# Patient Record
Sex: Female | Born: 1950
Health system: Southern US, Community
[De-identification: ages and names within clinical notes are randomized; demographics above are authoritative.]

## PROBLEM LIST (undated history)

## (undated) DIAGNOSIS — Z800 Family history of malignant neoplasm of digestive organs: Secondary | ICD-10-CM

## (undated) DIAGNOSIS — K219 Gastro-esophageal reflux disease without esophagitis: Secondary | ICD-10-CM

## (undated) DIAGNOSIS — J45909 Unspecified asthma, uncomplicated: Secondary | ICD-10-CM

## (undated) DIAGNOSIS — B029 Zoster without complications: Secondary | ICD-10-CM

## (undated) DIAGNOSIS — Z801 Family history of malignant neoplasm of trachea, bronchus and lung: Secondary | ICD-10-CM

## (undated) DIAGNOSIS — F329 Major depressive disorder, single episode, unspecified: Secondary | ICD-10-CM

## (undated) DIAGNOSIS — C801 Malignant (primary) neoplasm, unspecified: Secondary | ICD-10-CM

## (undated) DIAGNOSIS — Z90710 Acquired absence of both cervix and uterus: Secondary | ICD-10-CM

## (undated) DIAGNOSIS — Z9049 Acquired absence of other specified parts of digestive tract: Secondary | ICD-10-CM

## (undated) DIAGNOSIS — R0600 Dyspnea, unspecified: Secondary | ICD-10-CM

## (undated) DIAGNOSIS — E785 Hyperlipidemia, unspecified: Secondary | ICD-10-CM

## (undated) DIAGNOSIS — M791 Myalgia, unspecified site: Secondary | ICD-10-CM

## (undated) DIAGNOSIS — R5383 Other fatigue: Secondary | ICD-10-CM

## (undated) DIAGNOSIS — I10 Essential (primary) hypertension: Secondary | ICD-10-CM

## (undated) DIAGNOSIS — F32A Depression, unspecified: Secondary | ICD-10-CM

## (undated) HISTORY — DX: Myalgia, unspecified site: M79.10

## (undated) HISTORY — DX: Hyperlipidemia, unspecified: E78.5

## (undated) HISTORY — DX: Major depressive disorder, single episode, unspecified: F32.9

## (undated) HISTORY — DX: Acquired absence of other specified parts of digestive tract: Z90.49

## (undated) HISTORY — DX: Depression, unspecified: F32.A

## (undated) HISTORY — PX: DIAGNOSTIC LAPAROSCOPY: SUR761

## (undated) HISTORY — DX: Essential (primary) hypertension: I10

## (undated) HISTORY — DX: Family history of malignant neoplasm of trachea, bronchus and lung: Z80.1

## (undated) HISTORY — PX: PARACENTESIS: SHX844

## (undated) HISTORY — DX: Gastro-esophageal reflux disease without esophagitis: K21.9

## (undated) HISTORY — DX: Zoster without complications: B02.9

## (undated) HISTORY — DX: Family history of malignant neoplasm of digestive organs: Z80.0

## (undated) HISTORY — PX: TONSILLECTOMY AND ADENOIDECTOMY: SUR1326

## (undated) HISTORY — DX: Other fatigue: R53.83

## (undated) HISTORY — PX: JOINT REPLACEMENT: SHX530

## (undated) HISTORY — DX: Acquired absence of both cervix and uterus: Z90.710

## (undated) HISTORY — PX: LAPAROSCOPIC CHOLECYSTECTOMY: SUR755

## (undated) HISTORY — PX: PARTIAL HYSTERECTOMY: SHX80

---

## 2004-09-28 ENCOUNTER — Ambulatory Visit: Payer: Self-pay

## 2004-12-16 ENCOUNTER — Ambulatory Visit: Payer: Self-pay | Admitting: Surgery

## 2005-12-21 ENCOUNTER — Ambulatory Visit: Payer: Self-pay

## 2006-01-25 ENCOUNTER — Ambulatory Visit: Payer: Self-pay | Admitting: Gastroenterology

## 2008-08-28 ENCOUNTER — Ambulatory Visit: Payer: Self-pay | Admitting: Family Medicine

## 2008-12-02 ENCOUNTER — Encounter: Payer: Self-pay | Admitting: Cardiology

## 2008-12-04 ENCOUNTER — Ambulatory Visit: Payer: Self-pay | Admitting: Cardiology

## 2008-12-04 ENCOUNTER — Encounter: Payer: Self-pay | Admitting: Cardiology

## 2008-12-04 DIAGNOSIS — R9431 Abnormal electrocardiogram [ECG] [EKG]: Secondary | ICD-10-CM | POA: Insufficient documentation

## 2008-12-11 ENCOUNTER — Encounter: Payer: Self-pay | Admitting: Cardiology

## 2008-12-11 ENCOUNTER — Ambulatory Visit (HOSPITAL_COMMUNITY): Admission: RE | Admit: 2008-12-11 | Discharge: 2008-12-11 | Payer: Self-pay | Admitting: Cardiology

## 2009-10-01 ENCOUNTER — Ambulatory Visit: Payer: Self-pay | Admitting: Family Medicine

## 2010-11-05 ENCOUNTER — Encounter: Payer: Self-pay | Admitting: Cardiology

## 2011-03-25 ENCOUNTER — Ambulatory Visit: Payer: Self-pay | Admitting: Family Medicine

## 2011-04-20 ENCOUNTER — Ambulatory Visit: Payer: Self-pay | Admitting: Internal Medicine

## 2011-05-18 HISTORY — PX: PARATHYROIDECTOMY: SHX19

## 2011-10-27 ENCOUNTER — Observation Stay: Payer: Self-pay | Admitting: Internal Medicine

## 2011-10-27 DIAGNOSIS — I200 Unstable angina: Secondary | ICD-10-CM

## 2011-10-27 LAB — COMPREHENSIVE METABOLIC PANEL
Albumin: 3.9 g/dL (ref 3.4–5.0)
Alkaline Phosphatase: 102 U/L (ref 50–136)
Anion Gap: 6 — ABNORMAL LOW (ref 7–16)
BUN: 15 mg/dL (ref 7–18)
Calcium, Total: 9.3 mg/dL (ref 8.5–10.1)
Chloride: 105 mmol/L (ref 98–107)
Creatinine: 1.07 mg/dL (ref 0.60–1.30)
EGFR (Non-African Amer.): 57 — ABNORMAL LOW
SGPT (ALT): 29 U/L
Sodium: 137 mmol/L (ref 136–145)

## 2011-10-27 LAB — CBC
HCT: 39.5 % (ref 35.0–47.0)
MCH: 30.2 pg (ref 26.0–34.0)
MCV: 94 fL (ref 80–100)
WBC: 8.1 10*3/uL (ref 3.6–11.0)

## 2011-10-27 LAB — APTT: Activated PTT: 27.5 secs (ref 23.6–35.9)

## 2011-10-27 LAB — CK TOTAL AND CKMB (NOT AT ARMC)
CK, Total: 92 U/L (ref 21–215)
CK-MB: 1 ng/mL (ref 0.5–3.6)
CK-MB: 1.1 ng/mL (ref 0.5–3.6)

## 2012-03-29 ENCOUNTER — Ambulatory Visit: Payer: Self-pay | Admitting: Family Medicine

## 2013-06-25 ENCOUNTER — Encounter: Payer: Self-pay | Admitting: Emergency Medicine

## 2013-06-25 ENCOUNTER — Emergency Department
Admission: EM | Admit: 2013-06-25 | Discharge: 2013-06-25 | Disposition: A | Discharge status: Home / Self Care | Payer: 59 | Source: Home / Self Care | Attending: Family Medicine | Admitting: Family Medicine

## 2013-06-25 ENCOUNTER — Emergency Department (INDEPENDENT_AMBULATORY_CARE_PROVIDER_SITE_OTHER): Payer: 59

## 2013-06-25 DIAGNOSIS — R053 Chronic cough: Secondary | ICD-10-CM

## 2013-06-25 DIAGNOSIS — R05 Cough: Secondary | ICD-10-CM

## 2013-06-25 DIAGNOSIS — R059 Cough, unspecified: Secondary | ICD-10-CM

## 2013-06-25 DIAGNOSIS — R509 Fever, unspecified: Secondary | ICD-10-CM

## 2013-06-25 LAB — POCT INFLUENZA A/B
INFLUENZA A, POC: NEGATIVE
INFLUENZA B, POC: NEGATIVE

## 2013-06-25 MED ORDER — DOXYCYCLINE HYCLATE 100 MG PO CAPS: 100.0000 mg | ORAL_CAPSULE | Freq: Two times a day (BID) | ORAL | Status: DC

## 2013-06-25 MED ORDER — GUAIFENESIN-CODEINE 100-10 MG/5ML PO SOLN: ORAL | Status: DC

## 2013-06-25 NOTE — ED Notes (Signed)
Connie Heath c/o non-productive cough, congestion, ear fullness  X 4 days. Unsure if she's had a fever. Rec'd flu vac this season.

## 2013-06-25 NOTE — ED Provider Notes (Signed)
CSN: [id number]     Arrival date & time 06/25/13  0900 History   First MD Initiated Contact with Patient 06/25/13 [phone]     Chief Complaint  Patient presents with  . Cough  . Nasal Congestion        HPI Comments: Patient complains of onset of flu-like symptoms 5 days ago with cough, chest congestion, fatigue, chills, and myalgias.  The symptoms continued and became worse over the past 5 days.  Her cough is now non-productive and she continues to have chills.  She often coughs until she gags.   Her Tdap is current.  The history is provided by the patient.    Past Medical History  Diagnosis Date  . Hypertension   . Depression   . Hyperlipidemia     H/o myalgias with Vytorin but tolerating simvastatin  . GERD (gastroesophageal reflux disease)   . Myalgia     Hx of   . Fatigue   . S/P cholecystectomy   . S/P hysterectomy    Past Surgical History  Procedure Laterality Date  . Tonsillectomy and adenoidectomy    . Partial hysterectomy    . Laparoscopic cholecystectomy     Family History  Problem Relation Age of Onset  . Lung cancer Mother   . Cancer Father     Stomach  . Heart failure Maternal Grandmother 80    CHF  . Heart failure Maternal Grandfather 50    Acute MI   History  Substance Use Topics  . Smoking status: Never Smoker   . Smokeless tobacco: Never Used  . Alcohol Use: Yes     Comment: Occasional   OB History   Grav Para Term Preterm Abortions TAB SAB Ect Mult Living                 Review of Systems + sore throat + cough + hoarseness No pleuritic pain, but has tightness in anterior chest. No wheezing + nasal congestion + post-nasal drainage No sinus pain/pressure No itchy/red eyes ? earache No hemoptysis No SOB ? fever, + chills/sweats No nausea No vomiting No abdominal pain No diarrhea No urinary symptoms No skin rash + fatigue + myalgias + headache Used OTC meds without relief  Allergies  Bee venom and Iodine  Home Medications    Current Outpatient Rx  Name  Route  Sig  Dispense  Refill  . FLUoxetine (PROZAC) 20 MG capsule   Oral   Take 20 mg by mouth daily.           . metoprolol succinate (TOPROL-XL) 25 MG 24 hr tablet   Oral   Take 25 mg by mouth daily.         . simvastatin (ZOCOR) 40 MG tablet   Oral   Take 40 mg by mouth at bedtime.           Marland Kitchen doxycycline (VIBRAMYCIN) 100 MG capsule   Oral   Take 1 capsule (100 mg total) by mouth 2 (two) times daily.   20 capsule   0   . Grape Seed Extract 50 MG CAPS   Oral   Take 500 mg by mouth daily.           Marland Kitchen guaiFENesin-codeine 100-10 MG/5ML syrup      Take 42mL by mouth at bedtime as needed for cough   120 mL   0   . hydrochlorothiazide (HYDRODIURIL) 12.5 MG tablet   Oral   Take 12.5 mg by mouth daily.           Marland Kitchen  Multiple Vitamins-Minerals (WOMENS DAILY FORM/FA/CA/FE) TABS   Oral   Take 1 tablet by mouth daily.           Marland Kitchen omeprazole (PRILOSEC) 20 MG capsule   Oral   Take 20 mg by mouth daily.            BP 107/73  Pulse 108  Temp(Src) 98.3 F (36.8 C) (Oral)  Resp 16  Ht 5\' 7"  (1.702 m)  Wt 207 lb (93.895 kg)  BMI 32.41 kg/m2  SpO2 99% Physical Exam Nursing notes and Vital Signs reviewed. Appearance:  Patient appears stated age, and in no acute distress.  Patient is obese (BMI 32.4) Eyes:  Pupils are equal, round, and reactive to light and accomodation.  Extraocular movement is intact.  Conjunctivae are not inflamed  Ears:  Canals normal.  Tympanic membranes normal.  Nose:  Mildly congested turbinates.  No sinus tenderness.   Pharynx:  Normal Neck:  Supple.  Tender enlarged posterior nodes are palpated bilaterally  Lungs:  Clear to auscultation.  Breath sounds are equal.  Chest:  Distinct tenderness to palpation over the mid-sternum.  Heart:  Regular rate and rhythm without murmurs, rubs, or gallops.  Abdomen:  Nontender without masses or hepatosplenomegaly.  Bowel sounds are present.  No CVA or flank  tenderness.  Extremities:  No edema.  No calf tenderness Skin:  No rash present.   ED Course  Procedures  none    Labs Reviewed  POCT INFLUENZA A/B Negative   Imaging Review Dg Chest 2 View  06/25/2013   CLINICAL DATA:  Cough, congestion  EXAM: CHEST  2 VIEW  COMPARISON:  CT CORONARY CA SCORING W/O CA SCORE dated 12/11/2008  FINDINGS: The heart size and mediastinal contours are within normal limits. Both lungs are clear. The visualized skeletal structures are unremarkable.  IMPRESSION: No active cardiopulmonary disease.   Electronically Signed   By: Kathreen Devoid   On: 06/25/2013 10:19      MDM   1. Persistent cough; suspect atypical agent    Begin doxycycline.  Robitussin AC at bedtime. Take plain Mucinex (1200 mg guaifenesin) twice daily for cough and congestion.  Increase fluid intake, rest. May use Afrin nasal spray (or generic oxymetazoline) twice daily for about 5 days.  Also recommend using saline nasal spray several times daily and saline nasal irrigation (AYR is a common brand) Try warm salt water gargles for sore throat.  Stop all antihistamines for now, and other non-prescription cough/cold preparations.   Follow-up with family doctor if not improving 7 to 10 days.     Kandra Nicolas, MD 06/25/13 1218

## 2013-06-25 NOTE — Discharge Instructions (Signed)
Take plain Mucinex (1200 mg guaifenesin) twice daily for cough and congestion.  Increase fluid intake, rest. °May use Afrin nasal spray (or generic oxymetazoline) twice daily for about 5 days.  Also recommend using saline nasal spray several times daily and saline nasal irrigation (AYR is a common brand) °Try warm salt water gargles for sore throat.  °Stop all antihistamines for now, and other non-prescription cough/cold preparations. °  °Follow-up with family doctor if not improving 7 to 10 days.  °

## 2013-09-28 LAB — BASIC METABOLIC PANEL
CREATININE: 1 mg/dL (ref 0.5–1.1)
GLUCOSE: 98 mg/dL
POTASSIUM: 5.2 mmol/L (ref 3.4–5.3)

## 2013-09-28 LAB — HEPATIC FUNCTION PANEL
ALT: 21 U/L (ref 7–35)
AST: 20 U/L (ref 13–35)
Alkaline Phosphatase: 75 U/L (ref 25–125)

## 2013-09-28 LAB — LIPID PANEL
Cholesterol: 218 mg/dL — AB (ref 0–200)
HDL: 39 mg/dL (ref 35–70)
LDL Cholesterol: 153 mg/dL
TRIGLYCERIDES: 128 mg/dL (ref 40–160)

## 2013-09-28 LAB — CBC AND DIFFERENTIAL
Hemoglobin: 12.9 g/dL (ref 12.0–16.0)
Platelets: 319 10*3/uL (ref 150–399)
WBC: 7.7 10*3/mL

## 2013-09-28 LAB — VITAMIN D 25 HYDROXY (VIT D DEFICIENCY, FRACTURES)
CALCIUM: 10
Vit D, 25-Hydroxy: 22

## 2013-09-28 LAB — TSH: TSH: 2.58 u[IU]/mL (ref 0.41–5.90)

## 2013-09-28 LAB — HEMOGLOBIN A1C: HEMOGLOBIN A1C: 6.1 % — AB (ref 4.0–6.0)

## 2013-10-04 ENCOUNTER — Ambulatory Visit: Payer: Self-pay | Admitting: Family Medicine

## 2014-07-15 ENCOUNTER — Encounter: Payer: Self-pay | Admitting: *Deleted

## 2014-07-15 ENCOUNTER — Emergency Department
Admission: EM | Admit: 2014-07-15 | Discharge: 2014-07-15 | Disposition: A | Discharge status: Home / Self Care | Payer: 59 | Source: Home / Self Care | Attending: Family Medicine | Admitting: Family Medicine

## 2014-07-15 DIAGNOSIS — I10 Essential (primary) hypertension: Secondary | ICD-10-CM

## 2014-07-15 DIAGNOSIS — E785 Hyperlipidemia, unspecified: Secondary | ICD-10-CM

## 2014-07-15 LAB — POCT URINALYSIS DIPSTICK
Bilirubin, UA: NEGATIVE
Blood, UA: NEGATIVE
Glucose, UA: NEGATIVE
Ketones, UA: NEGATIVE
LEUKOCYTES UA: NEGATIVE
Nitrite, UA: NEGATIVE
PROTEIN UA: NEGATIVE
Spec Grav, UA: 1.02 (ref 1.005–1.03)
UROBILINOGEN UA: 0.2 (ref 0–1)
pH, UA: 5.5 (ref 5–8)

## 2014-07-15 MED ORDER — PROPRANOLOL HCL ER 80 MG PO CP24: 80.0000 mg | ORAL_CAPSULE | Freq: Every day | ORAL | Status: DC

## 2014-07-15 NOTE — ED Notes (Signed)
Connie Heath reports BP of 165/110 today, she is asymptomatic other than her heart racing at times. She has been off of BP meds for 4 months. She cannot get in with her PCP as she is too far away. She is interested in establishing here in [place]. Also c/o alopecia and dry skin.

## 2014-07-15 NOTE — ED Provider Notes (Signed)
CSN: [id number]     Arrival date & time 07/15/14  1323 History   First MD Initiated Contact with Patient 07/15/14 1421     Chief Complaint  Patient presents with  . Hypertension      HPI Comments: Patient reports that her BP today was 165/110.  She is otherwise assymptomatic at present other than occasional heart racing without chest pain or shortness of breath.  She has not taken anti-hypertensive medication for four months. She reports that in the past Lisinopril caused a cough, Toprol caused dyspnea, and an ARB caused bladder pain.  She states that she had take Inderal in the past without adverse effects.  Patient is a 64 y.o. female presenting with hypertension. The history is provided by the patient.  Hypertension The problem has been gradually worsening. Pertinent negatives include no chest pain, no abdominal pain, no headaches and no shortness of breath. The symptoms are aggravated by stress. The symptoms are relieved by medications. She has tried nothing for the symptoms.    Past Medical History  Diagnosis Date  . Hypertension   . Depression   . Hyperlipidemia     H/o myalgias with Vytorin but tolerating simvastatin  . GERD (gastroesophageal reflux disease)   . Myalgia     Hx of   . Fatigue   . S/P cholecystectomy   . S/P hysterectomy    Past Surgical History  Procedure Laterality Date  . Tonsillectomy and adenoidectomy    . Partial hysterectomy    . Laparoscopic cholecystectomy     Family History  Problem Relation Age of Onset  . Lung cancer Mother   . Cancer Father     Stomach  . Heart failure Maternal Grandmother 80    CHF  . Heart failure Maternal Grandfather 50    Acute MI   History  Substance Use Topics  . Smoking status: Never Smoker   . Smokeless tobacco: Never Used  . Alcohol Use: Yes     Comment: Occasional   OB History    No data available     Review of Systems  Constitutional: Negative.   HENT: Negative.   Eyes: Negative.   Respiratory:  Negative for cough, chest tightness and shortness of breath.   Cardiovascular: Positive for palpitations. Negative for chest pain and leg swelling.  Gastrointestinal: Negative.  Negative for abdominal pain.  Endocrine: Negative.   Genitourinary: Negative.   Musculoskeletal: Negative.   Skin: Negative.   Neurological: Negative for headaches.    Allergies  Bee venom and Iodine  Home Medications   Prior to Admission medications   Medication Sig Start Date End Date Taking? Authorizing Provider  FLUoxetine (PROZAC) 20 MG capsule Take 20 mg by mouth daily.     Yes Historical Provider, MD  Multiple Vitamins-Minerals (WOMENS DAILY FORM/FA/CA/FE) TABS Take 1 tablet by mouth daily.     Yes Historical Provider, MD  simvastatin (ZOCOR) 40 MG tablet Take 40 mg by mouth at bedtime.     Yes Historical Provider, MD  Grape Seed Extract 50 MG CAPS Take 500 mg by mouth daily.      Historical Provider, MD  propranolol ER (INDERAL LA) 80 MG 24 hr capsule Take 1 capsule (80 mg total) by mouth daily. 07/15/14   Kandra Nicolas, MD   BP 134/95 mmHg  Pulse 96  Resp 16  Wt 217 lb (98.431 kg)  SpO2 97% Physical Exam Nursing notes and Vital Signs reviewed. Appearance:  Patient appears stated age, and in  no acute distress.  Patient is obese. Eyes:  Pupils are equal, round, and reactive to light and accomodation.  Extraocular movement is intact.  Conjunctivae are not inflamed.  Fundi benign  Ears:  Canals normal.  Tympanic membranes normal.  Nose:  Normal turbinates.  No sinus tenderness.   Pharynx:  Normal Neck:  Supple.  No adenopathy or thyromegaly.  Carotids have normal upstrokes without bruits. Lungs:  Clear to auscultation.  Breath sounds are equal.  Heart:  Regular rate and rhythm without murmurs, rubs, or gallops.  Abdomen:  Nontender without masses or hepatosplenomegaly.  Bowel sounds are present.  No CVA or flank tenderness.  Extremities:  No edema.  No calf tenderness Skin:  No rash present.    ED Course  Procedures  None    Labs Reviewed  COMPLETE METABOLIC PANEL WITH GFR  TSH  LIPID PANEL  POCT URINALYSIS DIPSTICK:  negative  POCT CBC W AUTO DIFF ([facility])      MDM   1. Essential hypertension   2. Hyperlipidemia    Begin Inderal LA 80, one daily. Check screening labs:  CMP, urinalysis, CBC, TSH, lipid panel (patient fasting) Followup with Family Doctor in about 10 days.    Kandra Nicolas, MD 07/18/14 2215

## 2014-07-15 NOTE — Discharge Instructions (Signed)
Check Blood Pressure daily and record.

## 2014-07-16 LAB — CBC WITH DIFFERENTIAL/PLATELET
Basophils Absolute: 0.1 10*3/uL (ref 0.0–0.1)
Basophils Relative: 1 % (ref 0–1)
EOS ABS: 0.3 10*3/uL (ref 0.0–0.7)
EOS PCT: 3 % (ref 0–5)
HEMATOCRIT: 39.4 % (ref 36.0–46.0)
HEMOGLOBIN: 13.3 g/dL (ref 12.0–15.0)
LYMPHS ABS: 3.3 10*3/uL (ref 0.7–4.0)
Lymphocytes Relative: 37 % (ref 12–46)
MCH: 30.2 pg (ref 26.0–34.0)
MCHC: 33.8 g/dL (ref 30.0–36.0)
MCV: 89.5 fL (ref 78.0–100.0)
MONOS PCT: 9 % (ref 3–12)
MPV: 10.4 fL (ref 8.6–12.4)
Monocytes Absolute: 0.8 10*3/uL (ref 0.1–1.0)
Neutro Abs: 4.5 10*3/uL (ref 1.7–7.7)
Neutrophils Relative %: 50 % (ref 43–77)
Platelets: 383 10*3/uL (ref 150–400)
RBC: 4.4 MIL/uL (ref 3.87–5.11)
RDW: 13.8 % (ref 11.5–15.5)
WBC: 8.9 10*3/uL (ref 4.0–10.5)

## 2014-07-16 LAB — COMPLETE METABOLIC PANEL WITH GFR
ALBUMIN: 4.5 g/dL (ref 3.5–5.2)
ALK PHOS: 74 U/L (ref 39–117)
ALT: 33 U/L (ref 0–35)
AST: 29 U/L (ref 0–37)
BUN: 11 mg/dL (ref 6–23)
CO2: 28 mEq/L (ref 19–32)
Calcium: 10.6 mg/dL — ABNORMAL HIGH (ref 8.4–10.5)
Chloride: 99 mEq/L (ref 96–112)
Creat: 1.01 mg/dL (ref 0.50–1.10)
GFR, Est African American: 69 mL/min
GFR, Est Non African American: 60 mL/min
GLUCOSE: 90 mg/dL (ref 70–99)
POTASSIUM: 4.2 meq/L (ref 3.5–5.3)
SODIUM: 137 meq/L (ref 135–145)
TOTAL PROTEIN: 7.6 g/dL (ref 6.0–8.3)
Total Bilirubin: 0.5 mg/dL (ref 0.2–1.2)

## 2014-07-16 LAB — LIPID PANEL
CHOLESTEROL: 167 mg/dL (ref 0–200)
HDL: 40 mg/dL — AB (ref >=46)
LDL Cholesterol: 105 mg/dL — ABNORMAL HIGH (ref 0–99)
Total CHOL/HDL Ratio: 4.2 Ratio
Triglycerides: 112 mg/dL (ref <150)
VLDL: 22 mg/dL (ref 0–40)

## 2014-07-16 LAB — TSH: TSH: 2.356 u[IU]/mL (ref 0.350–4.500)

## 2014-07-17 ENCOUNTER — Telehealth: Payer: Self-pay | Admitting: Emergency Medicine

## 2014-07-19 ENCOUNTER — Ambulatory Visit (INDEPENDENT_AMBULATORY_CARE_PROVIDER_SITE_OTHER): Payer: 59 | Admitting: Family Medicine

## 2014-07-19 ENCOUNTER — Encounter: Payer: Self-pay | Admitting: Family Medicine

## 2014-07-19 VITALS — BP 141/83 | HR 65 | Ht 67.0 in | Wt 220.0 lb

## 2014-07-19 DIAGNOSIS — I10 Essential (primary) hypertension: Secondary | ICD-10-CM | POA: Insufficient documentation

## 2014-07-19 DIAGNOSIS — F32A Depression, unspecified: Secondary | ICD-10-CM | POA: Insufficient documentation

## 2014-07-19 DIAGNOSIS — F329 Major depressive disorder, single episode, unspecified: Secondary | ICD-10-CM

## 2014-07-19 DIAGNOSIS — E785 Hyperlipidemia, unspecified: Secondary | ICD-10-CM | POA: Insufficient documentation

## 2014-07-19 MED ORDER — AMLODIPINE BESYLATE 10 MG PO TABS: ORAL_TABLET | ORAL | Status: DC

## 2014-07-19 NOTE — Progress Notes (Signed)
CC: Connie Heath is a 64 y.o. female is here for Establish Care and Hypertension   Subjective: HPI:  Very pleasant 64 year old here to establish care. 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 It's only been present and problematic during periods of stress. For the majority of flash tear she had not required any antihypertensive medications until she had a new possible at her current job who is constantly causing her irritability and stress. Blood pressure seems to be directly correlated to the degree of stress that she is under. In the past she's been on propranolol for headaches and she was started on this last week but it has not helped her blood pressure at all. She's also been on lisinopril that caused a cough and a ARB that caused intolerable bladder pain.  TSH and renal function were normal on blood work last week.  She denies any cardiovascular disease. She tries to watch her salt intake and tries to stay active.  History of hyperlipidemia currently taking simvastatin on a daily basis. LDL was checked last week and was only mildly elevated. She denies any right upper quadrant pain or myalgias recently or remotely.  Reports a history of depression that stems back into the 1980s. Within 2 years she lost her mother and her father and also was divorced by her former husband after he cheated on her with her best friend. Since then she's been taking Prozac on a daily basis with no depressive symptoms. Symptoms occurred so longer she has trouble remembering exactly how she was feeling other than just "depressed ". There has been no thoughts when on herself or others.  On blood work last week she had a mildly elevated calcium level. She tells me that for as long as she remembers she's always had an elevated calcium however ionized calcium has always been normal. She is uncertain of her parathyroid hormone has  ever been checked. She denies any history of kidney stones, chronic abdominal pain nor chronic joint or bone pain.  Review of Systems - General ROS: negative for - chills, fever, night sweats, weight gain or weight loss Ophthalmic ROS: negative for - decreased vision Psychological ROS: negative for -  depression ENT ROS: negative for - hearing change, nasal congestion, tinnitus or allergies Hematological and Lymphatic ROS: negative for - bleeding problems, bruising or swollen lymph nodes Breast ROS: negative Respiratory ROS: no cough, shortness of breath, or wheezing Cardiovascular ROS: no chest pain or dyspnea on exertion Gastrointestinal ROS: no abdominal pain, change in bowel habits, or black or bloody stools Genito-Urinary ROS: negative for - genital discharge, genital ulcers, incontinence or abnormal bleeding from genitals Musculoskeletal ROS: negative for - joint pain or muscle pain Neurological ROS: negative for - headaches or memory loss Dermatological ROS: negative for lumps, mole changes, rash and skin lesion changes  Past Medical History  Diagnosis Date  . Hypertension   . Depression   . Hyperlipidemia     H/o myalgias with Vytorin but tolerating simvastatin  . GERD (gastroesophageal reflux disease)   . Myalgia     Hx of   . Fatigue   . S/P cholecystectomy   . S/P hysterectomy     Past Surgical History  Procedure Laterality Date  . Tonsillectomy and adenoidectomy    . Partial hysterectomy    . Laparoscopic cholecystectomy     Family History  Problem Relation Age of Onset  .  Lung cancer Mother   . Cancer Father     Stomach  . Heart failure Maternal Grandmother 80    CHF  . Heart failure Maternal Grandfather 50    Acute MI    History   Social History  . Marital Status: Married    Spouse Name: N/A  . Number of Children: N/A  . Years of Education: N/A   Occupational History  . 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 Smoking status: Never Smoker   . Smokeless tobacco: Never Used  . Alcohol Use: Yes     Comment: Occasional  . Drug Use: No  . Sexual Activity: Not on file   Other Topics Concern  . Not on file   Social History Narrative   Married   Gets regular exercise     Objective: BP 141/83 mmHg  Pulse 65  Ht 5\' 7"  (1.702 m)  Wt 220 lb (99.791 kg)  BMI 34.45 kg/m2  General: Alert and Oriented, No Acute Distress HEENT: Pupils equal, round, reactive to light. Conjunctivae clear.  Motion of his membranes Unremarkable Lungs: Clear to auscultation bilaterally, no wheezing/ronchi/rales.  Comfortable work of breathing. Good air movement. Cardiac: Regular rate and rhythm. Normal S1/S2.  No murmurs, rubs, nor gallops.   Abdomen: Mild obesity Extremities: No peripheral edema.  Strong peripheral pulses.  Mental Status: No depression, anxiety, nor agitation. Skin: Warm and dry.  Assessment & Plan: Connie Heath was seen today for establish care and hypertension.  Diagnoses and all orders for this visit:  Essential hypertension Orders: -     amLODipine (NORVASC) 10 MG tablet; Half tablet by mouth every day for blood pressure control.  Hyperlipidemia  Hypercalcemia  Depression   Essential hypertension: Uncontrolled chronic condition, discussed starting Norvasc at a dose of 5 mg daily. 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 After one week of 5 mg daily as her blood pressures above 140/90 she understands to increase to a total of 10 mg daily. After a week of this if blood pressure still above 140/90 she should return to our office otherwise she can follow-up in 3 months. Hyperlipidemia: Borderline elevated overall controlled continue simvastatin Hypercalcemia: Requesting outside records to see if parathyroid hormone has ever been checked Depression: Controlled on Prozac.  Return in about 3 months (around 10/19/2014) for Blood Pressure Check.

## 2014-08-08 ENCOUNTER — Encounter: Payer: Self-pay | Admitting: Family Medicine

## 2014-08-08 DIAGNOSIS — R7303 Prediabetes: Secondary | ICD-10-CM | POA: Insufficient documentation

## 2014-08-08 DIAGNOSIS — R41840 Attention and concentration deficit: Secondary | ICD-10-CM | POA: Insufficient documentation

## 2014-08-08 DIAGNOSIS — Z800 Family history of malignant neoplasm of digestive organs: Secondary | ICD-10-CM | POA: Insufficient documentation

## 2014-08-08 DIAGNOSIS — G4700 Insomnia, unspecified: Secondary | ICD-10-CM | POA: Insufficient documentation

## 2014-08-30 ENCOUNTER — Encounter: Payer: Self-pay | Admitting: Family Medicine

## 2014-09-08 NOTE — Discharge Summary (Signed)
PATIENT NAME:  Connie Heath, Connie Heath MR#:  [id number] DATE OF BIRTH:  [birth year]  DATE OF ADMISSION:  10/27/2011 DATE OF DISCHARGE:  10/27/2011  ADMITTING PHYSICIAN: Gladstone Lighter, MD   DISCHARGING PHYSICIAN: Gladstone Lighter, MD    PRIMARY MD: Steele Sizer, MD    DISCHARGE DIAGNOSES:  1. Chest pain, could be stress versus angina. The patient will have Myoview adenosine as outpatient.  2. Hypertension.  3. Hyperlipidemia.   DISCHARGE HOME MEDICATIONS:  1. Losartan 50 mg p.o. daily.  2. Simvastatin 20 mg p.o. every other day.  3. Aspirin 325 mg p.o. daily.  4. Metoprolol 12.5 mg p.o. b.i.d.   DISCHARGE DIET: Low sodium diet.   DISCHARGE ACTIVITY: As tolerated.   FOLLOW-UP INSTRUCTIONS:  1. PCP follow-up in two weeks.  2. Myoview scheduled at [facility] on 10/29/2011 at 9 a.m.   LABS AND IMAGING STUDIES WHILE IN THE HOSPITAL: WBC 8.1, hemoglobin 12.8, hematocrit 39.5, platelet count 317, sodium 137, potassium 4.1, chloride 105, bicarb 26, BUN 15, creatinine 1.07, glucose 111, calcium 9.3, ALT 29, AST 21, alkaline phosphatase 102, total bilirubin 0.3, albumin 3.9. D-dimer is less than 0.22. Cardiac enzymes x3 sets have been negative.   Chest x-ray showing clear lung fields, hyperinflated with COPD but no acute cardiopulmonary changes.   Echo Doppler showing normal study with EF greater than 55%, mild impaired LV relaxation present. Right-sided pressures are slightly elevated at 30 to 40 mmHg.   BRIEF HOSPITAL COURSE: Ms. Reichart is a 64 year old female with past medical history significant for hypertension and hyperlipidemia who presented to the Emergency Room complaining of chest pain. Chest pain was atypical. She also mentioned that she was under a lot of stress lately. Her pain improved with aspirin and also nitroglycerin. She was admitted for observation. The patient has been chest pain free since admission. She had an echo done which did not show any wall motion abnormalities and the  patient wanted to go home because she was asymptomatic. We scheduled an outpatient stress test in two days for 10/29/2011 and if that's abnormal she will be contacted. She has been otherwise asymptomatic while in the hospital. All of her home medications were continued without any changes. Only metoprolol 12.5 mg b.i.d. was added because heart rate was not well controlled and her blood pressure was slightly on the higher side. Her course has been otherwise uneventful.   DISCHARGE CONDITION: Stable.   DISCHARGE DISPOSITION: Home.   TIME SPENT ON DISCHARGE: 45 minutes.   ____________________________ Gladstone Lighter, MD [id number] D: 10/29/2011 15:17:29 ET T: 10/30/2011 16:12:32 ET JOB#: [id number]  cc: Gladstone Lighter, MD, <Dictator> Bethena Roys. Ancil Boozer, MD Gladstone Lighter MD ELECTRONICALLY SIGNED 11/10/2011 15:22

## 2014-09-08 NOTE — H&P (Signed)
PATIENT NAME:  Connie Heath, Connie Heath MR#:  [id number] DATE OF BIRTH:  1950/08/04  DATE OF ADMISSION:  10/27/2011   ADMITTING PHYSICIAN: Gladstone Lighter, MD   PRIMARY MD: Steele Sizer, MD    CHIEF COMPLAINT: Chest pain.   HISTORY OF PRESENT ILLNESS: Connie Heath is a 64 year old Caucasian female with past medical history significant for hypertension and hyperlipidemia who comes in with chest pain. The patient says she has been under a lot of stress lately over the last couple of months but never had chest pain lately. She never had prior cardiac history and no family history of heart disease that she is aware of. She said she woke up around 4 a.m. this morning and had chest pain. She works as a Marine scientist so she took two pills of 325 mg of aspirin which did not relieve it. She waited until 7 a.m. and it did not get better and she called an ambulance. She got 2 pills of nitroglycerin sublingual and says that eased her chest pain. Chest pain was squeezing in nature, 5 to 10 in intensity without any nausea, vomiting, shortness of breath, or radiation. Currently on my examination the patient is chest pain free. Blood pressure is improved as it was elevated when she came in and first set of cardiac enzymes are negative.   PAST MEDICAL HISTORY:  1. Hypertension.  2. Hyperlipidemia.   PAST SURGICAL HISTORY:  1. Hysterectomy.  2. Cholecystectomy.  3. Tonsillectomy and adenoidectomy.  4. Meniscal tear repair.   ALLERGIES TO MEDICATIONS: Contrast dye.   MEDICATIONS AT HOME: 1. Simvastatin every other day. 2. Losartan 50 mg p.o. daily.  3. Aspirin 325 mg p.o. daily.   SOCIAL HISTORY: Lives at home with husband. No history of any alcohol or smoking. Uses red wine occasionally.   FAMILY HISTORY: Father died from stomach cancer in his 62's. Mom with lung cancer in her 25's. Father's sister had colon cancer. No history of heart disease in the family. The patient does not have any siblings.   REVIEW OF  SYSTEMS: CONSTITUTIONAL: No fever, fatigue, or weakness. EYES: No blurred vision, double vision, pain, redness, inflammation, or glaucoma. ENT: No tinnitus, ear pain, hearing loss, epistaxis, or discharge. RESPIRATORY: Positive for chronic cough secondary to ACE inhibitors, improved and restarted again. No wheeze, COPD, or hemoptysis. CARDIOVASCULAR: Improved chest pain now. No orthopnea, edema, arrhythmia, palpitations, or syncope. GI: No nausea, vomiting, diarrhea, abdominal pain, hematemesis, or melena. GU: No dysuria, hematuria, renal calculus, frequency, or incontinence. ENDOCRINE: No polyuria, nocturia, thyroid problems, heat or cold intolerance. HEMATOLOGY: No anemia, easy bruising or bleeding. SKIN: No acne, rash, or lesions. MUSCULOSKELETAL: No neck, back, shoulder pain, arthritis, or gout. NEUROLOGIC: No numbness, weakness, CVA, TIA, or seizures. PSYCHOLOGICAL: No anxiety, insomnia, or depression.   PHYSICAL EXAMINATION:   VITAL SIGNS: Temperature 97.5 degrees Fahrenheit, pulse 104, respirations 18, blood pressure 189/101, pulse oximetry 98% on room air.   GENERAL: Well built, well nourished female lying in bed not in any acute distress.   HEENT: Normocephalic, atraumatic. Pupils equal, round, reacting to light. Anicteric sclerae. Extraocular movements intact. Oropharynx clear without erythema, mass, or exudates.   NECK: Supple. No thyromegaly, JVD, or carotid bruits. No lymphadenopathy.   LUNGS: Clear to auscultation bilaterally. No wheeze or crackles. No use of accessory muscles for breathing.   CARDIOVASCULAR: S1, S2 regular rate and rhythm. No murmurs, rubs, or gallops.   ABDOMEN: Soft, nontender, nondistended. No hepatosplenomegaly. Normal bowel sounds.   EXTREMITIES: No pedal edema. No  clubbing or cyanosis. 2+ dorsalis pedis pulses palpable bilaterally.   SKIN: No acne, rash, or lesions.   LYMPH: No cervical lymphadenopathy.   NEUROLOGIC: Cranial nerves intact. No focal motor  or sensory deficits.   PSYCHOLOGICAL: The patient is awake, alert, oriented x3.   LABORATORY DATA: WBC 8.1, hemoglobin 12.8, hematocrit 39.5, platelet count 317, sodium 137, potassium 4.1, chloride 105, bicarb 26, BUN 15, creatinine 1.07, glucose 111, calcium 9.3, ALT 29, AST 21, alkaline phosphatase 102, total bilirubin 0.3, albumin 3.9. D-dimer less than 0.22. INR 0.9. PTT 27.5. CK 92. CK-MB 1.0. Troponin 0.04.   Chest x-ray showing clear lung fields, hyperinflated changes consistent with COPD, otherwise no acute abnormality.   EKG sinus tachycardia. No ST-T wave changes. Heart rate of 101.   ASSESSMENT AND PLAN: This is a 64 year old female with history of hypertension and hyperlipidemia admitted for chest pain.  1. Chest pain, could be stress related versus angina. No prior cardiac history but does have a history of hypertension. First set of troponin is negative. Will admit to telemetry under observation and get three sets of cardiac enzymes. Ideally would get a Myoview done but the patient does not want to stay overnight. Discussed with her. Will follow the cardiac enzymes and get echo done. If the enzymes are negative and echo does not show any wall motion abnormalities, will discharge her later today and get Myoview as an outpatient. I already called to schedule and she has an appointment for Myoview for 10/29/2011 at 9 a.m. Continue aspirin, losartan, and statin. Add metoprolol low dose.  2. Hypertension, initially elevated with pain, stress, anxiety and improved now. Continue Losartan and added low dose metoprolol. She is also tachycardic.  3. Hyperlipidemia. Continue statin. 4. GI and DVT prophylaxis. On Protonix and Lovenox.  CODE STATUS: FULL CODE.   TIME SPENT ON ADMISSION: 50 minutes.   ____________________________ Gladstone Lighter, MD rk:drc D: 10/27/2011 10:35:46 ET T: 10/27/2011 11:08:51 ET JOB#: [id number]  cc: Gladstone Lighter, MD, <Dictator> Bethena Roys. Ancil Boozer,  MD Gladstone Lighter MD ELECTRONICALLY SIGNED 10/29/2011 15:36

## 2014-09-11 ENCOUNTER — Telehealth: Payer: Self-pay | Admitting: Family Medicine

## 2014-09-11 MED ORDER — FLUOXETINE HCL 20 MG PO CAPS: 20.0000 mg | ORAL_CAPSULE | Freq: Every day | ORAL | Status: DC

## 2014-09-11 NOTE — Telephone Encounter (Signed)
Patient walked-in request to get a refill for prozac 20 mg qty and pt states she changed pharmacy to [facility]. Thanks

## 2014-09-11 NOTE — Telephone Encounter (Signed)
Connie Heath, Rx sent to requested pharmacy

## 2014-09-26 ENCOUNTER — Encounter: Payer: Self-pay | Admitting: Family Medicine

## 2014-09-26 DIAGNOSIS — M797 Fibromyalgia: Secondary | ICD-10-CM | POA: Insufficient documentation

## 2014-09-26 DIAGNOSIS — J45909 Unspecified asthma, uncomplicated: Secondary | ICD-10-CM | POA: Insufficient documentation

## 2014-10-08 ENCOUNTER — Encounter: Payer: Self-pay | Admitting: Family Medicine

## 2014-10-21 ENCOUNTER — Encounter: Payer: Self-pay | Admitting: Family Medicine

## 2014-10-21 ENCOUNTER — Ambulatory Visit (INDEPENDENT_AMBULATORY_CARE_PROVIDER_SITE_OTHER): Payer: 59

## 2014-10-21 ENCOUNTER — Ambulatory Visit (INDEPENDENT_AMBULATORY_CARE_PROVIDER_SITE_OTHER): Payer: 59 | Admitting: Family Medicine

## 2014-10-21 DIAGNOSIS — M25562 Pain in left knee: Secondary | ICD-10-CM | POA: Diagnosis not present

## 2014-10-21 DIAGNOSIS — I10 Essential (primary) hypertension: Secondary | ICD-10-CM | POA: Diagnosis not present

## 2014-10-21 MED ORDER — VALSARTAN 80 MG PO TABS: 80.0000 mg | ORAL_TABLET | Freq: Every day | ORAL | Status: DC

## 2014-10-21 NOTE — Progress Notes (Signed)
CC: Connie Heath is a 64 y.o. female is here for Hypertension   Subjective: HPI:  Follow-up hypercalcemia: States that she's had mild elevations in the past but has never had her parathyroid hormone checked. She denies any motor or sensory disturbances but has had some new knee pain. Denies any vague or history of abdominal pain. Denies any psychiatric complaints.  Follow-up essential hypertension: She tried taking amlodipine at 5 and 10 mg both of which were ineffective for blood pressure control. At 10 mg it caused intolerable edema of both lower lower extremities.denies any chest pain shortness of breath orthopnea nor peripheral edema  Left knee pain that began one month agobeen present on a daily basis. It's localized in the anterior aspect of the knee and seems to be just lateral to left and right of the kneecap. It's worse with any extension of the knee. Worse going downstairs. When going downstairs also feels unstable. She denies any catching locking or giving way. Denies any swelling redness or warmth. No benefit from rest or ibuprofen.    Review Of Systems Outlined In HPI  Past Medical History  Diagnosis Date  . Hypertension   . Depression   . Hyperlipidemia     H/o myalgias with Vytorin but tolerating simvastatin  . GERD (gastroesophageal reflux disease)   . Myalgia     Hx of   . Fatigue   . S/P cholecystectomy   . S/P hysterectomy   . Shingles     Past Surgical History  Procedure Laterality Date  . Tonsillectomy and adenoidectomy    . Partial hysterectomy    . Laparoscopic cholecystectomy     Family History  Problem Relation Age of Onset  . Lung cancer Mother   . Cancer Father     Stomach  . Heart failure Maternal Grandmother 80    CHF  . Heart failure Maternal Grandfather 50    Acute MI    History   Social History  . Marital Status: Married    Spouse Name: N/A  . Number of Children: N/A  . Years of Education: N/A   Occupational History  .  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 Smoking status: Never Smoker   . Smokeless tobacco: Never Used  . Alcohol Use: Yes     Comment: Occasional  . Drug Use: No  . Sexual Activity: Not on file   Other Topics Concern  . Not on file   Social History Narrative   Married   Gets regular exercise     Objective: BP 147/96 mmHg  Pulse 112  Wt 221 lb (100.245 kg)  General: Alert and Oriented, No Acute Distress HEENT: Pupils equal, round, reactive to light. Conjunctivae clear.  Moist mucous membranes Lungs: Clear to auscultation bilaterally, no wheezing/ronchi/rales.  Comfortable work of breathing. Good air movement. Cardiac: Regular rate and rhythm. Normal S1/S2.  No murmurs, rubs, nor gallops.   Left knee exam shows full-strength and range of motion. There is no swelling, redness, nor warmth overlying the knee.  No patellar crepitus. positivepatellar apprehension. No pain with palpation of the inferior patellar pole.  No pain or laxity with valgus nor varus stress. Anterior drawer is negative. McMurray's positive. No popliteal space tenderness or palpable mass. No medial or lateral joint line tenderness to palpation. Extremities: No peripheral edema.  Strong peripheral pulses.  Mental Status: No depression, anxiety, nor agitation. Skin: Warm and dry.  Assessment & Plan: Elmire was seen today for hypertension.  Diagnoses and all orders for this visit:  Hypercalcemia Orders: -     PTH, Intact and Calcium -     Calcium, ionized  Essential hypertension Orders: -     valsartan (DIOVAN) 80 MG tablet; Take 1 tablet (80 mg total) by mouth daily.  Left knee pain Orders: -     DG Knee Complete 4 Views Left; Future   Hypercalcemia: Rechecking calcium and rule out hyperparathyroidism Essential hypertension: Uncontrolled begin valsartan, may increase to 2 tablets daily if blood pressure remains well 140/90 after 5 days. If she is  able to get it consistently below 140/90 no need for follow-up until 3 months from now Left knee pain: Suspect meniscal tear versus patellofemoral syndrome, start with x-rays to help determine next intervention.  Return if symptoms worsen or fail to improve.

## 2014-10-22 ENCOUNTER — Encounter: Payer: Self-pay | Admitting: Family Medicine

## 2014-10-22 DIAGNOSIS — E213 Hyperparathyroidism, unspecified: Secondary | ICD-10-CM | POA: Insufficient documentation

## 2014-10-22 LAB — PTH, INTACT AND CALCIUM
CALCIUM: 9.6 mg/dL (ref 8.4–10.5)
PTH: 81 pg/mL — ABNORMAL HIGH (ref 14–64)

## 2014-10-22 LAB — CALCIUM, IONIZED: Calcium, Ion: 1.4 mmol/L — ABNORMAL HIGH (ref 1.12–1.32)

## 2014-10-25 ENCOUNTER — Telehealth: Payer: Self-pay | Admitting: Family Medicine

## 2014-10-25 DIAGNOSIS — E213 Hyperparathyroidism, unspecified: Secondary | ICD-10-CM

## 2014-10-25 NOTE — Telephone Encounter (Signed)
Connie Heath, Will you please let patient know I've placed a ENT referral to look into removing one or more of her parathyroid glands?

## 2014-10-25 NOTE — Telephone Encounter (Signed)
Pt aware.

## 2015-04-07 ENCOUNTER — Other Ambulatory Visit: Payer: Self-pay

## 2015-04-07 ENCOUNTER — Other Ambulatory Visit: Payer: Self-pay | Admitting: Family Medicine

## 2015-04-07 MED ORDER — SIMVASTATIN 40 MG PO TABS: 40.0000 mg | ORAL_TABLET | Freq: Every day | ORAL | Status: DC

## 2015-04-07 MED ORDER — FLUOXETINE HCL 20 MG PO CAPS: 20.0000 mg | ORAL_CAPSULE | Freq: Every day | ORAL | Status: DC

## 2015-08-04 MED FILL — FLUoxetine HCL 20 MG CAPS: 20 | 90 days supply | Qty: 90 | Fill #1

## 2015-08-04 MED FILL — SIMVASTATIN 40 MG TABLET: 40 | 90 days supply | Qty: 90 | Fill #1

## 2015-10-03 MED FILL — SF 5000 PLUS CREAM: 1.1 | 20 days supply | Qty: 51 | Fill #0

## 2015-11-26 ENCOUNTER — Other Ambulatory Visit: Payer: Self-pay | Admitting: Family Medicine

## 2015-11-26 MED FILL — SIMVASTATIN 40 MG TABLET: 40 | 30 days supply | Qty: 30 | Fill #0

## 2015-11-26 MED FILL — FLUoxetine HCL 20 MG CAPS: 20 | 30 days supply | Qty: 30 | Fill #0

## 2015-12-16 ENCOUNTER — Ambulatory Visit (INDEPENDENT_AMBULATORY_CARE_PROVIDER_SITE_OTHER): Payer: 59 | Admitting: Physician Assistant

## 2015-12-16 ENCOUNTER — Encounter: Payer: Self-pay | Admitting: Physician Assistant

## 2015-12-16 VITALS — BP 134/90 | HR 101 | Ht 67.0 in | Wt 220.0 lb

## 2015-12-16 DIAGNOSIS — E785 Hyperlipidemia, unspecified: Secondary | ICD-10-CM

## 2015-12-16 DIAGNOSIS — E559 Vitamin D deficiency, unspecified: Secondary | ICD-10-CM | POA: Diagnosis not present

## 2015-12-16 DIAGNOSIS — E213 Hyperparathyroidism, unspecified: Secondary | ICD-10-CM | POA: Diagnosis not present

## 2015-12-16 DIAGNOSIS — Z1159 Encounter for screening for other viral diseases: Secondary | ICD-10-CM | POA: Diagnosis not present

## 2015-12-16 DIAGNOSIS — Z1231 Encounter for screening mammogram for malignant neoplasm of breast: Secondary | ICD-10-CM

## 2015-12-16 DIAGNOSIS — Z0000 Encounter for general adult medical examination without abnormal findings: Secondary | ICD-10-CM | POA: Diagnosis not present

## 2015-12-16 DIAGNOSIS — Z131 Encounter for screening for diabetes mellitus: Secondary | ICD-10-CM | POA: Diagnosis not present

## 2015-12-16 DIAGNOSIS — Z8639 Personal history of other endocrine, nutritional and metabolic disease: Secondary | ICD-10-CM

## 2015-12-16 DIAGNOSIS — Z1211 Encounter for screening for malignant neoplasm of colon: Secondary | ICD-10-CM

## 2015-12-16 DIAGNOSIS — F329 Major depressive disorder, single episode, unspecified: Secondary | ICD-10-CM

## 2015-12-16 DIAGNOSIS — F32A Depression, unspecified: Secondary | ICD-10-CM

## 2015-12-16 LAB — CBC WITH DIFFERENTIAL/PLATELET
BASOS ABS: 83 {cells}/uL (ref 0–200)
BASOS PCT: 1 %
EOS ABS: 166 {cells}/uL (ref 15–500)
EOS PCT: 2 %
HCT: 40.2 % (ref 35.0–45.0)
Hemoglobin: 13.3 g/dL (ref 11.7–15.5)
LYMPHS PCT: 35 %
Lymphs Abs: 2905 cells/uL (ref 850–3900)
MCH: 30.4 pg (ref 27.0–33.0)
MCHC: 33.1 g/dL (ref 32.0–36.0)
MCV: 91.8 fL (ref 80.0–100.0)
MONOS PCT: 9 %
MPV: 10.8 fL (ref 7.5–12.5)
Monocytes Absolute: 747 cells/uL (ref 200–950)
Neutro Abs: 4399 cells/uL (ref 1500–7800)
Neutrophils Relative %: 53 %
PLATELETS: 359 10*3/uL (ref 140–400)
RBC: 4.38 MIL/uL (ref 3.80–5.10)
RDW: 14 % (ref 11.0–15.0)
WBC: 8.3 10*3/uL (ref 3.8–10.8)

## 2015-12-16 MED ORDER — FLUOXETINE HCL 20 MG PO CAPS: 20.0000 mg | ORAL_CAPSULE | Freq: Every day | ORAL | 3 refills | Status: AC

## 2015-12-16 NOTE — Progress Notes (Signed)
   Subjective:    Patient ID: Connie Heath, female    DOB: 1951/01/26, 65 y.o.   MRN: [id number]  HPI  Pt is a 65 yo female with hx of hyperparathyroidism and depression who presents to the clinic to re-establish care and get medication refills. Her PCP in clinic is leaving and she is establishing with myself.   She is overall doing well today.she does need some health maintance ordered.   Hx of HTN that resolved with parathyroid removal in 2016. She would like labs to make sure calcium still good. Checks BP at home and running 106-115/73-83. No CP, palpitations, headaches or dizziness.   Her mood is good and has been for a while on prozac. Denies any suicidial or homicidal thoughts.   Review of Systems  All other systems reviewed and are negative.      Objective:   Physical Exam  Constitutional: She is oriented to person, place, and time. She appears well-developed and well-nourished.  Obese.   HENT:  Head: Normocephalic and atraumatic.  Eyes: Conjunctivae are normal.  Neck: No thyromegaly present.  Cardiovascular: Normal rate, regular rhythm and normal heart sounds.   Pulmonary/Chest: Effort normal and breath sounds normal.  Lymphadenopathy:    She has no cervical adenopathy.  Neurological: She is alert and oriented to person, place, and time.  Psychiatric: She has a normal mood and affect. Her behavior is normal.          Assessment & Plan:  Preventative health- mammogram ordered. Colonoscopy ordered. Lipid,cmp, vitamin D, CBC ordered.   Baseline EKG- NSR at 74. No ST depression or elevation. No acute changes.   Obesity- discussed weight loss with diet and exercise changes.   Hx of HTN/elevated BP- normal BP's at home. Continue to monitor at home.   Hx of hyperparathyroidism- recheck calcium. Hx of surgery in June 2016.   Depression-PHQ-2 was 0.  refilled prozac for 1 year.

## 2015-12-17 ENCOUNTER — Other Ambulatory Visit: Payer: Self-pay | Admitting: *Deleted

## 2015-12-17 LAB — LIPID PANEL
CHOL/HDL RATIO: 4.6 ratio (ref <=5.0)
CHOLESTEROL: 180 mg/dL (ref 125–200)
HDL: 39 mg/dL — AB (ref >=46)
LDL Cholesterol: 111 mg/dL (ref <130)
TRIGLYCERIDES: 148 mg/dL (ref <150)
VLDL: 30 mg/dL (ref <30)

## 2015-12-17 LAB — COMPLETE METABOLIC PANEL WITH GFR
ALT: 20 U/L (ref 6–29)
AST: 22 U/L (ref 10–35)
Albumin: 4.4 g/dL (ref 3.6–5.1)
Alkaline Phosphatase: 64 U/L (ref 33–130)
BUN: 16 mg/dL (ref 7–25)
CALCIUM: 9.7 mg/dL (ref 8.6–10.4)
CHLORIDE: 103 mmol/L (ref 98–110)
CO2: 25 mmol/L (ref 20–31)
CREATININE: 1.01 mg/dL — AB (ref 0.50–0.99)
GFR, EST AFRICAN AMERICAN: 68 mL/min (ref >=60)
GFR, Est Non African American: 59 mL/min — ABNORMAL LOW (ref >=60)
Glucose, Bld: 93 mg/dL (ref 65–99)
POTASSIUM: 4.9 mmol/L (ref 3.5–5.3)
Sodium: 138 mmol/L (ref 135–146)
Total Bilirubin: 0.4 mg/dL (ref 0.2–1.2)
Total Protein: 7.6 g/dL (ref 6.1–8.1)

## 2015-12-17 LAB — HEPATITIS C ANTIBODY: HCV AB: NEGATIVE

## 2015-12-17 LAB — CALCIUM, IONIZED: CALCIUM ION: 5.3 mg/dL (ref 4.8–5.6)

## 2015-12-17 LAB — VITAMIN D 25 HYDROXY (VIT D DEFICIENCY, FRACTURES): VIT D 25 HYDROXY: 50 ng/mL (ref 30–100)

## 2015-12-17 MED ORDER — SIMVASTATIN 40 MG PO TABS: 40.0000 mg | ORAL_TABLET | Freq: Every day | ORAL | 4 refills | Status: DC

## 2015-12-17 MED FILL — SIMVASTATIN 40 MG TABLET: 40 | 90 days supply | Qty: 90 | Fill #0

## 2015-12-21 ENCOUNTER — Encounter: Payer: Self-pay | Admitting: Physician Assistant

## 2015-12-22 MED FILL — FLUoxetine HCL 20 MG CAPS: 20 | 90 days supply | Qty: 90 | Fill #0

## 2015-12-26 ENCOUNTER — Ambulatory Visit (INDEPENDENT_AMBULATORY_CARE_PROVIDER_SITE_OTHER): Payer: 59

## 2015-12-26 DIAGNOSIS — Z1231 Encounter for screening mammogram for malignant neoplasm of breast: Secondary | ICD-10-CM

## 2016-03-08 DIAGNOSIS — Z1211 Encounter for screening for malignant neoplasm of colon: Secondary | ICD-10-CM | POA: Diagnosis not present

## 2016-03-08 LAB — HM COLONOSCOPY

## 2016-03-16 ENCOUNTER — Encounter: Payer: Self-pay | Admitting: Physician Assistant

## 2016-04-01 DIAGNOSIS — H524 Presbyopia: Secondary | ICD-10-CM | POA: Diagnosis not present

## 2016-04-22 MED FILL — FLUoxetine HCL 20 MG CAPS: 20 | 90 days supply | Qty: 90 | Fill #1

## 2016-04-22 MED FILL — SIMVASTATIN 40 MG TABLET: 40 | 90 days supply | Qty: 90 | Fill #1

## 2016-08-09 MED FILL — FLUoxetine HCL 20 MG CAPS: 20 | 90 days supply | Qty: 90 | Fill #2

## 2016-08-09 MED FILL — SIMVASTATIN 40 MG TABLET: 40 | 90 days supply | Qty: 90 | Fill #2

## 2016-12-09 MED FILL — FLUoxetine HCL 20 MG CAPS: 20 | 90 days supply | Qty: 90 | Fill #3

## 2016-12-09 MED FILL — SIMVASTATIN 40 MG TABLET: 40 | 90 days supply | Qty: 90 | Fill #3

## 2017-02-16 ENCOUNTER — Ambulatory Visit: Payer: 59 | Admitting: Family Medicine

## 2019-02-19 ENCOUNTER — Other Ambulatory Visit: Payer: Self-pay

## 2019-02-19 ENCOUNTER — Emergency Department
Admission: EM | Admit: 2019-02-19 | Discharge: 2019-02-19 | Disposition: A | Discharge status: Home / Self Care | Payer: Medicare HMO | Source: Home / Self Care | Attending: Family Medicine | Admitting: Family Medicine

## 2019-02-19 DIAGNOSIS — N390 Urinary tract infection, site not specified: Secondary | ICD-10-CM

## 2019-02-19 DIAGNOSIS — R300 Dysuria: Secondary | ICD-10-CM

## 2019-02-19 LAB — POCT CBC W AUTO DIFF (K'VILLE URGENT CARE)

## 2019-02-19 MED ORDER — CIPROFLOXACIN HCL 500 MG PO TABS: 500.0000 mg | ORAL_TABLET | Freq: Two times a day (BID) | ORAL | 0 refills | Status: AC

## 2019-02-19 NOTE — ED Triage Notes (Signed)
Pt c/o UTI sxs since 9/23. 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 A culture was not done. Sxs returned Saturday evening. Took Azo 1am this morning.

## 2019-02-19 NOTE — ED Provider Notes (Signed)
[facility]    CSN: [id number] Arrival date & time: 02/19/19  1244      History   Chief Complaint Chief Complaint  Patient presents with  . Urinary Tract Infection    HPI SHAWNEE PENICK is a 68 y.o. female.   Patient reports that she developed UTI symptoms about 12 days ago and was prescribed Macrodantin 50mg  QID (a urine culture was not done).  She improved after 4 days.  Her symptoms recurred five days ago.  She has now developed lower abdominal discomfort, worse when emptying her bladder, nausea (without vomiting), and mild right flank pain.  She has been taking AZO with mild improvement.  She denies fever and vaginal discharge.  No LMP recorded. Patient has had a partial hysterectomy.   The history is provided by the patient.    Past Medical History:  Diagnosis Date  . Depression   . Fatigue   . GERD (gastroesophageal reflux disease)   . Hyperlipidemia    H/o myalgias with Vytorin but tolerating simvastatin  . Hypertension   . Myalgia    Hx of   . S/P cholecystectomy   . S/P hysterectomy   . Shingles     Patient Active Problem List   Diagnosis Date Noted  . Hyperparathyroidism ([place]) 10/22/2014  . Extrinsic asthma 09/26/2014  . Fibromyalgia 09/26/2014  . Poor concentration 08/08/2014  . Family history of colon cancer 08/08/2014  . Insomnia 08/08/2014  . Prediabetes 08/08/2014  . Essential hypertension 07/19/2014  . Hyperlipidemia 07/19/2014  . Hypercalcemia 07/19/2014  . Depression 07/19/2014  . ABNORMAL EKG 12/04/2008    Past Surgical History:  Procedure Laterality Date  . LAPAROSCOPIC CHOLECYSTECTOMY    . PARTIAL HYSTERECTOMY    . TONSILLECTOMY AND ADENOIDECTOMY         Home Medications    Prior to Admission medications   Medication Sig Start Date End Date Taking? Authorizing Provider  aspirin EC 81 MG tablet Take 81 mg by mouth daily.   Yes [provider]  omeprazole (PRILOSEC) 40 MG capsule Take 40 mg by mouth daily.    Yes [provider]  Biotin 10 MG TABS Take by mouth.    [provider]  Cholecalciferol (VITAMIN D-3 PO) Take 4,000 Int'l Units by mouth.    [provider]  ciprofloxacin (CIPRO) 500 MG tablet Take 1 tablet (500 mg total) by mouth 2 (two) times daily for 7 days. 02/19/19 02/26/19  Kandra Nicolas, MD  FLUoxetine (PROZAC) 20 MG capsule Take 1 capsule (20 mg total) by mouth daily. 12/16/15   Breeback, Jade L, PA-C  simvastatin (ZOCOR) 40 MG tablet Take 1 tablet (40 mg total) by mouth at bedtime. 12/17/15   Donella Stade, PA-C    Family History Family History  Problem Relation Age of Onset  . Lung cancer Mother   . Cancer Father        Stomach  . Heart failure Maternal Grandmother 80       CHF  . Heart failure Maternal Grandfather 50       Acute MI    Social History Social History   Tobacco Use  . Smoking status: Never Smoker  . Smokeless tobacco: Never Used  Substance Use Topics  . Alcohol use: Yes    Comment: Occasional  . Drug use: No     Allergies   Bee venom, Iodine, Amlodipine, Lisinopril, and Losartan   Review of Systems Review of Systems  Constitutional: Positive for appetite  change. Negative for chills, diaphoresis, fatigue and fever.  HENT: Negative.   Eyes: Negative.   Respiratory: Negative.   Cardiovascular: Negative.   Gastrointestinal: Positive for nausea. Negative for abdominal distention and vomiting.  Genitourinary: Positive for urgency. Negative for dysuria, flank pain, frequency, vaginal bleeding, vaginal discharge and vaginal pain.  Musculoskeletal: Negative.   Neurological: Negative for headaches.     Physical Exam Triage Vital Signs ED Triage Vitals  Enc Vitals Group     BP 02/19/19 1305 123/75     Pulse Rate 02/19/19 1305 (!) 124     Resp 02/19/19 1305 18     Temp 02/19/19 1305 (!) 97.4 F (36.3 C)     Temp Source 02/19/19 1305 Oral     SpO2 02/19/19 1305 97 %     Weight 02/19/19 1306 191 lb (86.6 kg)      Height 02/19/19 1306 5\' 7"  (1.702 m)     Head Circumference --      Peak Flow --      Pain Score 02/19/19 1306 3     Pain Loc --      Pain Edu? --      Excl. in [place]? --    No data found.  Updated Vital Signs BP 123/75 (BP Location: Right Arm)   Pulse (!) 124   Temp (!) 97.4 F (36.3 C) (Oral)   Resp 18   Ht 5\' 7"  (1.702 m)   Wt 86.6 kg   SpO2 97%   BMI 29.91 kg/m   Visual Acuity Right Eye Distance:   Left Eye Distance:   Bilateral Distance:    Right Eye Near:   Left Eye Near:    Bilateral Near:     Physical Exam Vitals signs and nursing note reviewed.  Constitutional:      General: She is not in acute distress. HENT:     Head: Normocephalic.     Right Ear: External ear normal.     Left Ear: External ear normal.     Nose: Nose normal.     Mouth/Throat:     Pharynx: Oropharynx is clear.  Eyes:     Conjunctiva/sclera: Conjunctivae normal.     Pupils: Pupils are equal, round, and reactive to light.  Cardiovascular:     Rate and Rhythm: Tachycardia present.     Heart sounds: Normal heart sounds.  Pulmonary:     Breath sounds: Normal breath sounds.  Abdominal:     General: Abdomen is flat. Bowel sounds are normal.     Palpations: Abdomen is soft.       Comments: Vague mild right lower quadrant tenderness to palpation as noted on diagram.   Musculoskeletal:     Right lower leg: No edema.     Left lower leg: No edema.  Lymphadenopathy:     Cervical: No cervical adenopathy.  Skin:    General: Skin is warm and dry.     Findings: No rash.  Neurological:     Mental Status: She is alert.      UC Treatments / Results  Labs (all labs ordered are listed, but only abnormal results are displayed) Labs Reviewed  URINE CULTURE  POCT CBC W AUTO DIFF ([place]):  WBC 9.1; LY 19.8; MO 2.7; GR 77.5; Hgb 12.9; Platelets 425     EKG   Radiology No results found.  Procedures Procedures (including critical care time)  Medications Ordered in UC  Medications - No data to display  Initial Impression /  Assessment and Plan / UC Course  I have reviewed the triage vital signs and the nursing notes.  Pertinent labs & imaging results that were available during my care of the patient were reviewed by me and considered in my medical decision making (see chart for details).    Unable to perform urinalysis because of recent use of AZO (Normal WBC reassuring). Urine culture pending.  Begin Cipro. Followup with Family Doctor if not improved in one week.    Final Clinical Impressions(s) / UC Diagnoses   Final diagnoses:  Dysuria  Lower urinary tract infectious disease     Discharge Instructions     Increased fluid intake.  May use non-prescription AZO for about two days, if desired, to decrease urinary discomfort.   If symptoms become significantly worse during the night or over the weekend, proceed to the local emergency room.     ED Prescriptions    Medication Sig Dispense Auth. Provider   ciprofloxacin (CIPRO) 500 MG tablet Take 1 tablet (500 mg total) by mouth 2 (two) times daily for 7 days. 14 tablet Kandra Nicolas, MD        Kandra Nicolas, MD 02/23/19 [id number]

## 2019-02-19 NOTE — Discharge Instructions (Addendum)
Increased fluid intake.  May use non-prescription AZO for about two days, if desired, to decrease urinary discomfort.   If symptoms become significantly worse during the night or over the weekend, proceed to the local emergency room.

## 2019-02-21 ENCOUNTER — Telehealth: Payer: Self-pay | Admitting: Emergency Medicine

## 2019-02-21 LAB — URINE CULTURE
MICRO NUMBER:: 955402
Result:: NO GROWTH
SPECIMEN QUALITY:: ADEQUATE

## 2019-04-16 ENCOUNTER — Emergency Department (HOSPITAL_COMMUNITY): Payer: Medicare HMO

## 2019-04-16 ENCOUNTER — Emergency Department (HOSPITAL_COMMUNITY)
Admission: EM | Admit: 2019-04-16 | Discharge: 2019-04-16 | Disposition: A | Discharge status: Home / Self Care | Payer: Medicare HMO | Attending: Emergency Medicine | Admitting: Emergency Medicine

## 2019-04-16 ENCOUNTER — Other Ambulatory Visit: Payer: Self-pay

## 2019-04-16 DIAGNOSIS — I10 Essential (primary) hypertension: Secondary | ICD-10-CM | POA: Insufficient documentation

## 2019-04-16 DIAGNOSIS — Z7982 Long term (current) use of aspirin: Secondary | ICD-10-CM | POA: Insufficient documentation

## 2019-04-16 DIAGNOSIS — R935 Abnormal findings on diagnostic imaging of other abdominal regions, including retroperitoneum: Secondary | ICD-10-CM | POA: Diagnosis not present

## 2019-04-16 DIAGNOSIS — Z79899 Other long term (current) drug therapy: Secondary | ICD-10-CM | POA: Insufficient documentation

## 2019-04-16 DIAGNOSIS — R112 Nausea with vomiting, unspecified: Secondary | ICD-10-CM | POA: Insufficient documentation

## 2019-04-16 DIAGNOSIS — R1031 Right lower quadrant pain: Secondary | ICD-10-CM

## 2019-04-16 DIAGNOSIS — R197 Diarrhea, unspecified: Secondary | ICD-10-CM

## 2019-04-16 LAB — URINALYSIS, ROUTINE W REFLEX MICROSCOPIC
Bilirubin Urine: NEGATIVE
Glucose, UA: NEGATIVE mg/dL
Hgb urine dipstick: NEGATIVE
Ketones, ur: 20 mg/dL — AB
Leukocytes,Ua: NEGATIVE
Nitrite: NEGATIVE
Protein, ur: 30 mg/dL — AB
Specific Gravity, Urine: 1.024 (ref 1.005–1.030)
pH: 5 (ref 5.0–8.0)

## 2019-04-16 LAB — COMPREHENSIVE METABOLIC PANEL
ALT: 41 U/L (ref 0–44)
AST: 41 U/L (ref 15–41)
Albumin: 2.2 g/dL — ABNORMAL LOW (ref 3.5–5.0)
Alkaline Phosphatase: 184 U/L — ABNORMAL HIGH (ref 38–126)
Anion gap: 14 (ref 5–15)
BUN: 7 mg/dL — ABNORMAL LOW (ref 8–23)
CO2: 24 mmol/L (ref 22–32)
Calcium: 9.2 mg/dL (ref 8.9–10.3)
Chloride: 93 mmol/L — ABNORMAL LOW (ref 98–111)
Creatinine, Ser: 0.97 mg/dL (ref 0.44–1.00)
GFR calc Af Amer: >60 mL/min (ref >60)
GFR calc non Af Amer: >60 mL/min (ref >60)
Glucose, Bld: 111 mg/dL — ABNORMAL HIGH (ref 70–99)
Potassium: 3.9 mmol/L (ref 3.5–5.1)
Sodium: 131 mmol/L — ABNORMAL LOW (ref 135–145)
Total Bilirubin: 0.7 mg/dL (ref 0.3–1.2)
Total Protein: 7 g/dL (ref 6.5–8.1)

## 2019-04-16 LAB — CBC
HCT: 32.9 % — ABNORMAL LOW (ref 36.0–46.0)
Hemoglobin: 11.2 g/dL — ABNORMAL LOW (ref 12.0–15.0)
MCH: 29.9 pg (ref 26.0–34.0)
MCHC: 34 g/dL (ref 30.0–36.0)
MCV: 87.7 fL (ref 80.0–100.0)
Platelets: 759 10*3/uL — ABNORMAL HIGH (ref 150–400)
RBC: 3.75 MIL/uL — ABNORMAL LOW (ref 3.87–5.11)
RDW: 13.9 % (ref 11.5–15.5)
WBC: 12.7 10*3/uL — ABNORMAL HIGH (ref 4.0–10.5)
nRBC: 0 % (ref 0.0–0.2)

## 2019-04-16 LAB — LIPASE, BLOOD: Lipase: 14 U/L (ref 11–51)

## 2019-04-16 MED ORDER — FENTANYL CITRATE (PF) 100 MCG/2ML IJ SOLN
25.0000 ug | Freq: Once | INTRAMUSCULAR | Status: AC
  Administered 2019-04-16: 25 ug via INTRAVENOUS
  Filled 2019-04-16: qty 2

## 2019-04-16 MED ORDER — DIPHENHYDRAMINE HCL 25 MG PO CAPS: 50.0000 mg | ORAL_CAPSULE | Freq: Once | ORAL | Status: AC

## 2019-04-16 MED ORDER — HYDROCORTISONE NA SUCCINATE PF 250 MG IJ SOLR
200.0000 mg | Freq: Once | INTRAMUSCULAR | Status: AC
  Administered 2019-04-16: 200 mg via INTRAVENOUS
  Filled 2019-04-16: qty 200

## 2019-04-16 MED ORDER — ONDANSETRON HCL 4 MG/2ML IJ SOLN
4.0000 mg | Freq: Once | INTRAMUSCULAR | Status: AC
  Administered 2019-04-16: 4 mg via INTRAVENOUS
  Filled 2019-04-16: qty 2

## 2019-04-16 MED ORDER — ONDANSETRON 4 MG PO TBDP: 4.0000 mg | ORAL_TABLET | Freq: Three times a day (TID) | ORAL | 0 refills | Status: DC | PRN

## 2019-04-16 MED ORDER — SODIUM CHLORIDE 0.9 % IV BOLUS
1000.0000 mL | Freq: Once | INTRAVENOUS | Status: AC
  Administered 2019-04-16: 1000 mL via INTRAVENOUS

## 2019-04-16 MED ORDER — DIPHENHYDRAMINE HCL 50 MG/ML IJ SOLN
50.0000 mg | Freq: Once | INTRAMUSCULAR | Status: AC
  Administered 2019-04-16: 50 mg via INTRAVENOUS
  Filled 2019-04-16: qty 1

## 2019-04-16 MED ORDER — OXYCODONE-ACETAMINOPHEN 5-325 MG PO TABS: 1.0000 | ORAL_TABLET | ORAL | 0 refills | Status: AC | PRN

## 2019-04-16 MED ORDER — IOHEXOL 300 MG/ML  SOLN
100.0000 mL | Freq: Once | INTRAMUSCULAR | Status: AC | PRN
  Administered 2019-04-16: 100 mL via INTRAVENOUS

## 2019-04-16 MED ORDER — SODIUM CHLORIDE 0.9% FLUSH: 3.0000 mL | Freq: Once | INTRAVENOUS | Status: DC

## 2019-04-16 NOTE — ED Provider Notes (Signed)
Physical Exam  BP 126/63 (BP Location: Right Arm)   Pulse 86   Temp 98.8 F (37.1 C) (Oral)   Resp 18   Ht 5\' 8"  (1.727 m)   Wt 81.6 kg   SpO2 99%   BMI 27.37 kg/m   Physical Exam  ED Course/Procedures   Clinical Course as of Apr 15 1820  Mon Apr 16, 2019  1544 WBC(!): 12.7 [JS]    Clinical Course User Index [JS] Janeece Fitting, PA-C    Procedures  MDM  Patient care assumed from [facility] at shift change, please see her note for a full HPI.Briefly, patient with a prior medical history of GERD, with right lower quadrant abdominal pain for the past week.  Had some anorexia, worse for the past 2 days.  Was given Augmentin for a possible pneumonia by her PCP 3 days ago.  Ports no improvement with Tylenol, ibuprofen, Augmentin.  Does not have any gynecological complaints, suspicion of food ingestions.  Plan is for patient to have CT abdomen and pelvis and dispo accordingly.  Of note, patient has been premedicated with steroids, Benadryl.  3:44 PM patient has been given an extra dose of Zofran, she is ready for CT.  CT Abdomen/Pelvis: 1. Nonvisualization of the appendix. However, there is a large  volume of ascites within the abdomen and pelvis which appears  partially loculated. In the acute setting findings may be the  sequelae of peritonitis. However, there are multiple partially  calcified peritoneal nodules throughout the omentum which are  concerning for peritoneal carcinomatosis. Noncalcified nodule in the  left lower quadrant peritoneal reflection is noted. Further  investigation with diagnostic paracentesis is advised.  2. Ill-defined low-density mass within the right iliac fossa is  suspected and may represent an ovarian neoplasm.  3. Aortic atherosclerosis.  4. Small bilateral pleural effusions.  5. Hiatal hernia.  6. Prior granulomatous disease.     5:40 PM Patient was reassesed by me, reports she has been running fevers throughout the week, a T-max of  1-1.3.  She also endorses nausea, anorexia, feeling of fullness.  Patient is also dealt with some constipation/diarrhea.  Reports the symptoms exacerbated the past 2 days, states there is an awful pain to the right lower quadrant which is worse after voiding.  She is a social drinker, states her last drink was in October, no prior IV drug use.  She denies any blood in her stool. Does have a surgical history remarkable for a partial hysterectomy, this was over 20 years ago due to severe endometriosis.  Prior cholecystectomy.  Will place call for GYN ONC in order to obtain further recommendations.  6:00PM Spoke to Dr. Everitt Amber from gynecology oncology who will evaluate patient in her office tomorrow afternoon and have paracentesis scheduled.  Patient is to be provided with Dr. Serita Grit office information.  I will personally forward patient's chart to Dr. Denman George.  Patient has been notified of results of her CT scan, a copy of the CT was also provided for patient.  Patient has also been provided pain medication, after reviewing PDMP, I feel that is appropriate for patient to have a prescription for Percocet, she has taken this in the past and tolerates it well.  With stable vital signs, patient stable for discharge.  Portions of this note were generated with [employer]. Dictation errors may occur despite best attempts at proofreading.         Janeece Fitting, PA-C 04/16/19 [id number]  Deno Etienne, DO 04/16/19 2311

## 2019-04-16 NOTE — ED Notes (Signed)
Pt aware of need for urine sample, will ring for assistance

## 2019-04-16 NOTE — Discharge Instructions (Addendum)
You were provided with the results of your CT abdomen and pelvis.  Dr. Terrence Dupont Rossi's office will call you tomorrow morning, if they do not call you by 10:30 AM, please call the number provided on your discharge papers.  I have provided a prescription for pain medication, please take this for severe pain.  A prescription for nausea medication has also been sent to your pharmacy.

## 2019-04-16 NOTE — ED Triage Notes (Signed)
Pt with right lower quadrant for the last 2 days. Has had 1 week of cough. No relief with prescribed medications. Pt reports diarrhea. Pt repots 100.3 fever at home. Decreased appetite.

## 2019-04-16 NOTE — ED Provider Notes (Signed)
[facility] Provider Note   CSN: [id number] Arrival date & time: 04/16/19  0920     History   Chief Complaint Chief Complaint  Patient presents with  . Abdominal Pain    HPI Connie Heath is a 68 y.o. female with a past medical history of GERD presenting to the ED with a chief complaint of abdominal pain.  1 week ago started having diarrhea, lower abdominal pain, subjective fever, cough and decreased appetite.  Right lower quadrant abdominal pain got worse 2 days ago.  She was prescribed Augmentin after a virtual visit with her PCP 3 days ago for possible pneumonia.  She has been taking this Augmentin as prescribed.  States that her cough has improved but her abdominal pain is worsened.  No improvement noted with Tylenol or ibuprofen.  No sick contacts with similar symptoms. Prior abdominal surgeries include partial hysterectomy and cholecystectomy. Denies dysuria (but does state the pain in RLQ gets worse after voiding), vaginal complaints, suspicious food ingestions, chest pain, shortness of breath.     HPI  Past Medical History:  Diagnosis Date  . Depression   . Fatigue   . GERD (gastroesophageal reflux disease)   . Hyperlipidemia    H/o myalgias with Vytorin but tolerating simvastatin  . Hypertension   . Myalgia    Hx of   . S/P cholecystectomy   . S/P hysterectomy   . Shingles     Patient Active Problem List   Diagnosis Date Noted  . Hyperparathyroidism ([place]) 10/22/2014  . Extrinsic asthma 09/26/2014  . Fibromyalgia 09/26/2014  . Poor concentration 08/08/2014  . Family history of colon cancer 08/08/2014  . Insomnia 08/08/2014  . Prediabetes 08/08/2014  . Essential hypertension 07/19/2014  . Hyperlipidemia 07/19/2014  . Hypercalcemia 07/19/2014  . Depression 07/19/2014  . ABNORMAL EKG 12/04/2008    Past Surgical History:  Procedure Laterality Date  . LAPAROSCOPIC CHOLECYSTECTOMY    . PARTIAL HYSTERECTOMY    .  TONSILLECTOMY AND ADENOIDECTOMY       OB History   No obstetric history on file.      Home Medications    Prior to Admission medications   Medication Sig Start Date End Date Taking? Authorizing Provider  aspirin EC 81 MG tablet Take 81 mg by mouth daily.    [provider]  Biotin 10 MG TABS Take by mouth.    [provider]  Cholecalciferol (VITAMIN D-3 PO) Take 4,000 Int'l Units by mouth.    [provider]  FLUoxetine (PROZAC) 20 MG capsule Take 1 capsule (20 mg total) by mouth daily. 12/16/15   Breeback, Jade L, PA-C  omeprazole (PRILOSEC) 40 MG capsule Take 40 mg by mouth daily.    [provider]  simvastatin (ZOCOR) 40 MG tablet Take 1 tablet (40 mg total) by mouth at bedtime. 12/17/15   Donella Stade, PA-C    Family History Family History  Problem Relation Age of Onset  . Lung cancer Mother   . Cancer Father        Stomach  . Heart failure Maternal Grandmother 80       CHF  . Heart failure Maternal Grandfather 50       Acute MI    Social History Social History   Tobacco Use  . Smoking status: Never Smoker  . Smokeless tobacco: Never Used  Substance Use Topics  . Alcohol use: Yes    Comment: Occasional  . Drug use: No  Allergies   Bee venom, Iodine, Amlodipine, Demerol [meperidine hcl], Lisinopril, Losartan, and Other   Review of Systems Review of Systems  Constitutional: Positive for chills and fever. Negative for appetite change.  HENT: Negative for ear pain, rhinorrhea, sneezing and sore throat.   Eyes: Negative for photophobia and visual disturbance.  Respiratory: Positive for cough. Negative for chest tightness, shortness of breath and wheezing.   Cardiovascular: Negative for chest pain and palpitations.  Gastrointestinal: Positive for abdominal pain, diarrhea, nausea and vomiting. Negative for blood in stool and constipation.  Genitourinary: Negative for dysuria, hematuria and urgency.  Musculoskeletal:  Negative for myalgias.  Skin: Negative for rash.  Neurological: Negative for dizziness, weakness and light-headedness.     Physical Exam Updated Vital Signs BP 130/75   Pulse (!) 101   Temp 98.8 F (37.1 C) (Oral)   Resp 18   Ht 5\' 8"  (1.727 m)   Wt 81.6 kg   SpO2 95%   BMI 27.37 kg/m   Physical Exam Vitals signs and nursing note reviewed.  Constitutional:      General: She is not in acute distress.    Appearance: She is well-developed.  HENT:     Head: Normocephalic and atraumatic.     Nose: Nose normal.  Eyes:     General: No scleral icterus.       Left eye: No discharge.     Conjunctiva/sclera: Conjunctivae normal.  Neck:     Musculoskeletal: Normal range of motion and neck supple.  Cardiovascular:     Rate and Rhythm: Regular rhythm. Tachycardia present.     Heart sounds: Normal heart sounds. No murmur. No friction rub. No gallop.   Pulmonary:     Effort: Pulmonary effort is normal. No respiratory distress.     Breath sounds: Normal breath sounds.  Abdominal:     General: Bowel sounds are normal. There is no distension.     Palpations: Abdomen is soft.     Tenderness: There is abdominal tenderness in the right lower quadrant. There is no guarding.  Musculoskeletal: Normal range of motion.  Skin:    General: Skin is warm and dry.     Findings: No rash.  Neurological:     Mental Status: She is alert.     Motor: No abnormal muscle tone.     Coordination: Coordination normal.      ED Treatments / Results  Labs (all labs ordered are listed, but only abnormal results are displayed) Labs Reviewed  COMPREHENSIVE METABOLIC PANEL - Abnormal; Notable for the following components:      Result Value   Sodium 131 (*)    Chloride 93 (*)    Glucose, Bld 111 (*)    BUN 7 (*)    Albumin 2.2 (*)    Alkaline Phosphatase 184 (*)    All other components within normal limits  CBC - Abnormal; Notable for the following components:   WBC 12.7 (*)    RBC 3.75 (*)     Hemoglobin 11.2 (*)    HCT 32.9 (*)    Platelets 759 (*)    All other components within normal limits  URINALYSIS, ROUTINE W REFLEX MICROSCOPIC - Abnormal; Notable for the following components:   Color, Urine AMBER (*)    APPearance HAZY (*)    Ketones, ur 20 (*)    Protein, ur 30 (*)    Bacteria, UA RARE (*)    All other components within normal limits  URINE CULTURE  LIPASE, BLOOD  EKG None  Radiology Dg Chest Portable 1 View  Result Date: 04/16/2019 CLINICAL DATA:  Cough. EXAM: PORTABLE CHEST 1 VIEW COMPARISON:  June 25, 2013. FINDINGS: The heart size and mediastinal contours are within normal limits. Both lungs are clear. The visualized skeletal structures are unremarkable. IMPRESSION: No active disease. Electronically Signed   By: Marijo Conception M.D.   On: 04/16/2019 12:56    Procedures Procedures (including critical care time)  Medications Ordered in ED Medications  sodium chloride flush (NS) 0.9 % injection 3 mL (3 mLs Intravenous Not Given 04/16/19 1029)  diphenhydrAMINE (BENADRYL) capsule 50 mg (has no administration in time range)    Or  diphenhydrAMINE (BENADRYL) injection 50 mg (has no administration in time range)  sodium chloride 0.9 % bolus 1,000 mL (0 mLs Intravenous Stopped 04/16/19 1241)  fentaNYL (SUBLIMAZE) injection 25 mcg (25 mcg Intravenous Given 04/16/19 1111)  ondansetron (ZOFRAN) injection 4 mg (4 mg Intravenous Given 04/16/19 1113)  hydrocortisone sodium succinate (SOLU-CORTEF) injection 200 mg (200 mg Intravenous Given 04/16/19 1241)  fentaNYL (SUBLIMAZE) injection 25 mcg (25 mcg Intravenous Given 04/16/19 1301)     Initial Impression / Assessment and Plan / ED Course  I have reviewed the triage vital signs and the nursing notes.  Pertinent labs & imaging results that were available during my care of the patient were reviewed by me and considered in my medical decision making (see chart for details).        69 year old female with  past medical history of GERD presenting to the ED with a chief complaint abdominal pain.  Reports associated diarrhea, subjective fever, cough and decreased appetite.  Abdominal pain is generalized and described as distended but worse in the right lower quadrant.  Denies any urinary symptoms.  She was given Augmentin for suppose it pneumonia 3 days ago and states that her cough has improved.  On exam there are tenderness palpation of the right lower quadrant without rebound or guarding.  Patient initially tachycardic, does appear dehydrated.  Lab work significant for leukocytosis of 12.7, some bacteriuria, sodium of 131, chest x-ray is unremarkable.  Patient states that she was "flushed" when given IV contrast in the past and this is listed one of her allergies.  She will need premedication prior to CT scan of the abdomen pelvis. Care handed off to oncoming provider pending final disposition. Anticipate discharge home if symptoms improve and imaging is unremarkable.  Final Clinical Impressions(s) / ED Diagnoses   Final diagnoses:  Nausea vomiting and diarrhea    ED Discharge Orders    None     Portions of this note were generated with Dragon dictation software. Dictation errors may occur despite best attempts at proofreading.    Delia Heady, PA-C 04/16/19 1446    Maudie Flakes, MD 04/28/19 [phone]

## 2019-04-17 ENCOUNTER — Telehealth: Payer: Self-pay | Admitting: *Deleted

## 2019-04-17 ENCOUNTER — Encounter: Payer: Self-pay | Admitting: Gynecologic Oncology

## 2019-04-17 ENCOUNTER — Inpatient Hospital Stay: Payer: Medicare HMO | Attending: Gynecologic Oncology | Admitting: Gynecologic Oncology

## 2019-04-17 ENCOUNTER — Other Ambulatory Visit: Payer: Self-pay | Admitting: Gynecologic Oncology

## 2019-04-17 ENCOUNTER — Encounter: Payer: Self-pay | Admitting: Oncology

## 2019-04-17 ENCOUNTER — Inpatient Hospital Stay: Payer: Medicare HMO

## 2019-04-17 ENCOUNTER — Telehealth: Payer: Self-pay

## 2019-04-17 ENCOUNTER — Ambulatory Visit: Payer: Medicare HMO

## 2019-04-17 ENCOUNTER — Other Ambulatory Visit: Payer: Self-pay

## 2019-04-17 VITALS — BP 132/74 | HR 120 | Temp 98.3°F | Resp 20 | Ht 67.0 in | Wt 183.0 lb

## 2019-04-17 DIAGNOSIS — G893 Neoplasm related pain (acute) (chronic): Secondary | ICD-10-CM | POA: Diagnosis not present

## 2019-04-17 DIAGNOSIS — R180 Malignant ascites: Secondary | ICD-10-CM

## 2019-04-17 DIAGNOSIS — Z801 Family history of malignant neoplasm of trachea, bronchus and lung: Secondary | ICD-10-CM | POA: Insufficient documentation

## 2019-04-17 DIAGNOSIS — C786 Secondary malignant neoplasm of retroperitoneum and peritoneum: Secondary | ICD-10-CM

## 2019-04-17 DIAGNOSIS — C801 Malignant (primary) neoplasm, unspecified: Secondary | ICD-10-CM | POA: Diagnosis not present

## 2019-04-17 DIAGNOSIS — R7989 Other specified abnormal findings of blood chemistry: Secondary | ICD-10-CM | POA: Diagnosis not present

## 2019-04-17 DIAGNOSIS — Z79899 Other long term (current) drug therapy: Secondary | ICD-10-CM | POA: Insufficient documentation

## 2019-04-17 DIAGNOSIS — K449 Diaphragmatic hernia without obstruction or gangrene: Secondary | ICD-10-CM | POA: Insufficient documentation

## 2019-04-17 DIAGNOSIS — Z885 Allergy status to narcotic agent status: Secondary | ICD-10-CM | POA: Diagnosis not present

## 2019-04-17 DIAGNOSIS — R630 Anorexia: Secondary | ICD-10-CM | POA: Diagnosis not present

## 2019-04-17 DIAGNOSIS — D72829 Elevated white blood cell count, unspecified: Secondary | ICD-10-CM | POA: Insufficient documentation

## 2019-04-17 DIAGNOSIS — I700 Atherosclerosis of aorta: Secondary | ICD-10-CM | POA: Insufficient documentation

## 2019-04-17 DIAGNOSIS — Z90710 Acquired absence of both cervix and uterus: Secondary | ICD-10-CM | POA: Insufficient documentation

## 2019-04-17 DIAGNOSIS — R194 Change in bowel habit: Secondary | ICD-10-CM | POA: Diagnosis not present

## 2019-04-17 DIAGNOSIS — R140 Abdominal distension (gaseous): Secondary | ICD-10-CM | POA: Diagnosis not present

## 2019-04-17 DIAGNOSIS — R05 Cough: Secondary | ICD-10-CM | POA: Diagnosis not present

## 2019-04-17 DIAGNOSIS — Z5111 Encounter for antineoplastic chemotherapy: Secondary | ICD-10-CM | POA: Insufficient documentation

## 2019-04-17 DIAGNOSIS — Z888 Allergy status to other drugs, medicaments and biological substances status: Secondary | ICD-10-CM | POA: Insufficient documentation

## 2019-04-17 DIAGNOSIS — R1900 Intra-abdominal and pelvic swelling, mass and lump, unspecified site: Secondary | ICD-10-CM

## 2019-04-17 DIAGNOSIS — J90 Pleural effusion, not elsewhere classified: Secondary | ICD-10-CM | POA: Insufficient documentation

## 2019-04-17 DIAGNOSIS — Z800 Family history of malignant neoplasm of digestive organs: Secondary | ICD-10-CM | POA: Diagnosis not present

## 2019-04-17 DIAGNOSIS — Z8249 Family history of ischemic heart disease and other diseases of the circulatory system: Secondary | ICD-10-CM | POA: Insufficient documentation

## 2019-04-17 LAB — URINE CULTURE: Culture: NO GROWTH

## 2019-04-17 LAB — CEA (IN HOUSE-CHCC): CEA (CHCC-In House): <1 ng/mL (ref 0.00–5.00)

## 2019-04-17 MED FILL — OXYCODONE-ACETAMINOPHEN 5-3: 5-325 | 2 days supply | Qty: 6 | Fill #0

## 2019-04-17 NOTE — Telephone Encounter (Signed)
[facility] said that the labs noted in the appointment notes for fluids for cytology, culture, and cell count is fine. Doylene Canning will make a note for specimens to be sent on the schedule in [place].

## 2019-04-17 NOTE — Progress Notes (Signed)
Met with Curt Bears after her [facility] appointment.  Gave her the [employer] and discussed her upcoming appointments.  Encouraged her to call with any questions.

## 2019-04-17 NOTE — Telephone Encounter (Signed)
Called and scheduled the patient for this afternoon

## 2019-04-17 NOTE — Progress Notes (Signed)
New Patient Note: Gyn-Onc  CC:  Chief Complaint  Patient presents with  . Malignant ascites    Assessment/Plan:  Connie. Connie Heath  is a 68 y.o.  year old with apparent stage 3 or 4 metastatic cancer of unknown primary (possible ovarian). Alternatively an infectious process (such as appendiceal phlegmon/abscess) might be present.  I am recommending US guided paracentesis for therapeutic and diagnostic purposes.  We will draw CEA and CA 125 tumor markers today.  She appears very comfortable, non-toxic, and therefore I have a lower suspicion for ruptured appendix.  If ovarian or gynecologic primary is identified on paracentesis, I am recommending 3 cycles of neoadjuvant chemotherapy with carboplatin and paclitaxel followed by reassessment scans and interval debulking surgery if she has had a good response to therapy. This would then be followed by an additional 3 cycles of chemotherapy.  HPI:  Connie Heath is a 68 year old P0 who was seen as a referral from the [facility] ED for ascites and a right pelvic mass with peritoneal nodularity.   Saw a gynecologist in [place] for dyspareunia in September, 2020. She reported having a normal examination.  In mid November, 2020 she began feeling unwell with "fevers" (none >100.3) and malaise and intractable cough. She was tested for COVID on November 21st, 2020.  Her last fever was on 100.3 and was approximately the 21st November. She took anti-tussives, augmentin and this improved her respiratory symptoms. However on 04/12/19 she began experiencing abdominal fullness and abdominal pains in the right lower quadrant.   She reported having leakage of stools but no urinary abnormalities.   Her past medical history is significant for hypercholesterolism, history of endometriosis, depression. Her past surgical history is significant for tonsillectomy, numerous laparoscopies for endometriosis as a younger woman, hysterectomy ("partial") in Aug 30, 1986,  laparoscopic cholecystectomy in 08/30/1998, in August 29, 2009 she had a parathyroidectomy, right hip replacement 08/29/2016.  She has never been pregnant.   Her family history is significant for a mother with lung cancer in 08/30/1982 and her father died from gastric cancer, both were smokers.   She lives with her husband who is of good health. 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   Current Meds:  Outpatient Encounter Medications as of 04/17/2019  Medication Sig  . amoxicillin-clavulanate (AUGMENTIN) 875-125 MG tablet Take by mouth.  . Biotin 10 MG TABS Take by mouth.  . Cholecalciferol (VITAMIN D-3 PO) Take 4,000 Int'l Units by mouth.  Marland Kitchen FLUoxetine (PROZAC) 20 MG capsule Take 1 capsule (20 mg total) by mouth daily.  . ondansetron (ZOFRAN ODT) 4 MG disintegrating tablet Take 1 tablet (4 mg total) by mouth every 8 (eight) hours as needed for nausea or vomiting.  Marland Kitchen oxyCODONE-acetaminophen (PERCOCET/ROXICET) 5-325 MG tablet Take 1 tablet by mouth every 4 (four) hours as needed for up to 3 days for severe pain.  . simvastatin (ZOCOR) 40 MG tablet Take 1 tablet (40 mg total) by mouth at bedtime.  Marland Kitchen VIRTUSSIN A/C 100-10 MG/5ML syrup SMARTSIG:5-10 Milliliter(s) By Mouth Every Night PRN  . vitamin E 1000 UNIT capsule Take by mouth.  Marland Kitchen aspirin EC 81 MG tablet Take 81 mg by mouth daily.  . [DISCONTINUED] omeprazole (PRILOSEC) 40 MG capsule Take 40 mg by mouth daily.   No facility-administered encounter medications on file as of 04/17/2019.     Allergy:  Allergies  Allergen Reactions  .  Bee Venom Anaphylaxis  . Iodinated Diagnostic Agents Anaphylaxis    Other reaction(s): Other (See Comments) Other Reaction: CNS Disorder  . Amlodipine     Edema, also ineffective  . Demerol [Meperidine Hcl]     Emesis   . Lisinopril     Cough  . Losartan     Bladder pain  . Other     Surgical glue- rash, whelps  . Iodine  Other (See Comments)    Felt hot and flushed about 50 years ago after getting IVP dye.  No hives, no hypotension no sob. Likely normal reaction, given pretreatment protocol prior to discussion with patient of actual reaction.  NO reaction on CT    Social Hx:   Social History   Socioeconomic History  . Marital status: Married    Spouse name: Not on file  . Number of children: Not on file  . Years of education: Not on file  . Highest education level: Not on file  Occupational History  . 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 Financial resource strain: Not on file  . Food insecurity    Worry: Not on file    Inability: Not on file  . Transportation needs    Medical: Not on file    Non-medical: Not on file  Tobacco Use  . Smoking status: Never Smoker  . Smokeless tobacco: Never Used  Substance and Sexual Activity  . Alcohol use: Yes    Comment: Occasional  . Drug use: No  . Sexual activity: Not Currently  Lifestyle  . Physical activity    Days per week: Not on file    Minutes per session: Not on file  . Stress: Not on file  Relationships  . Social Herbalist on phone: Not on file    Gets together: Not on file    Attends religious service: Not on file    Active member of club or organization: Not on file    Attends meetings of clubs or organizations: Not on file    Relationship status: Not on file  . Intimate partner violence    Fear of current or ex partner: Not on file    Emotionally abused: Not on file    Physically abused: Not on file    Forced sexual activity: Not on file  Other Topics Concern  . Not on file  Social History Narrative   Married   Gets regular exercise    Past Surgical Hx:  Past Surgical History:  Procedure Laterality Date  . LAPAROSCOPIC CHOLECYSTECTOMY    . PARTIAL HYSTERECTOMY    . TONSILLECTOMY AND ADENOIDECTOMY      Past Medical Hx:  Past Medical  History:  Diagnosis Date  . Depression   . Fatigue   . GERD (gastroesophageal reflux disease)   . Hyperlipidemia    H/o myalgias with Vytorin but tolerating simvastatin  . Hypertension   . Myalgia    Hx of   . S/P cholecystectomy   . S/P hysterectomy   . Shingles     Past Gynecological History:  Endometriosis, PCOS, P0, infertility (primary). No LMP recorded. Patient has had a hysterectomy.  Family Hx:  Family History  Problem Relation Age of Onset  . Lung cancer Mother   . Cancer Father        Stomach  . Heart failure Maternal Grandmother 80  CHF  . Heart failure Maternal Grandfather 50       Acute MI    Review of Systems:  Constitutional  Feels fatigued, malaise  ENT Normal appearing ears and nares bilaterally Skin/Breast  No rash, sores, jaundice, itching, dryness Cardiovascular  No chest pain, shortness of breath, or edema  Pulmonary  No cough or wheeze.  Gastro Intestinal  + loose stools.  Genito Urinary  No frequency, urgency, dysuria,  Musculo Skeletal  No myalgia, arthralgia, joint swelling or pain  Neurologic  No weakness, numbness, change in gait,  Psychology  No depression, anxiety, insomnia.   Vitals:  Blood pressure 132/74, pulse (!) 120, temperature 98.3 F (36.8 C), temperature source Temporal, resp. rate 20, height 5\' 7"  (1.702 m), weight 183 lb (83 kg), SpO2 99 %.  Physical Exam: WD in NAD Neck  Supple NROM, without any enlargements.  Lymph Node Survey No cervical supraclavicular or inguinal adenopathy Cardiovascular  Pulse normal rate, regularity and rhythm. S1 and S2 normal.  Lungs  Clear to auscultation bilateraly, without wheezes/crackles/rhonchi. Good air movement.  Skin  No rash/lesions/breakdown  Psychiatry  Alert and oriented to person, place, and time  Abdomen  Normoactive bowel sounds, abdomen soft, non-tender and obese without evidence of hernia. No palpable masses. Back No CVA tenderness Genito Urinary   Vulva/vagina: Normal external female genitalia.  No lesions. No discharge or bleeding.  Bladder/urethra:  No lesions or masses, well supported bladder  Vagina: normal, smooth, no lesions, no masses  Cervix and uterus surgically absent  Adnexa: no palpable masses. Rectal  Good tone, no masses no cul de sac nodularity.  Extremities  No bilateral cyanosis, clubbing or edema.   Thereasa Solo, MD  04/17/2019, 12:55 PM

## 2019-04-17 NOTE — Patient Instructions (Signed)
Dr Denman George is considering the possibility of either stage 3 ovarian cancer or an infectious/inflamed process of the appendix and intestine.  She has ordered a paracentesis to test the fluid in your abdomen for cancer cells, pus or bacteria which will help determine the diagnosis.  She will draw tumor markers today. It is important to note that these can be elevated even if there is no cancer. If they are normal it is very unlikely that there is a cancer.  She will schedule an appointment with a medical oncologist, assuming that a cancer diagnosis will be made, however, if the paracentesis is negative for cancer we will cancel this appointment and an alternative treatment plan will be made.   Dr Serita Grit office can be reached at [phone].

## 2019-04-18 ENCOUNTER — Telehealth: Payer: Self-pay

## 2019-04-18 ENCOUNTER — Ambulatory Visit (HOSPITAL_COMMUNITY)
Admission: RE | Admit: 2019-04-18 | Discharge: 2019-04-18 | Disposition: A | Discharge status: Home / Self Care | Payer: Medicare HMO | Source: Ambulatory Visit | Attending: Gynecologic Oncology | Admitting: Gynecologic Oncology

## 2019-04-18 ENCOUNTER — Encounter: Payer: Self-pay | Admitting: Hematology and Oncology

## 2019-04-18 DIAGNOSIS — C801 Malignant (primary) neoplasm, unspecified: Secondary | ICD-10-CM | POA: Insufficient documentation

## 2019-04-18 DIAGNOSIS — R180 Malignant ascites: Secondary | ICD-10-CM

## 2019-04-18 DIAGNOSIS — C561 Malignant neoplasm of right ovary: Secondary | ICD-10-CM | POA: Insufficient documentation

## 2019-04-18 DIAGNOSIS — C786 Secondary malignant neoplasm of retroperitoneum and peritoneum: Secondary | ICD-10-CM | POA: Insufficient documentation

## 2019-04-18 LAB — GRAM STAIN

## 2019-04-18 LAB — BODY FLUID CELL COUNT WITH DIFFERENTIAL
Lymphs, Fluid: 24 %
Monocyte-Macrophage-Serous Fluid: 48 % — ABNORMAL LOW (ref 50–90)
Neutrophil Count, Fluid: 28 % — ABNORMAL HIGH (ref 0–25)
Total Nucleated Cell Count, Fluid: 4336 cu mm — ABNORMAL HIGH (ref 0–1000)

## 2019-04-18 LAB — CA 125: Cancer Antigen (CA) 125: 288 U/mL — ABNORMAL HIGH (ref 0.0–38.1)

## 2019-04-18 MED ORDER — LIDOCAINE HCL 1 % IJ SOLN
INTRAMUSCULAR | Status: AC
  Filled 2019-04-18: qty 10

## 2019-04-18 NOTE — Telephone Encounter (Signed)
I spoke with Connie Heath and told her the CEA was normal and the CA 125 was 288.0.  I told her high normal for CA 125 is 38.1.  I told her this elevation can be indicative of cancer or infection.  We will await paracentesis results.  She states that she is feeling much better since the paracentesis this am.

## 2019-04-18 NOTE — Procedures (Signed)
Ultrasound-guided diagnostic and therapeutic paracentesis performed yielding 3.2 liters of blood-tinged fluid. No immediate complications.  A portion of the fluid was submitted to the lab for preordered studies. EBL none.

## 2019-04-19 ENCOUNTER — Other Ambulatory Visit: Payer: Self-pay

## 2019-04-20 ENCOUNTER — Other Ambulatory Visit: Payer: Self-pay

## 2019-04-20 ENCOUNTER — Encounter: Payer: Self-pay | Admitting: Hematology and Oncology

## 2019-04-20 ENCOUNTER — Telehealth: Payer: Self-pay | Admitting: Oncology

## 2019-04-20 ENCOUNTER — Inpatient Hospital Stay: Payer: Medicare HMO

## 2019-04-20 ENCOUNTER — Inpatient Hospital Stay (HOSPITAL_BASED_OUTPATIENT_CLINIC_OR_DEPARTMENT_OTHER): Payer: Medicare HMO | Admitting: Hematology and Oncology

## 2019-04-20 ENCOUNTER — Telehealth: Payer: Self-pay | Admitting: Hematology and Oncology

## 2019-04-20 ENCOUNTER — Other Ambulatory Visit: Payer: Self-pay | Admitting: Hematology and Oncology

## 2019-04-20 VITALS — BP 117/74 | HR 118 | Temp 98.2°F | Resp 18 | Ht 67.0 in | Wt 176.6 lb

## 2019-04-20 DIAGNOSIS — C786 Secondary malignant neoplasm of retroperitoneum and peritoneum: Secondary | ICD-10-CM

## 2019-04-20 DIAGNOSIS — D72829 Elevated white blood cell count, unspecified: Secondary | ICD-10-CM

## 2019-04-20 DIAGNOSIS — I700 Atherosclerosis of aorta: Secondary | ICD-10-CM

## 2019-04-20 DIAGNOSIS — C801 Malignant (primary) neoplasm, unspecified: Secondary | ICD-10-CM

## 2019-04-20 DIAGNOSIS — R7989 Other specified abnormal findings of blood chemistry: Secondary | ICD-10-CM | POA: Diagnosis not present

## 2019-04-20 DIAGNOSIS — Z888 Allergy status to other drugs, medicaments and biological substances status: Secondary | ICD-10-CM

## 2019-04-20 DIAGNOSIS — R140 Abdominal distension (gaseous): Secondary | ICD-10-CM

## 2019-04-20 DIAGNOSIS — Z90710 Acquired absence of both cervix and uterus: Secondary | ICD-10-CM

## 2019-04-20 DIAGNOSIS — Z79899 Other long term (current) drug therapy: Secondary | ICD-10-CM

## 2019-04-20 DIAGNOSIS — J90 Pleural effusion, not elsewhere classified: Secondary | ICD-10-CM

## 2019-04-20 DIAGNOSIS — R194 Change in bowel habit: Secondary | ICD-10-CM

## 2019-04-20 DIAGNOSIS — K449 Diaphragmatic hernia without obstruction or gangrene: Secondary | ICD-10-CM

## 2019-04-20 DIAGNOSIS — G893 Neoplasm related pain (acute) (chronic): Secondary | ICD-10-CM | POA: Diagnosis not present

## 2019-04-20 DIAGNOSIS — D75838 Other thrombocytosis: Secondary | ICD-10-CM | POA: Insufficient documentation

## 2019-04-20 DIAGNOSIS — Z885 Allergy status to narcotic agent status: Secondary | ICD-10-CM

## 2019-04-20 DIAGNOSIS — R630 Anorexia: Secondary | ICD-10-CM

## 2019-04-20 DIAGNOSIS — R180 Malignant ascites: Secondary | ICD-10-CM

## 2019-04-20 DIAGNOSIS — Z5111 Encounter for antineoplastic chemotherapy: Secondary | ICD-10-CM | POA: Diagnosis not present

## 2019-04-20 DIAGNOSIS — Z8249 Family history of ischemic heart disease and other diseases of the circulatory system: Secondary | ICD-10-CM

## 2019-04-20 MED ORDER — OXYCODONE HCL 5 MG PO TABS: 5.0000 mg | ORAL_TABLET | ORAL | 0 refills | Status: DC | PRN

## 2019-04-20 MED ORDER — PROCHLORPERAZINE MALEATE 10 MG PO TABS: 10.0000 mg | ORAL_TABLET | Freq: Four times a day (QID) | ORAL | 1 refills | Status: DC | PRN

## 2019-04-20 MED ORDER — DEXAMETHASONE 4 MG PO TABS: ORAL_TABLET | ORAL | 0 refills | Status: DC

## 2019-04-20 MED ORDER — LIDOCAINE-PRILOCAINE 2.5-2.5 % EX CREA: TOPICAL_CREAM | CUTANEOUS | 3 refills | Status: DC

## 2019-04-20 MED ORDER — ONDANSETRON 8 MG PO TBDP: 8.0000 mg | ORAL_TABLET | Freq: Three times a day (TID) | ORAL | 1 refills | Status: DC | PRN

## 2019-04-20 MED FILL — PROCHLORPERAZINE 10 MG TAB: 10 | 7 days supply | Qty: 30 | Fill #0

## 2019-04-20 MED FILL — DEXAMETHASONE 4 MG TABLET: 4 | 84 days supply | Qty: 16 | Fill #0

## 2019-04-20 MED FILL — oxyCODONE HCL 5 MG TABS: 5 | 10 days supply | Qty: 60 | Fill #0

## 2019-04-20 MED FILL — LIDOCAINE-PRILOCAINE CREAM: 2.5-2.5 | 10 days supply | Qty: 30 | Fill #0

## 2019-04-20 MED FILL — ONDANSETRON ODT 8 MG TABLET: 8 | 10 days supply | Qty: 30 | Fill #0

## 2019-04-20 NOTE — Progress Notes (Signed)
START ON PATHWAY REGIMEN - Ovarian     A cycle is every 21 days:     Paclitaxel      Carboplatin   **Always confirm dose/schedule in your pharmacy ordering system**  Patient Characteristics: Preoperative or Nonsurgical Candidate (Clinical Staging), Newly Diagnosed, Neoadjuvant Therapy followed by Surgery Therapeutic Status: Preoperative or Nonsurgical Candidate (Clinical Staging) BRCA Mutation Status: Awaiting Test Results AJCC T Category: cT3 AJCC 8 Stage Grouping: Unknown AJCC N Category: cN0 AJCC M Category: cM0 Therapy Plan: Neoadjuvant Therapy followed by Surgery Intent of Therapy: Curative Intent, Discussed with Patient 

## 2019-04-20 NOTE — Assessment & Plan Note (Signed)
This is likely reactive She was prescribed antibiotics treatment and will be completing it in 2 - 3 days

## 2019-04-20 NOTE — Assessment & Plan Note (Signed)
This is likely reactive thrombocytosis Observe only for now

## 2019-04-20 NOTE — Assessment & Plan Note (Signed)
She has detectable abdominal ascites despite recent paracentesis She is currently not symptomatic Observe only She will call me if she felt uncomfortable requiring repeat paracentesis again

## 2019-04-20 NOTE — Assessment & Plan Note (Signed)
She has severe intermittent abdominal pain secondary to her disease I recommend switching her pain medicine to oxycodone as needed We discussed narcotic refill policy and I warned her about risk of nausea and constipation

## 2019-04-20 NOTE — Telephone Encounter (Signed)
Connie Heath called and asked if her pathology is back yet. Advised her that it is still pending and that she should still attend the appointment with Dr. Alvy Bimler today.  She verbalized understanding and agreement.

## 2019-04-20 NOTE — Telephone Encounter (Signed)
Confirmed December/January appointments with patient.  °

## 2019-04-20 NOTE — Progress Notes (Signed)
[place] CONSULT NOTE  Patient Care Team: Larene Beach, MD as PCP - General (Family Medicine) Awanda Mink Craige Cotta, RN as Oncology Nurse Navigator (Oncology)  ASSESSMENT & PLAN:  Peritoneal carcinomatosis [facility]) Even though the fluid cytology is pending, her clinical presentation, abnormal elevated tumor marker and CT imaging are highly suspicious for locally advanced ovarian cancer I am hopeful that the pathology results will be available early next week We discussed neoadjuvant chemotherapy approach I recommend chemo education class, port placement and for her to proceed with chemotherapy as soon as possible  We reviewed the NCCN guidelines We discussed the role of chemotherapy. The intent is of curative intent.  We discussed some of the risks, benefits, side-effects of carboplatin & Taxol. Treatment is intravenous, every 3 weeks x 6 cycles  Some of the short term side-effects included, though not limited to, including weight loss, life threatening infections, risk of allergic reactions, need for transfusions of blood products, nausea, vomiting, change in bowel habits, loss of hair, admission to hospital for various reasons, and risks of death.   Long term side-effects are also discussed including risks of infertility, permanent damage to nerve function, hearing loss, chronic fatigue, kidney damage with possibility needing hemodialysis, and rare secondary malignancy including bone marrow disorders.  The patient is aware that the response rates discussed earlier is not guaranteed.  After a long discussion, patient made an informed decision to proceed with the prescribed plan of care.   Patient education material was dispensed. We discussed premedication with dexamethasone before chemotherapy. I do not plan prophylactic G-CSF support I recommend monthly tumor marker monitoring I recommend CT imaging to be repeated after cycle 3 of treatment If she have excellent response to  therapy, we will proceed with interval debulking surgery followed by 3 more cycles of treatment She will qualify for genetic counseling and genetic testing but I would like to wait until after interval debulking surgery before referring her to see genetic counselor  Reactive thrombocytosis This is likely reactive thrombocytosis Observe only for now  Leukocytosis This is likely reactive She was prescribed antibiotics treatment and will be completing it in 2 - 3 days  Cancer associated pain She has severe intermittent abdominal pain secondary to her disease I recommend switching her pain medicine to oxycodone as needed We discussed narcotic refill policy and I warned her about risk of nausea and constipation  Malignant ascites She has detectable abdominal ascites despite recent paracentesis She is currently not symptomatic Observe only She will call me if she felt uncomfortable requiring repeat paracentesis again   Orders Placed This Encounter  Procedures  . IR IMAGING GUIDED PORT INSERTION    Standing Status:   Future    Standing Expiration Date:   06/20/2020    Order Specific Question:   Reason for Exam (SYMPTOM  OR DIAGNOSIS REQUIRED)    Answer:   need port ASAP for chemo to start 12/11    Order Specific Question:   Preferred Imaging Location?    Answer:   [facility]     CHIEF COMPLAINTS/PURPOSE OF CONSULTATION:  Newly diagnosed ovarian cancer with malignant ascites, for neoadjuvant treatment Connie Heath is here today with her husband, Connie Heath  HISTORY OF PRESENTING ILLNESS:  Connie Heath 68 y.o. female is here because of recent findings of malignant ascites and CT imaging findings, worrisome for ovarian cancer The patient had remote history of hysterectomy for endometriosis She started to have abdominal discomfort, mild changes in bowel habits, anorexia  and abdominal bloating with distention approximately a month ago She denies vaginal bleeding or discharge She  attempted to lose weight through dietary changes but her weight plateaued approximately a month ago when she started to have progressive abdominal distention and weight gain She also complained of intermittent abdominal pain that comes and goes especially in the right lower quadrant region She rated her pain at 3 out of 10 but it could be as severe as 9 out of 10 She was prescribed a small dose of Percocet that helps with her pain I have reviewed her chart and materials related to her cancer extensively and collaborated history with the patient. Summary of oncologic history is as follows: Oncology History  Peritoneal carcinomatosis ([place])  01/16/2019 Initial Diagnosis   She had vague symptom of dyspareunia. Presented to ER for evaluation in November for abdominal pain and vague symptom of fullness and imaging showed abnormalities   04/16/2019 Imaging   1. Nonvisualization of the appendix. However, there is a large volume of ascites within the abdomen and pelvis which appears partially loculated. In the acute setting findings may be the sequelae of peritonitis. However, there are multiple partially calcified peritoneal nodules throughout the omentum which are concerning for peritoneal carcinomatosis. Noncalcified nodule in the left lower quadrant peritoneal reflection is noted. Further investigation with diagnostic paracentesis is advised. 2. Ill-defined low-density mass within the right iliac fossa is suspected and may represent an ovarian neoplasm. 3. Aortic atherosclerosis. 4. Small bilateral pleural effusions. 5. Hiatal hernia. 6. Prior granulomatous disease.   Aortic Atherosclerosis (ICD10-I70.0).   04/17/2019 Tumor Marker   Patient's tumor was tested for the following markers: CA-125 Results of the tumor marker test revealed 288.   04/18/2019 Procedure   Successful ultrasound-guided diagnostic and therapeutic paracentesis yielding 3.2 liters of peritoneal fluid.   04/20/2019 Cancer Staging    Staging form: Ovary, Fallopian Tube, and Primary Peritoneal Carcinoma, AJCC 8th Edition - Clinical stage from 04/20/2019: FIGO Stage IIIC (cT3c, cN0, cM0) - Signed by [name], MD on 04/20/2019     MEDICAL HISTORY:  Past Medical History:  Diagnosis Date  . Depression   . Fatigue   . GERD (gastroesophageal reflux disease)   . Hyperlipidemia    H/o myalgias with Vytorin but tolerating simvastatin  . Hypertension   . Myalgia    Hx of   . S/P cholecystectomy   . S/P hysterectomy   . Shingles     SURGICAL HISTORY: Past Surgical History:  Procedure Laterality Date  . LAPAROSCOPIC CHOLECYSTECTOMY    . PARTIAL HYSTERECTOMY    . TONSILLECTOMY AND ADENOIDECTOMY      SOCIAL HISTORY: Social History   Socioeconomic History  . Marital status: Married    Spouse name: Not on file  . Number of children: Not on file  . Years of education: Not on file  . Highest education level: Not on file  Occupational History  . 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 Financial resource strain: Not on file  . Food insecurity    Worry: Not on file    Inability: Not on file  . Transportation needs    Medical: Not on file    Non-medical: Not on file  Tobacco Use  . Smoking status: Never Smoker  . Smokeless tobacco: Never Used  Substance and Sexual Activity  . Alcohol use: Yes    Comment: Occasional  . Drug use: No  .  Sexual activity: Not Currently  Lifestyle  . Physical activity    Days per week: Not on file    Minutes per session: Not on file  . Stress: Not on file  Relationships  . Social Herbalist on phone: Not on file    Gets together: Not on file    Attends religious service: Not on file    Active member of club or organization: Not on file    Attends meetings of clubs or organizations: Not on file    Relationship status: Not on file  . Intimate partner violence    Fear of current or ex  partner: Not on file    Emotionally abused: Not on file    Physically abused: Not on file    Forced sexual activity: Not on file  Other Topics Concern  . Not on file  Social History Narrative   Married   Gets regular exercise    FAMILY HISTORY: Family History  Problem Relation Age of Onset  . Lung cancer Mother   . Cancer Father 48       Stomach  . Heart failure Maternal Grandmother 80       CHF  . Heart failure Maternal Grandfather 50       Acute MI    ALLERGIES:  is allergic to bee venom; iodinated diagnostic agents; amlodipine; demerol [meperidine hcl]; lisinopril; losartan; other; and iodine.  MEDICATIONS:  Current Outpatient Medications  Medication Sig Dispense Refill  . amoxicillin-clavulanate (AUGMENTIN) 875-125 MG tablet Take by mouth.    Marland Kitchen aspirin EC 81 MG tablet Take 81 mg by mouth daily.    . Cholecalciferol (VITAMIN D-3 PO) Take 4,000 Int'l Units by mouth.    . dexamethasone (DECADRON) 4 MG tablet Take 2 tabs at the night before and 2 tab the morning of chemotherapy, every 3 weeks, by mouth. Please dispense 24 tabs for total 6 cycles 60 tablet 0  . FLUoxetine (PROZAC) 20 MG capsule Take 1 capsule (20 mg total) by mouth daily. 90 capsule 3  . lidocaine-prilocaine (EMLA) cream Apply to affected area once 30 g 3  . ondansetron (ZOFRAN-ODT) 8 MG disintegrating tablet Take 1 tablet (8 mg total) by mouth every 8 (eight) hours as needed for nausea or vomiting. 30 tablet 1  . oxyCODONE (OXY IR/ROXICODONE) 5 MG immediate release tablet Take 1 tablet (5 mg total) by mouth every 4 (four) hours as needed for severe pain. 60 tablet 0  . prochlorperazine (COMPAZINE) 10 MG tablet Take 1 tablet (10 mg total) by mouth every 6 (six) hours as needed (Nausea or vomiting). 30 tablet 1  . simvastatin (ZOCOR) 40 MG tablet Take 1 tablet (40 mg total) by mouth at bedtime. 90 tablet 4  . VIRTUSSIN A/C 100-10 MG/5ML syrup SMARTSIG:5-10 Milliliter(s) By Mouth Every Night PRN     No current  facility-administered medications for this visit.     REVIEW OF SYSTEMS:   Constitutional: Denies fevers, chills or abnormal night sweats Eyes: Denies blurriness of vision, double vision or watery eyes Ears, nose, mouth, throat, and face: Denies mucositis or sore throat Respiratory: Denies cough, dyspnea or wheezes Cardiovascular: Denies palpitation, chest discomfort or lower extremity swelling Skin: Denies abnormal skin rashes Lymphatics: Denies new lymphadenopathy or easy bruising Neurological:Denies numbness, tingling or new weaknesses Behavioral/Psych: Mood is stable, no new changes  All other systems were reviewed with the patient and are negative.  PHYSICAL EXAMINATION: ECOG PERFORMANCE STATUS: 1 - Symptomatic but completely ambulatory  Vitals:   04/20/19 1144  BP: 117/74  Heath: (!) 118  Resp: 18  Temp: 98.2 F (36.8 C)  SpO2: 99%   Filed Weights   04/20/19 1144  Weight: 176 lb 9.6 oz (80.1 kg)    GENERAL:alert, no distress and comfortable SKIN: skin color, texture, turgor are normal, no rashes or significant lesions EYES: normal, conjunctiva are pink and non-injected, sclera clear OROPHARYNX:no exudate, no erythema and lips, buccal mucosa, and tongue normal  NECK: supple, thyroid normal size, non-tender, without nodularity LYMPH:  no palpable lymphadenopathy in the cervical, axillary or inguinal LUNGS: clear to auscultation and percussion with normal breathing effort HEART: regular rate & rhythm and no murmurs and no lower extremity edema ABDOMEN:abdomen soft, distended with ascites.  Palpable suprapubic mass Musculoskeletal:no cyanosis of digits and no clubbing  PSYCH: alert & oriented x 3 with fluent speech NEURO: no focal motor/sensory deficits  LABORATORY DATA:  I have reviewed the data as listed Lab Results  Component Value Date   WBC 12.7 (H) 04/16/2019   HGB 11.2 (L) 04/16/2019   HCT 32.9 (L) 04/16/2019   MCV 87.7 04/16/2019   PLT 759 (H) 04/16/2019    Recent Labs    04/16/19 0949  NA 131*  K 3.9  CL 93*  CO2 24  GLUCOSE 111*  BUN 7*  CREATININE 0.97  CALCIUM 9.2  GFRNONAA >60  GFRAA >60  PROT 7.0  ALBUMIN 2.2*  AST 41  ALT 41  ALKPHOS 184*  BILITOT 0.7    RADIOGRAPHIC STUDIES: I have personally reviewed the radiological images as listed and agreed with the findings in the report. Ct Abdomen Pelvis W Contrast  Result Date: 04/16/2019 CLINICAL DATA:  Abdominal pain. Evaluate for appendicitis. EXAM: CT ABDOMEN AND PELVIS WITH CONTRAST TECHNIQUE: Multidetector CT imaging of the abdomen and pelvis was performed using the standard protocol following bolus administration of intravenous contrast. CONTRAST:  1110mL OMNIPAQUE IOHEXOL 300 MG/ML  SOLN COMPARISON:  None. FINDINGS: Lower chest: Calcified mediastinal and hilar lymph nodes are identified. There are small bilateral pleural effusions. Subsegmental atelectasis identified in the posterior right lower lobe. Hepatobiliary: No suspicious liver abnormality identified. Previous cholecystectomy. No intrahepatic bile duct or common bile duct dilatation. Pancreas: Unremarkable. No pancreatic ductal dilatation or surrounding inflammatory changes. Spleen: Calcified granulomas within normal size spleen. Adrenals/Urinary Tract: The adrenal glands are unremarkable. No kidney mass or hydronephrosis identified. The urinary bladder is largely obscured by beam hardening artifact from right hip arthroplasty device. Stomach/Bowel: Moderate size hiatal hernia. Stomach is nondistended. There is no small bowel distension, wall thickening or inflammation. No pathologic dilatation of the colon. The appendix is not confidently identified Vascular/Lymphatic: Mild aortic atherosclerosis. No aneurysm. No abdominopelvic adenopathy identified. Reproductive: Status post hysterectomy. Other: There is a large volume of ascites within the abdomen and pelvis which appears partially loculated. Additionally, there are  scattered areas of what appears to be soft tissue nodularity. This includes a peritoneal nodule in the left lower quadrant of the abdomen measuring 2.1 cm, image 106/7. Multiple small partially calcified peritoneal nodules are identified throughout the omentum, image 28/6. Ill-defined low-density mass within the right iliac fossa is suspected measuring approximately 10.0 x 6.8 by 6.2 cm. This contains wispy, internal areas of soft tissue attenuation. Musculoskeletal: No acute or significant osseous findings. IMPRESSION: 1. Nonvisualization of the appendix. However, there is a large volume of ascites within the abdomen and pelvis which appears partially loculated. In the acute setting findings may be the sequelae of peritonitis. However,  there are multiple partially calcified peritoneal nodules throughout the omentum which are concerning for peritoneal carcinomatosis. Noncalcified nodule in the left lower quadrant peritoneal reflection is noted. Further investigation with diagnostic paracentesis is advised. 2. Ill-defined low-density mass within the right iliac fossa is suspected and may represent an ovarian neoplasm. 3. Aortic atherosclerosis. 4. Small bilateral pleural effusions. 5. Hiatal hernia. 6. Prior granulomatous disease. Aortic Atherosclerosis (ICD10-I70.0). Electronically Signed   By: Kerby Moors M.D.   On: 04/16/2019 17:25   US Paracentesis  Result Date: 04/18/2019 INDICATION: Patient with history of right iliac fossa mass, peritoneal nodules, ascites, elevated CA 125; request received for diagnostic and therapeutic paracentesis. EXAM: ULTRASOUND GUIDED DIAGNOSTIC AND THERAPEUTIC PARACENTESIS MEDICATIONS: None COMPLICATIONS: None immediate. PROCEDURE: Informed written consent was obtained from the patient after a discussion of the risks, benefits and alternatives to treatment. A timeout was performed prior to the initiation of the procedure. Initial ultrasound scanning demonstrates a large amount  of ascites within the right lower abdominal quadrant. The right lower abdomen was prepped and draped in the usual sterile fashion. 1% lidocaine was used for local anesthesia. Following this, a 19 gauge, 10-cm, Yueh catheter was introduced. An ultrasound image was saved for documentation purposes. The paracentesis was performed. The catheter was removed and a dressing was applied. The patient tolerated the procedure well without immediate post procedural complication. FINDINGS: A total of approximately 3.2 liters of blood-tinged fluid was removed. Samples were sent to the laboratory as requested by the clinical team. IMPRESSION: Successful ultrasound-guided diagnostic and therapeutic paracentesis yielding 3.2 liters of peritoneal fluid. Read by: Rowe Robert, PA-C Electronically Signed   By: Jacqulynn Cadet M.D.   On: 04/18/2019 11:49   Dg Chest Portable 1 View  Result Date: 04/16/2019 CLINICAL DATA:  Cough. EXAM: PORTABLE CHEST 1 VIEW COMPARISON:  June 25, 2013. FINDINGS: The heart size and mediastinal contours are within normal limits. Both lungs are clear. The visualized skeletal structures are unremarkable. IMPRESSION: No active disease. Electronically Signed   By: Marijo Conception M.D.   On: 04/16/2019 12:56    I spent 60 minutes counseling the patient face to face. The total time spent in the appointment was 60 minutes and more than 50% was on counseling.  All questions were answered. The patient knows to call the clinic with any problems, questions or concerns.  [name], MD 04/20/2019 1:03 PM

## 2019-04-20 NOTE — Assessment & Plan Note (Signed)
Even though the fluid cytology is pending, her clinical presentation, abnormal elevated tumor marker and CT imaging are highly suspicious for locally advanced ovarian cancer I am hopeful that the pathology results will be available early next week We discussed neoadjuvant chemotherapy approach I recommend chemo education class, port placement and for her to proceed with chemotherapy as soon as possible  We reviewed the NCCN guidelines We discussed the role of chemotherapy. The intent is of curative intent.  We discussed some of the risks, benefits, side-effects of carboplatin & Taxol. Treatment is intravenous, every 3 weeks x 6 cycles  Some of the short term side-effects included, though not limited to, including weight loss, life threatening infections, risk of allergic reactions, need for transfusions of blood products, nausea, vomiting, change in bowel habits, loss of hair, admission to hospital for various reasons, and risks of death.   Long term side-effects are also discussed including risks of infertility, permanent damage to nerve function, hearing loss, chronic fatigue, kidney damage with possibility needing hemodialysis, and rare secondary malignancy including bone marrow disorders.  The patient is aware that the response rates discussed earlier is not guaranteed.  After a long discussion, patient made an informed decision to proceed with the prescribed plan of care.   Patient education material was dispensed. We discussed premedication with dexamethasone before chemotherapy. I do not plan prophylactic G-CSF support I recommend monthly tumor marker monitoring I recommend CT imaging to be repeated after cycle 3 of treatment If she have excellent response to therapy, we will proceed with interval debulking surgery followed by 3 more cycles of treatment She will qualify for genetic counseling and genetic testing but I would like to wait until after interval debulking surgery before  referring her to see genetic counselor

## 2019-04-23 ENCOUNTER — Telehealth: Payer: Self-pay

## 2019-04-23 ENCOUNTER — Other Ambulatory Visit: Payer: Self-pay | Admitting: Hematology and Oncology

## 2019-04-23 DIAGNOSIS — C801 Malignant (primary) neoplasm, unspecified: Secondary | ICD-10-CM

## 2019-04-23 DIAGNOSIS — R180 Malignant ascites: Secondary | ICD-10-CM

## 2019-04-23 DIAGNOSIS — C786 Secondary malignant neoplasm of retroperitoneum and peritoneum: Secondary | ICD-10-CM

## 2019-04-23 LAB — CULTURE, BODY FLUID W GRAM STAIN -BOTTLE: Culture: NO GROWTH

## 2019-04-23 NOTE — Telephone Encounter (Signed)
She called and left a message.  Called back. She is starting to accumulate fluid in her abdomen again. She has developed a cough like before. Abdomen is distended but not like before last paracentesis. She is asking do you every order lasix or aldactone? Does she need another paracentesis?

## 2019-04-23 NOTE — Telephone Encounter (Signed)
Paracentesis scheduled for 12/9 at 11 am, arrive at 1045.  Called her and given above appt information. She verbalized understanding.

## 2019-04-23 NOTE — Telephone Encounter (Signed)
I suggest repeat paracentesis I placed order, please call to schedule next 2 days

## 2019-04-24 ENCOUNTER — Telehealth: Payer: Self-pay | Admitting: Gynecologic Oncology

## 2019-04-24 LAB — CYTOLOGY - NON PAP

## 2019-04-24 NOTE — Telephone Encounter (Signed)
Contacted patient and informed her of cytology results.  All questions answered. Answered questions in regards to CEA and CA 125 values.  No concerns voiced. Advised to call for any needs.

## 2019-04-24 NOTE — Telephone Encounter (Signed)
Duplicate

## 2019-04-25 ENCOUNTER — Other Ambulatory Visit: Payer: Self-pay | Admitting: Radiology

## 2019-04-25 ENCOUNTER — Ambulatory Visit (HOSPITAL_COMMUNITY)
Admission: RE | Admit: 2019-04-25 | Discharge: 2019-04-25 | Disposition: A | Discharge status: Home / Self Care | Payer: Medicare HMO | Source: Ambulatory Visit | Attending: Hematology and Oncology | Admitting: Hematology and Oncology

## 2019-04-25 ENCOUNTER — Other Ambulatory Visit: Payer: Self-pay

## 2019-04-25 DIAGNOSIS — C801 Malignant (primary) neoplasm, unspecified: Secondary | ICD-10-CM | POA: Diagnosis present

## 2019-04-25 DIAGNOSIS — C786 Secondary malignant neoplasm of retroperitoneum and peritoneum: Secondary | ICD-10-CM | POA: Diagnosis not present

## 2019-04-25 DIAGNOSIS — Z9889 Other specified postprocedural states: Secondary | ICD-10-CM

## 2019-04-25 DIAGNOSIS — R180 Malignant ascites: Secondary | ICD-10-CM

## 2019-04-25 HISTORY — DX: Other specified postprocedural states: Z98.890

## 2019-04-25 MED ORDER — LIDOCAINE HCL 1 % IJ SOLN
INTRAMUSCULAR | Status: AC
  Filled 2019-04-25: qty 20

## 2019-04-25 NOTE — Procedures (Signed)
Ultrasound-guided  therapeutic paracentesis performed yielding 2.5 liters of blood-tinged fluid. No immediate complications.EBL none.

## 2019-04-26 ENCOUNTER — Ambulatory Visit (HOSPITAL_COMMUNITY)
Admission: RE | Admit: 2019-04-26 | Discharge: 2019-04-26 | Disposition: A | Discharge status: Home / Self Care | Payer: Medicare HMO | Source: Ambulatory Visit | Attending: Hematology and Oncology | Admitting: Hematology and Oncology

## 2019-04-26 ENCOUNTER — Encounter (HOSPITAL_COMMUNITY): Payer: Self-pay

## 2019-04-26 ENCOUNTER — Encounter: Payer: Self-pay | Admitting: Hematology and Oncology

## 2019-04-26 ENCOUNTER — Telehealth: Payer: Self-pay | Admitting: *Deleted

## 2019-04-26 ENCOUNTER — Other Ambulatory Visit: Payer: Self-pay

## 2019-04-26 ENCOUNTER — Other Ambulatory Visit: Payer: Self-pay | Admitting: Hematology and Oncology

## 2019-04-26 DIAGNOSIS — C786 Secondary malignant neoplasm of retroperitoneum and peritoneum: Secondary | ICD-10-CM | POA: Diagnosis not present

## 2019-04-26 DIAGNOSIS — C801 Malignant (primary) neoplasm, unspecified: Secondary | ICD-10-CM

## 2019-04-26 LAB — CBC WITH DIFFERENTIAL/PLATELET
Abs Immature Granulocytes: 0.11 10*3/uL — ABNORMAL HIGH (ref 0.00–0.07)
Basophils Absolute: 0.1 10*3/uL (ref 0.0–0.1)
Basophils Relative: 1 %
Eosinophils Absolute: 0.2 10*3/uL (ref 0.0–0.5)
Eosinophils Relative: 2 %
HCT: 34.1 % — ABNORMAL LOW (ref 36.0–46.0)
Hemoglobin: 11.1 g/dL — ABNORMAL LOW (ref 12.0–15.0)
Immature Granulocytes: 1 %
Lymphocytes Relative: 12 %
Lymphs Abs: 1.8 10*3/uL (ref 0.7–4.0)
MCH: 29.3 pg (ref 26.0–34.0)
MCHC: 32.6 g/dL (ref 30.0–36.0)
MCV: 90 fL (ref 80.0–100.0)
Monocytes Absolute: 1.7 10*3/uL — ABNORMAL HIGH (ref 0.1–1.0)
Monocytes Relative: 12 %
Neutro Abs: 10.3 10*3/uL — ABNORMAL HIGH (ref 1.7–7.7)
Neutrophils Relative %: 72 %
Platelets: 852 10*3/uL — ABNORMAL HIGH (ref 150–400)
RBC: 3.79 MIL/uL — ABNORMAL LOW (ref 3.87–5.11)
RDW: 14.6 % (ref 11.5–15.5)
WBC: 14.1 10*3/uL — ABNORMAL HIGH (ref 4.0–10.5)
nRBC: 0 % (ref 0.0–0.2)

## 2019-04-26 LAB — BASIC METABOLIC PANEL
Anion gap: 12 (ref 5–15)
BUN: 5 mg/dL — ABNORMAL LOW (ref 8–23)
CO2: 24 mmol/L (ref 22–32)
Calcium: 8.7 mg/dL — ABNORMAL LOW (ref 8.9–10.3)
Chloride: 97 mmol/L — ABNORMAL LOW (ref 98–111)
Creatinine, Ser: 0.86 mg/dL (ref 0.44–1.00)
GFR calc Af Amer: >60 mL/min (ref >60)
GFR calc non Af Amer: >60 mL/min (ref >60)
Glucose, Bld: 104 mg/dL — ABNORMAL HIGH (ref 70–99)
Potassium: 4.3 mmol/L (ref 3.5–5.1)
Sodium: 133 mmol/L — ABNORMAL LOW (ref 135–145)

## 2019-04-26 LAB — PROTIME-INR
INR: 1 (ref 0.8–1.2)
Prothrombin Time: 12.9 seconds (ref 11.4–15.2)

## 2019-04-26 MED ORDER — CEFAZOLIN SODIUM-DEXTROSE 2-4 GM/100ML-% IV SOLN: 2.0000 g | INTRAVENOUS | Status: DC

## 2019-04-26 MED ORDER — SODIUM CHLORIDE 0.9 % IV SOLN
INTRAVENOUS | Status: DC
  Administered 2019-04-26 (×2): via INTRAVENOUS

## 2019-04-26 NOTE — Progress Notes (Signed)
Correction:  We discussed the [employer] grant not the [employer].

## 2019-04-26 NOTE — Progress Notes (Signed)
Called pt to introduce myself as her Arboriculturist.  Unfortunately there aren't any foundations offering copay assistance for her Dx and the type of ins she has.  I offered the [place], went over what it covers and gave her the income requirement.  She stated she exceeds the income requirement for this year but she may meet it for 2021 so I will request for the registration staff give her my card in case her situation changes and she would like to apply for the grant later and for any questions or concerns she may have in the future.

## 2019-04-26 NOTE — H&P (Signed)
Chief Complaint: Patient was seen in consultation today for ovarian cancer/Port-a-cath placement.  Referring Physician(s): [name]  Supervising Physician: Corrie Mckusick  Patient Status: [facility] - Out-pt  History of Present Illness: Connie Heath is a 68 y.o. female with a past medical history of hypertension, hyperlipidemia, GERD, metastatic ovarian cancer (malignant ascites and peritoneal carcinomatosis), shingles, and depression. She was unfortunately diagnosed with metastatic ovarian cancer in 01/2019. Her cancer is managed by Dr. Alvy Bimler. She has tentative plans to begin systemic chemotherapy for management.  IR requested by Dr. Alvy Bimler for possible image-guided Port-a-cath placement. Patient awake and alert sitting in bed with no complaints at this time. Denies fever, chills, chest pain, dyspnea, abdominal pain, or headache.   Past Medical History:  Diagnosis Date   Depression    Fatigue    GERD (gastroesophageal reflux disease)    Hyperlipidemia    H/o myalgias with Vytorin but tolerating simvastatin   Hypertension    Myalgia    Hx of    S/P abdominal paracentesis 04/25/2019   pulled off 2.5 liters   S/P cholecystectomy    S/P hysterectomy    Shingles     Past Surgical History:  Procedure Laterality Date   LAPAROSCOPIC CHOLECYSTECTOMY     PARTIAL HYSTERECTOMY     TONSILLECTOMY AND ADENOIDECTOMY      Allergies: Bee venom, Iodinated diagnostic agents, Amlodipine, Demerol [meperidine hcl], Lisinopril, Losartan, Other, and Iodine  Medications: Prior to Admission medications   Medication Sig Start Date End Date Taking? Authorizing Provider  oxyCODONE (OXY IR/ROXICODONE) 5 MG immediate release tablet Take 1 tablet (5 mg total) by mouth every 4 (four) hours as needed for severe pain. 04/20/19  Yes [name], MD  aspirin EC 81 MG tablet Take 81 mg by mouth daily.    [provider]  Cholecalciferol (VITAMIN D-3 PO) Take 4,000 Int'l Units by  mouth.    [provider]  dexamethasone (DECADRON) 4 MG tablet Take 2 tabs at the night before and 2 tab the morning of chemotherapy, every 3 weeks, by mouth. Please dispense 24 tabs for total 6 cycles 04/20/19   [name], MD  FLUoxetine (PROZAC) 20 MG capsule Take 1 capsule (20 mg total) by mouth daily. 12/16/15   Iran Planas L, PA-C  lidocaine-prilocaine (EMLA) cream Apply to affected area once 04/20/19   [name], MD  ondansetron (ZOFRAN-ODT) 8 MG disintegrating tablet Take 1 tablet (8 mg total) by mouth every 8 (eight) hours as needed for nausea or vomiting. 04/20/19   [name], MD  prochlorperazine (COMPAZINE) 10 MG tablet Take 1 tablet (10 mg total) by mouth every 6 (six) hours as needed (Nausea or vomiting). 04/20/19   [name], MD  simvastatin (ZOCOR) 40 MG tablet Take 1 tablet (40 mg total) by mouth at bedtime. 12/17/15   Donella Stade, PA-C  VIRTUSSIN A/C 100-10 MG/5ML syrup SMARTSIG:5-10 Milliliter(s) By Mouth Every Night PRN 04/13/19   [provider]     Family History  Problem Relation Age of Onset   Lung cancer Mother    Cancer Father 88       Stomach   Heart failure Maternal Grandmother 80       CHF   Heart failure Maternal Grandfather 3       Acute MI    Social History   Socioeconomic History   Marital status: Married    Spouse name: Not on file   Number of children: Not on file   Years of  education: Not on file   Highest education level: Not on file  Occupational History   Occupation: Mudlogger of [employer]    Employer: OTHER    Comment: Full time  Tobacco Use   Smoking status: Never Smoker   Smokeless tobacco: Never Used  Substance and Sexual Activity   Alcohol use: Yes    Comment: Occasional   Drug use: No   Sexual activity: Not Currently  Other Topics Concern   Not on file  Social History Narrative   Married   Gets regular exercise   Social Determinants of Health   Financial Resource  Strain:    Difficulty of Paying Living Expenses: Not on file  Food Insecurity:    Worried About Charity fundraiser in the Last Year: Not on file   [employer] of Food in the Last Year: Not on file  Transportation Needs:    Lack of Transportation (Medical): Not on file   Lack of Transportation (Non-Medical): Not on file  Physical Activity:    Days of Exercise per Week: Not on file   Minutes of Exercise per Session: Not on file  Stress:    Feeling of Stress : Not on file  Social Connections:    Frequency of Communication with Friends and Family: Not on file   Frequency of Social Gatherings with Friends and Family: Not on file   Attends Religious Services: Not on file   Active Member of Clubs or Organizations: Not on file   Attends Archivist Meetings: Not on file   Marital Status: Not on file     Review of Systems: A 12 point ROS discussed and pertinent positives are indicated in the HPI above.  All other systems are negative.  Review of Systems  Constitutional: Negative for chills and fever.  Respiratory: Negative for shortness of breath and wheezing.   Cardiovascular: Negative for chest pain and palpitations.  Gastrointestinal: Negative for abdominal pain.  Neurological: Negative for headaches.  Psychiatric/Behavioral: Negative for behavioral problems and confusion.    Vital Signs: Ht 5\' 7"  (1.702 m)    Wt 172 lb (78 kg)    BMI 26.94 kg/m   Physical Exam Vitals and nursing note reviewed.  Constitutional:      General: She is not in acute distress.    Appearance: Normal appearance.  Cardiovascular:     Rate and Rhythm: Normal rate and regular rhythm.     Heart sounds: Normal heart sounds. No murmur.  Pulmonary:     Effort: Pulmonary effort is normal. No respiratory distress.     Breath sounds: Normal breath sounds. No wheezing.  Skin:    General: Skin is warm and dry.  Neurological:     Mental Status: She is alert and oriented to person, place,  and time.  Psychiatric:        Mood and Affect: Mood normal.        Behavior: Behavior normal.      MD Evaluation Airway: WNL Heart: WNL Abdomen: WNL Chest/ Lungs: WNL ASA  Classification: 3 Mallampati/Airway Score: Two   Imaging: CT ABDOMEN PELVIS W CONTRAST  Result Date: 04/16/2019 CLINICAL DATA:  Abdominal pain. Evaluate for appendicitis. EXAM: CT ABDOMEN AND PELVIS WITH CONTRAST TECHNIQUE: Multidetector CT imaging of the abdomen and pelvis was performed using the standard protocol following bolus administration of intravenous contrast. CONTRAST:  157mL OMNIPAQUE IOHEXOL 300 MG/ML  SOLN COMPARISON:  None. FINDINGS: Lower chest: Calcified mediastinal and hilar lymph nodes are  identified. There are small bilateral pleural effusions. Subsegmental atelectasis identified in the posterior right lower lobe. Hepatobiliary: No suspicious liver abnormality identified. Previous cholecystectomy. No intrahepatic bile duct or common bile duct dilatation. Pancreas: Unremarkable. No pancreatic ductal dilatation or surrounding inflammatory changes. Spleen: Calcified granulomas within normal size spleen. Adrenals/Urinary Tract: The adrenal glands are unremarkable. No kidney mass or hydronephrosis identified. The urinary bladder is largely obscured by beam hardening artifact from right hip arthroplasty device. Stomach/Bowel: Moderate size hiatal hernia. Stomach is nondistended. There is no small bowel distension, wall thickening or inflammation. No pathologic dilatation of the colon. The appendix is not confidently identified Vascular/Lymphatic: Mild aortic atherosclerosis. No aneurysm. No abdominopelvic adenopathy identified. Reproductive: Status post hysterectomy. Other: There is a large volume of ascites within the abdomen and pelvis which appears partially loculated. Additionally, there are scattered areas of what appears to be soft tissue nodularity. This includes a peritoneal nodule in the left lower  quadrant of the abdomen measuring 2.1 cm, image 106/7. Multiple small partially calcified peritoneal nodules are identified throughout the omentum, image 28/6. Ill-defined low-density mass within the right iliac fossa is suspected measuring approximately 10.0 x 6.8 by 6.2 cm. This contains wispy, internal areas of soft tissue attenuation. Musculoskeletal: No acute or significant osseous findings. IMPRESSION: 1. Nonvisualization of the appendix. However, there is a large volume of ascites within the abdomen and pelvis which appears partially loculated. In the acute setting findings may be the sequelae of peritonitis. However, there are multiple partially calcified peritoneal nodules throughout the omentum which are concerning for peritoneal carcinomatosis. Noncalcified nodule in the left lower quadrant peritoneal reflection is noted. Further investigation with diagnostic paracentesis is advised. 2. Ill-defined low-density mass within the right iliac fossa is suspected and may represent an ovarian neoplasm. 3. Aortic atherosclerosis. 4. Small bilateral pleural effusions. 5. Hiatal hernia. 6. Prior granulomatous disease. Aortic Atherosclerosis (ICD10-I70.0). Electronically Signed   By: Kerby Moors M.D.   On: 04/16/2019 17:25   US Paracentesis  Result Date: 04/25/2019 INDICATION: Patient with history of right iliac fossa mass, elevated CA 125, peritoneal carcinomatosis with recurrent malignant ascites; request received for therapeutic paracentesis. EXAM: ULTRASOUND GUIDED THERAPEUTIC PARACENTESIS MEDICATIONS: None COMPLICATIONS: None immediate. PROCEDURE: Informed written consent was obtained from the patient after a discussion of the risks, benefits and alternatives to treatment. A timeout was performed prior to the initiation of the procedure. Initial ultrasound scanning demonstrates a moderate amount of ascites within the right mid to lower abdominal quadrant. The right mid to lower abdomen was prepped and  draped in the usual sterile fashion. 1% lidocaine was used for local anesthesia. Following this, a 19 gauge, 10-cm, Yueh catheter was introduced. An ultrasound image was saved for documentation purposes. The paracentesis was performed. The catheter was removed and a dressing was applied. The patient tolerated the procedure well without immediate post procedural complication. FINDINGS: A total of approximately 2.5 liters of blood-tinged fluid was removed. IMPRESSION: Successful ultrasound-guided therapeutic paracentesis yielding 2.5 liters of peritoneal fluid. Read by: Rowe Robert, PA-C Electronically Signed   By: Markus Daft M.D.   On: 04/25/2019 13:25   US Paracentesis  Result Date: 04/18/2019 INDICATION: Patient with history of right iliac fossa mass, peritoneal nodules, ascites, elevated CA 125; request received for diagnostic and therapeutic paracentesis. EXAM: ULTRASOUND GUIDED DIAGNOSTIC AND THERAPEUTIC PARACENTESIS MEDICATIONS: None COMPLICATIONS: None immediate. PROCEDURE: Informed written consent was obtained from the patient after a discussion of the risks, benefits and alternatives to treatment. A timeout was performed prior to  the initiation of the procedure. Initial ultrasound scanning demonstrates a large amount of ascites within the right lower abdominal quadrant. The right lower abdomen was prepped and draped in the usual sterile fashion. 1% lidocaine was used for local anesthesia. Following this, a 19 gauge, 10-cm, Yueh catheter was introduced. An ultrasound image was saved for documentation purposes. The paracentesis was performed. The catheter was removed and a dressing was applied. The patient tolerated the procedure well without immediate post procedural complication. FINDINGS: A total of approximately 3.2 liters of blood-tinged fluid was removed. Samples were sent to the laboratory as requested by the clinical team. IMPRESSION: Successful ultrasound-guided diagnostic and therapeutic  paracentesis yielding 3.2 liters of peritoneal fluid. Read by: Rowe Robert, PA-C Electronically Signed   By: Jacqulynn Cadet M.D.   On: 04/18/2019 11:49   DG Chest Portable 1 View  Result Date: 04/16/2019 CLINICAL DATA:  Cough. EXAM: PORTABLE CHEST 1 VIEW COMPARISON:  June 25, 2013. FINDINGS: The heart size and mediastinal contours are within normal limits. Both lungs are clear. The visualized skeletal structures are unremarkable. IMPRESSION: No active disease. Electronically Signed   By: Marijo Conception M.D.   On: 04/16/2019 12:56    Labs:  CBC: Recent Labs    04/16/19 0949  WBC 12.7*  HGB 11.2*  HCT 32.9*  PLT 759*    COAGS: No results for input(s): INR, APTT in the last 8760 hours.  BMP: Recent Labs    04/16/19 0949  NA 131*  K 3.9  CL 93*  CO2 24  GLUCOSE 111*  BUN 7*  CALCIUM 9.2  CREATININE 0.97  GFRNONAA >60  GFRAA >60    LIVER FUNCTION TESTS: Recent Labs    04/16/19 0949  BILITOT 0.7  AST 41  ALT 41  ALKPHOS 184*  PROT 7.0  ALBUMIN 2.2*     Assessment and Plan:  Ovarian cancer with tentative plans to begin systemic chemotherapy. Plan for image-guided Port-a-cath placement today in IR. Patient is NPO. Afebrile. She does not take blood thinners. INR pending.  Patient states that Dr. Alvy Bimler is requesting that we leave her Port-a-cath accessed for chemotherapy tomorrow. Discussed case with Dr. Earleen Newport who does not recommend leaving Port-a-cath accessed- infection risk to send patient home with accessed [facility]. Informed patient of above. Called Dr. Calton Dach office [phone]) and spoke with Judson Roch, RN to inform on above. All questions answered and concerns addressed.  Risks and benefits of image guided port-a-catheter placement were discussed with the patient including, but not limited to bleeding, infection, pneumothorax, or fibrin sheath development and need for additional procedures. All of the patient's questions were answered, patient is  agreeable to proceed. Consent signed and in chart.   Thank you for this interesting consult.  I greatly enjoyed meeting DORINE NEELD and look forward to participating in their care.  A copy of this report was sent to the requesting provider on this date.  Electronically Signed: Earley Abide, PA-C 04/26/2019, 11:29 AM   I spent a total of 40 Minutes in face to face in clinical consultation, greater than 50% of which was counseling/coordinating care for ovarian cancer/Port-a-cath placement.

## 2019-04-26 NOTE — Telephone Encounter (Signed)
Telephone call from IR- due to patient's lab work today (elevated WBC) patient is not eligible for a port placement today. Patient will receive 1st treatment peripherally. Patient will be scheduled for port placement on 12/30 or 12/31. The IR department is scheduling this. Writer will follow up.

## 2019-04-26 NOTE — Progress Notes (Signed)
Lab results with WBC elevated.   IR aware- AlleyPA in to talk with patient and reschedule appointment in IR.

## 2019-04-26 NOTE — Progress Notes (Signed)
IR requested by Dr. Alvy Bimler for possible image-guided Port-a-cath placement.  Pre-procedural labs revealed WBCs elevated at 14.1- up from 12.7 on 04/20/2019. Discussed case with Dr. Earleen Newport who does not recommend Port-a-cath placement at this time- potential infection risk. Recommends possible PICC insertion so that patient can still get chemotherapy.  Called Dr. Calton Dach office [phone]) and spoke with Judson Roch, RN. Informed her on above, and she spoke with Dr. Alvy Bimler regarding management options. Per Dr. Alvy Bimler, do not place Port-a-cath or PICC today, patient to complete chemotherapy as scheduled tomorrow AM via peripheral IV, and patient to be rescheduled for Port-a-cath placement in IR 05/17/2019. Dr. Alvy Bimler to see patient tomorrow at chemotherapy to discuss management of leukocytosis.  Discussed above with patient. Handout with rescheduled procedure information given to patient- plan for procedure 05/17/2019 at 0800. Patient to be sent home today, RN aware to remove IVs. All questions answered and concerns addressed. Patient conveys understanding and agrees with plan.  Please call IR with questions/concerns.   Bea Graff Louk, PA-C 04/26/2019, 12:35 PM

## 2019-04-26 NOTE — Discharge Instructions (Addendum)
Implanted Port Insertion, Care After °This sheet gives you information about how to care for yourself after your procedure. Your health care provider may also give you more specific instructions. If you have problems or questions, contact your health care provider. °What can I expect after the procedure? °After the procedure, it is common to have: °· Discomfort at the port insertion site. °· Bruising on the skin over the port. This should improve over 3-4 days. °Follow these instructions at home: °Port care °· After your port is placed, you will get a manufacturer's information card. The card has information about your port. Keep this card with you at all times. °· Take care of the port as told by your health care provider. Ask your health care provider if you or a family member can get training for taking care of the port at home. A home health care nurse may also take care of the port. °· Make sure to remember what type of port you have. °Incision care ° °  ° °· Follow instructions from your health care provider about how to take care of your port insertion site. Make sure you: °? Wash your hands with soap and water before and after you change your bandage (dressing). If soap and water are not available, use hand sanitizer. °? Change your dressing as told by your health care provider. °? Leave stitches (sutures), skin glue, or adhesive strips in place. These skin closures may need to stay in place for 2 weeks or longer. If adhesive strip edges start to loosen and curl up, you may trim the loose edges. Do not remove adhesive strips completely unless your health care provider tells you to do that. °· Check your port insertion site every day for signs of infection. Check for: °? Redness, swelling, or pain. °? Fluid or blood. °? Warmth. °? Pus or a bad smell. °Activity °· Return to your normal activities as told by your health care provider. Ask your health care provider what activities are safe for you. °· Do not  lift anything that is heavier than 10 lb (4.5 kg), or the limit that you are told, until your health care provider says that it is safe. °General instructions °· Take over-the-counter and prescription medicines only as told by your health care provider. °· Do not take baths, swim, or use a hot tub until your health care provider approves. Ask your health care provider if you may take showers. You may only be allowed to take sponge baths. °· Do not drive for 24 hours if you were given a sedative during your procedure. °· Wear a medical alert bracelet in case of an emergency. This will tell any health care providers that you have a port. °· Keep all follow-up visits as told by your health care provider. This is important. °Contact a health care provider if: °· You cannot flush your port with saline as directed, or you cannot draw blood from the port. °· You have a fever or chills. °· You have redness, swelling, or pain around your port insertion site. °· You have fluid or blood coming from your port insertion site. °· Your port insertion site feels warm to the touch. °· You have pus or a bad smell coming from the port insertion site. °Get help right away if: °· You have chest pain or shortness of breath. °· You have bleeding from your port that you cannot control. °Summary °· Take care of the port as told by your health   care provider. Keep the manufacturer's information card with you at all times. °· Change your dressing as told by your health care provider. °· Contact a health care provider if you have a fever or chills or if you have redness, swelling, or pain around your port insertion site. °· Keep all follow-up visits as told by your health care provider. °This information is not intended to replace advice given to you by your health care provider. Make sure you discuss any questions you have with your health care provider. °Document Released: 02/21/2013 Document Revised: 11/29/2017 Document Reviewed:  11/29/2017 °[employer] Patient Education © 2020 [employer] ° ° ° °Implanted Port Home Guide °An implanted port is a device that is placed under the skin. It is usually placed in the chest. The device can be used to give IV medicine, to take blood, or for dialysis. You may have an implanted port if: °· You need IV medicine that would be irritating to the small veins in your hands or arms. °· You need IV medicines, such as antibiotics, for a long period of time. °· You need IV nutrition for a long period of time. °· You need dialysis. °Having a port means that your health care provider will not need to use the veins in your arms for these procedures. You may have fewer limitations when using a port than you would if you used other types of long-term IVs, and you will likely be able to return to normal activities after your incision heals. °An implanted port has two main parts: °· Reservoir. The reservoir is the part where a needle is inserted to give medicines or draw blood. The reservoir is round. After it is placed, it appears as a small, raised area under your skin. °· Catheter. The catheter is a thin, flexible tube that connects the reservoir to a vein. Medicine that is inserted into the reservoir goes into the catheter and then into the vein. °How is my port accessed? °To access your port: °· A numbing cream may be placed on the skin over the port site. °· Your health care provider will put on a mask and sterile gloves. °· The skin over your port will be cleaned carefully with a germ-killing soap and allowed to dry. °· Your health care provider will gently pinch the port and insert a needle into it. °· Your health care provider will check for a blood return to make sure the port is in the vein and is not clogged. °· If your port needs to remain accessed to get medicine continuously (constant infusion), your health care provider will place a clear bandage (dressing) over the needle site. The dressing and needle  will need to be changed every week, or as told by your health care provider. °What is flushing? °Flushing helps keep the port from getting clogged. Follow instructions from your health care provider about how and when to flush the port. Ports are usually flushed with saline solution or a medicine called heparin. The need for flushing will depend on how the port is used: °· If the port is only used from time to time to give medicines or draw blood, the port may need to be flushed: °? Before and after medicines have been given. °? Before and after blood has been drawn. °? As part of routine maintenance. Flushing may be recommended every 4-6 weeks. °· If a constant infusion is running, the port may not need to be flushed. °· Throw away any syringes in   a disposal container that is meant for sharp items (sharps container). You can buy a sharps container from a pharmacy, or you can make one by using an empty hard plastic bottle with a cover. °How long will my port stay implanted? °The port can stay in for as long as your health care provider thinks it is needed. When it is time for the port to come out, a surgery will be done to remove it. The surgery will be similar to the procedure that was done to put the port in. °Follow these instructions at home: ° °· Flush your port as told by your health care provider. °· If you need an infusion over several days, follow instructions from your health care provider about how to take care of your port site. Make sure you: °? Wash your hands with soap and water before you change your dressing. If soap and water are not available, use alcohol-based hand sanitizer. °? Change your dressing as told by your health care provider. °? Place any used dressings or infusion bags into a plastic bag. Throw that bag in the trash. °? Keep the dressing that covers the needle clean and dry. Do not get it wet. °? Do not use scissors or sharp objects near the tube. °? Keep the tube clamped, unless it  is being used. °· Check your port site every day for signs of infection. Check for: °? Redness, swelling, or pain. °? Fluid or blood. °? Pus or a bad smell. °· Protect the skin around the port site. °? Avoid wearing bra straps that rub or irritate the site. °? Protect the skin around your port from seat belts. Place a soft pad over your chest if needed. °· Bathe or shower as told by your health care provider. The site may get wet as long as you are not actively receiving an infusion. °· Return to your normal activities as told by your health care provider. Ask your health care provider what activities are safe for you. °· Carry a medical alert card or wear a medical alert bracelet at all times. This will let health care providers know that you have an implanted port in case of an emergency. °Get help right away if: °· You have redness, swelling, or pain at the port site. °· You have fluid or blood coming from your port site. °· You have pus or a bad smell coming from the port site. °· You have a fever. °Summary °· Implanted ports are usually placed in the chest for long-term IV access. °· Follow instructions from your health care provider about flushing the port and changing bandages (dressings). °· Take care of the area around your port by avoiding clothing that puts pressure on the area, and by watching for signs of infection. °· Protect the skin around your port from seat belts. Place a soft pad over your chest if needed. °· Get help right away if you have a fever or you have redness, swelling, pain, drainage, or a bad smell at the port site. °This information is not intended to replace advice given to you by your health care provider. Make sure you discuss any questions you have with your health care provider. °Document Released: 05/03/2005 Document Revised: 08/25/2018 Document Reviewed: 06/05/2016 °[employer]vier Patient Education © 2020 [employer] ° ° °Moderate Conscious Sedation, Adult, Care After °These  instructions provide you with information about caring for yourself after your procedure. Your health care provider may also give you more specific instructions.   Your treatment has been planned according to current medical practices, but problems sometimes occur. Call your health care provider if you have any problems or questions after your procedure. °What can I expect after the procedure? °After your procedure, it is common: °· To feel sleepy for several hours. °· To feel clumsy and have poor balance for several hours. °· To have poor judgment for several hours. °· To vomit if you eat too soon. °Follow these instructions at home: °For at least 24 hours after the procedure: ° °· Do not: °? Participate in activities where you could fall or become injured. °? Drive. °? Use heavy machinery. °? Drink alcohol. °? Take sleeping pills or medicines that cause drowsiness. °? Make important decisions or sign legal documents. °? Take care of children on your own. °· Rest. °Eating and drinking °· Follow the diet recommended by your health care provider. °· If you vomit: °? Drink water, juice, or soup when you can drink without vomiting. °? Make sure you have little or no nausea before eating solid foods. °General instructions °· Have a responsible adult stay with you until you are awake and alert. °· Take over-the-counter and prescription medicines only as told by your health care provider. °· If you smoke, do not smoke without supervision. °· Keep all follow-up visits as told by your health care provider. This is important. °Contact a health care provider if: °· You keep feeling nauseous or you keep vomiting. °· You feel light-headed. °· You develop a rash. °· You have a fever. °Get help right away if: °· You have trouble breathing. °This information is not intended to replace advice given to you by your health care provider. Make sure you discuss any questions you have with your health care provider. °Document Released:  02/21/2013 Document Revised: 04/15/2017 Document Reviewed: 08/23/2015 °[employer] Patient Education © 2020 [employer] ° °

## 2019-04-27 ENCOUNTER — Other Ambulatory Visit: Payer: Self-pay

## 2019-04-27 ENCOUNTER — Inpatient Hospital Stay: Payer: Medicare HMO

## 2019-04-27 ENCOUNTER — Encounter: Payer: Self-pay | Admitting: Oncology

## 2019-04-27 VITALS — BP 145/91 | HR 73 | Temp 98.2°F | Resp 16

## 2019-04-27 DIAGNOSIS — C786 Secondary malignant neoplasm of retroperitoneum and peritoneum: Secondary | ICD-10-CM

## 2019-04-27 DIAGNOSIS — Z5111 Encounter for antineoplastic chemotherapy: Secondary | ICD-10-CM | POA: Diagnosis not present

## 2019-04-27 MED ORDER — SODIUM CHLORIDE 0.9 % IV SOLN
Freq: Once | INTRAVENOUS | Status: AC
  Administered 2019-04-27: 08:00:00 via INTRAVENOUS
  Filled 2019-04-27: qty 250

## 2019-04-27 MED ORDER — SODIUM CHLORIDE 0.9 % IV SOLN
579.0000 mg | Freq: Once | INTRAVENOUS | Status: AC
  Administered 2019-04-27: 580 mg via INTRAVENOUS
  Filled 2019-04-27: qty 58

## 2019-04-27 MED ORDER — SODIUM CHLORIDE 0.9 % IV SOLN
175.0000 mg/m2 | Freq: Once | INTRAVENOUS | Status: AC
  Administered 2019-04-27: 348 mg via INTRAVENOUS
  Filled 2019-04-27: qty 58

## 2019-04-27 MED ORDER — PALONOSETRON HCL INJECTION 0.25 MG/5ML
INTRAVENOUS | Status: AC
  Filled 2019-04-27: qty 5

## 2019-04-27 MED ORDER — DIPHENHYDRAMINE HCL 50 MG/ML IJ SOLN
50.0000 mg | Freq: Once | INTRAMUSCULAR | Status: AC
  Administered 2019-04-27: 50 mg via INTRAVENOUS

## 2019-04-27 MED ORDER — DEXAMETHASONE SODIUM PHOSPHATE 10 MG/ML IJ SOLN
INTRAMUSCULAR | Status: AC
  Filled 2019-04-27: qty 1

## 2019-04-27 MED ORDER — DIPHENHYDRAMINE HCL 50 MG/ML IJ SOLN
INTRAMUSCULAR | Status: AC
  Filled 2019-04-27: qty 1

## 2019-04-27 MED ORDER — FAMOTIDINE IN NACL 20-0.9 MG/50ML-% IV SOLN
20.0000 mg | Freq: Once | INTRAVENOUS | Status: AC
  Administered 2019-04-27: 20 mg via INTRAVENOUS

## 2019-04-27 MED ORDER — PALONOSETRON HCL INJECTION 0.25 MG/5ML
0.2500 mg | Freq: Once | INTRAVENOUS | Status: AC
  Administered 2019-04-27: 0.25 mg via INTRAVENOUS

## 2019-04-27 MED ORDER — SODIUM CHLORIDE 0.9 % IV SOLN: 10.0000 mg | Freq: Once | INTRAVENOUS | Status: DC

## 2019-04-27 MED ORDER — FAMOTIDINE IN NACL 20-0.9 MG/50ML-% IV SOLN
INTRAVENOUS | Status: AC
  Filled 2019-04-27: qty 50

## 2019-04-27 MED ORDER — DEXAMETHASONE SODIUM PHOSPHATE 10 MG/ML IJ SOLN
10.0000 mg | Freq: Once | INTRAMUSCULAR | Status: AC
  Administered 2019-04-27: 10 mg via INTRAVENOUS

## 2019-04-27 MED ORDER — SODIUM CHLORIDE 0.9 % IV SOLN
150.0000 mg | Freq: Once | INTRAVENOUS | Status: AC
  Administered 2019-04-27: 150 mg via INTRAVENOUS
  Filled 2019-04-27: qty 5

## 2019-04-27 NOTE — Patient Instructions (Signed)
[facility] Discharge Instructions for Patients Receiving Chemotherapy  Today you received the following chemotherapy agents Taxol and Carboplatin   To help prevent nausea and vomiting after your treatment, we encourage you to take your nausea medication as directed. No Zofran for 3 days. Take Compazine instead.    If you develop nausea and vomiting that is not controlled by your nausea medication, call the clinic.   BELOW ARE SYMPTOMS THAT SHOULD BE REPORTED IMMEDIATELY:  *FEVER GREATER THAN 100.5 F  *CHILLS WITH OR WITHOUT FEVER  NAUSEA AND VOMITING THAT IS NOT CONTROLLED WITH YOUR NAUSEA MEDICATION  *UNUSUAL SHORTNESS OF BREATH  *UNUSUAL BRUISING OR BLEEDING  TENDERNESS IN MOUTH AND THROAT WITH OR WITHOUT PRESENCE OF ULCERS  *URINARY PROBLEMS  *BOWEL PROBLEMS  UNUSUAL RASH Items with * indicate a potential emergency and should be followed up as soon as possible.  Feel free to call the clinic should you have any questions or concerns. The clinic phone number is [phone].  Please show the [facility] at check-in to the Emergency Department and triage nurse.  Paclitaxel injection What is this medicine? PACLITAXEL (PAK li TAX el) is a chemotherapy drug. It targets fast dividing cells, like cancer cells, and causes these cells to die. This medicine is used to treat ovarian cancer, breast cancer, lung cancer, Kaposi's sarcoma, and other cancers. This medicine may be used for other purposes; ask your health care provider or pharmacist if you have questions. COMMON BRAND NAME(S): Onxol, Taxol What should I tell my health care provider before I take this medicine? They need to know if you have any of these conditions:  history of irregular heartbeat  liver disease  low blood counts, like low white cell, platelet, or red cell counts  lung or breathing disease, like asthma  tingling of the fingers or toes, or other nerve disorder  an unusual or  allergic reaction to paclitaxel, alcohol, polyoxyethylated castor oil, other chemotherapy, other medicines, foods, dyes, or preservatives  pregnant or trying to get pregnant  breast-feeding How should I use this medicine? This drug is given as an infusion into a vein. It is administered in a hospital or clinic by a specially trained health care professional. Talk to your pediatrician regarding the use of this medicine in children. Special care may be needed. Overdosage: If you think you have taken too much of this medicine contact a poison control center or emergency room at once. NOTE: This medicine is only for you. Do not share this medicine with others. What if I miss a dose? It is important not to miss your dose. Call your doctor or health care professional if you are unable to keep an appointment. What may interact with this medicine? Do not take this medicine with any of the following medications:  disulfiram  metronidazole This medicine may also interact with the following medications:  antiviral medicines for hepatitis, HIV or AIDS  certain antibiotics like erythromycin and clarithromycin  certain medicines for fungal infections like ketoconazole and itraconazole  certain medicines for seizures like carbamazepine, phenobarbital, phenytoin  gemfibrozil  nefazodone  rifampin  St. John's wort This list may not describe all possible interactions. Give your health care provider a list of all the medicines, herbs, non-prescription drugs, or dietary supplements you use. Also tell them if you smoke, drink alcohol, or use illegal drugs. Some items may interact with your medicine. What should I watch for while using this medicine? Your condition will be monitored carefully  while you are receiving this medicine. You will need important blood work done while you are taking this medicine. This medicine can cause serious allergic reactions. To reduce your risk you will need to take  other medicine(s) before treatment with this medicine. If you experience allergic reactions like skin rash, itching or hives, swelling of the face, lips, or tongue, tell your doctor or health care professional right away. In some cases, you may be given additional medicines to help with side effects. Follow all directions for their use. This drug may make you feel generally unwell. This is not uncommon, as chemotherapy can affect healthy cells as well as cancer cells. Report any side effects. Continue your course of treatment even though you feel ill unless your doctor tells you to stop. Call your doctor or health care professional for advice if you get a fever, chills or sore throat, or other symptoms of a cold or flu. Do not treat yourself. This drug decreases your body's ability to fight infections. Try to avoid being around people who are sick. This medicine may increase your risk to bruise or bleed. Call your doctor or health care professional if you notice any unusual bleeding. Be careful brushing and flossing your teeth or using a toothpick because you may get an infection or bleed more easily. If you have any dental work done, tell your dentist you are receiving this medicine. Avoid taking products that contain aspirin, acetaminophen, ibuprofen, naproxen, or ketoprofen unless instructed by your doctor. These medicines may hide a fever. Do not become pregnant while taking this medicine. Women should inform their doctor if they wish to become pregnant or think they might be pregnant. There is a potential for serious side effects to an unborn child. Talk to your health care professional or pharmacist for more information. Do not breast-feed an infant while taking this medicine. Men are advised not to father a child while receiving this medicine. This product may contain alcohol. Ask your pharmacist or healthcare provider if this medicine contains alcohol. Be sure to tell all healthcare providers you  are taking this medicine. Certain medicines, like metronidazole and disulfiram, can cause an unpleasant reaction when taken with alcohol. The reaction includes flushing, headache, nausea, vomiting, sweating, and increased thirst. The reaction can last from 30 minutes to several hours. What side effects may I notice from receiving this medicine? Side effects that you should report to your doctor or health care professional as soon as possible:  allergic reactions like skin rash, itching or hives, swelling of the face, lips, or tongue  breathing problems  changes in vision  fast, irregular heartbeat  high or low blood pressure  mouth sores  pain, tingling, numbness in the hands or feet  signs of decreased platelets or bleeding - bruising, pinpoint red spots on the skin, black, tarry stools, blood in the urine  signs of decreased red blood cells - unusually weak or tired, feeling faint or lightheaded, falls  signs of infection - fever or chills, cough, sore throat, pain or difficulty passing urine  signs and symptoms of liver injury like dark yellow or brown urine; general ill feeling or flu-like symptoms; light-colored stools; loss of appetite; nausea; right upper belly pain; unusually weak or tired; yellowing of the eyes or skin  swelling of the ankles, feet, hands  unusually slow heartbeat Side effects that usually do not require medical attention (report to your doctor or health care professional if they continue or are bothersome):  diarrhea  hair loss  loss of appetite  muscle or joint pain  nausea, vomiting  pain, redness, or irritation at site where injected  tiredness This list may not describe all possible side effects. Call your doctor for medical advice about side effects. You may report side effects to FDA at 1-800-FDA-1088. Where should I keep my medicine? This drug is given in a hospital or clinic and will not be stored at home. NOTE: This sheet is a  summary. It may not cover all possible information. If you have questions about this medicine, talk to your doctor, pharmacist, or health care provider.  2020 Elsevier/Gold Standard (2017-01-04 13:14:55)  Carboplatin injection What is this medicine? CARBOPLATIN (KAR boe pla tin) is a chemotherapy drug. It targets fast dividing cells, like cancer cells, and causes these cells to die. This medicine is used to treat ovarian cancer and many other cancers. This medicine may be used for other purposes; ask your health care provider or pharmacist if you have questions. COMMON BRAND NAME(S): Paraplatin What should I tell my health care provider before I take this medicine? They need to know if you have any of these conditions:  blood disorders  hearing problems  kidney disease  recent or ongoing radiation therapy  an unusual or allergic reaction to carboplatin, cisplatin, other chemotherapy, other medicines, foods, dyes, or preservatives  pregnant or trying to get pregnant  breast-feeding How should I use this medicine? This drug is usually given as an infusion into a vein. It is administered in a hospital or clinic by a specially trained health care professional. Talk to your pediatrician regarding the use of this medicine in children. Special care may be needed. Overdosage: If you think you have taken too much of this medicine contact a poison control center or emergency room at once. NOTE: This medicine is only for you. Do not share this medicine with others. What if I miss a dose? It is important not to miss a dose. Call your doctor or health care professional if you are unable to keep an appointment. What may interact with this medicine?  medicines for seizures  medicines to increase blood counts like filgrastim, pegfilgrastim, sargramostim  some antibiotics like amikacin, gentamicin, neomycin, streptomycin, tobramycin  vaccines Talk to your doctor or health care professional  before taking any of these medicines:  acetaminophen  aspirin  ibuprofen  ketoprofen  naproxen This list may not describe all possible interactions. Give your health care provider a list of all the medicines, herbs, non-prescription drugs, or dietary supplements you use. Also tell them if you smoke, drink alcohol, or use illegal drugs. Some items may interact with your medicine. What should I watch for while using this medicine? Your condition will be monitored carefully while you are receiving this medicine. You will need important blood work done while you are taking this medicine. This drug may make you feel generally unwell. This is not uncommon, as chemotherapy can affect healthy cells as well as cancer cells. Report any side effects. Continue your course of treatment even though you feel ill unless your doctor tells you to stop. In some cases, you may be given additional medicines to help with side effects. Follow all directions for their use. Call your doctor or health care professional for advice if you get a fever, chills or sore throat, or other symptoms of a cold or flu. Do not treat yourself. This drug decreases your body's ability to fight infections. Try to avoid being around people who  are sick. This medicine may increase your risk to bruise or bleed. Call your doctor or health care professional if you notice any unusual bleeding. Be careful brushing and flossing your teeth or using a toothpick because you may get an infection or bleed more easily. If you have any dental work done, tell your dentist you are receiving this medicine. Avoid taking products that contain aspirin, acetaminophen, ibuprofen, naproxen, or ketoprofen unless instructed by your doctor. These medicines may hide a fever. Do not become pregnant while taking this medicine. Women should inform their doctor if they wish to become pregnant or think they might be pregnant. There is a potential for serious side effects  to an unborn child. Talk to your health care professional or pharmacist for more information. Do not breast-feed an infant while taking this medicine. What side effects may I notice from receiving this medicine? Side effects that you should report to your doctor or health care professional as soon as possible:  allergic reactions like skin rash, itching or hives, swelling of the face, lips, or tongue  signs of infection - fever or chills, cough, sore throat, pain or difficulty passing urine  signs of decreased platelets or bleeding - bruising, pinpoint red spots on the skin, black, tarry stools, nosebleeds  signs of decreased red blood cells - unusually weak or tired, fainting spells, lightheadedness  breathing problems  changes in hearing  changes in vision  chest pain  high blood pressure  low blood counts - This drug may decrease the number of white blood cells, red blood cells and platelets. You may be at increased risk for infections and bleeding.  nausea and vomiting  pain, swelling, redness or irritation at the injection site  pain, tingling, numbness in the hands or feet  problems with balance, talking, walking  trouble passing urine or change in the amount of urine Side effects that usually do not require medical attention (report to your doctor or health care professional if they continue or are bothersome):  hair loss  loss of appetite  metallic taste in the mouth or changes in taste This list may not describe all possible side effects. Call your doctor for medical advice about side effects. You may report side effects to FDA at 1-800-FDA-1088. Where should I keep my medicine? This drug is given in a hospital or clinic and will not be stored at home. NOTE: This sheet is a summary. It may not cover all possible information. If you have questions about this medicine, talk to your doctor, pharmacist, or health care provider.  2020 Elsevier/Gold Standard  (2007-08-08 14:38:05)

## 2019-04-27 NOTE — Progress Notes (Signed)
Met with Connie Heath in the infusion room for her 1st cycle of carboplatin/Taxol.  Discussed what to expect after chemotherapy and encouraged her to call with any questions.

## 2019-04-30 ENCOUNTER — Telehealth: Payer: Self-pay | Admitting: *Deleted

## 2019-04-30 ENCOUNTER — Other Ambulatory Visit: Payer: Self-pay | Admitting: Oncology

## 2019-04-30 NOTE — Telephone Encounter (Signed)
-----   Message from Veverly Fells, RN sent at 04/27/2019  2:58 PM EST ----- Regarding: GORSUCH: First-time follow-up Patient had 1st time Taxol/Carboplatin today. Tolerated well with no complaints.

## 2019-04-30 NOTE — Progress Notes (Signed)
Gynecologic Oncology Multi-Disciplinary Disposition Conference Note  Date of the Conference: 04/30/2019  Patient Name: Connie Heath  Primary GYN Oncologist: Dr. Denman George  Stage/Disposition:  Stage IIIC ovarian cancer. Disposition is to 3 cycles of neoadjuvant chemotherapy with carboplatin and Taxol followed by imaging and interval debulking surgery followed by an addition 3 cycles of chemotherapy. Genetic counseling referral.   This Multidisciplinary conference took place involving physicians from Gynecologic Oncology, Medical Oncology, Radiation Oncology, Pathology, Radiology along with the Gynecologic Oncology Nurse Practitioner and RN.  Comprehensive assessment of the patient's malignancy, staging, need for surgery, chemotherapy, radiation therapy, and need for further testing were reviewed. Supportive measures, both inpatient and following discharge were also discussed. The recommended plan of care is documented. Greater than 35 minutes were spent correlating and coordinating this patient's care.

## 2019-04-30 NOTE — Telephone Encounter (Signed)
Called pt to check on how she did with with her treatment last week.  She reports doing fair & had a good weekend.  She is trying to balance her bowels & has had some constipation & when she works on it then tends to be too loose.  She is taking miralax daily with apple juice.  She has also had to supplement with senna.  She is trying to be careful with her pain meds since she knows this can contribute to the constipation.  She took pain pill yest.  She had concerns about how to refill pain meds when needed & this was discussed to give 24-48 hours notice to Dr Alvy Bimler RN.  We also discussed trying tylenol to see if this helps in stead of oxycodone.  She also states she had some indigestion yest & wanted to know if OK to take pepcid.  Informed that this should be OK.  She denies any other problems & states she knows how to reach [place] if needed.

## 2019-05-01 ENCOUNTER — Telehealth: Payer: Self-pay | Admitting: Oncology

## 2019-05-01 ENCOUNTER — Other Ambulatory Visit: Payer: Self-pay | Admitting: Hematology and Oncology

## 2019-05-01 ENCOUNTER — Telehealth: Payer: Self-pay

## 2019-05-01 DIAGNOSIS — C786 Secondary malignant neoplasm of retroperitoneum and peritoneum: Secondary | ICD-10-CM

## 2019-05-01 DIAGNOSIS — K219 Gastro-esophageal reflux disease without esophagitis: Secondary | ICD-10-CM | POA: Insufficient documentation

## 2019-05-01 MED ORDER — ESOMEPRAZOLE MAGNESIUM 40 MG PO CPDR: DELAYED_RELEASE_CAPSULE | ORAL | 3 refills | Status: DC

## 2019-05-01 NOTE — Telephone Encounter (Signed)
Called and given below message. She verbalized understanding. 

## 2019-05-01 NOTE — Telephone Encounter (Signed)
Called Connie Heath and advised her that Dr. Denman George is recommending genetic counseling.  Discussed the reason for genetic counseling and what to expect at the appointment. Scheduled appointment for 05/07/19 at 11 am.

## 2019-05-01 NOTE — Telephone Encounter (Signed)
I can send nexium/generic equivalent 40 mg daily (take 30 mins before meal) and she can take OTC Zantac or pepcid at night

## 2019-05-01 NOTE — Telephone Encounter (Signed)
She called and left a message to call her.  Called back. She is complaining of horrible acid reflux that is getting worse. She slept with bed wedge last night. She is taking Tums and it is not helping. In the past she had this problem and she took Nexium for 2 weeks.

## 2019-05-03 ENCOUNTER — Telehealth: Payer: Self-pay | Admitting: Oncology

## 2019-05-03 NOTE — Telephone Encounter (Signed)
Connie Heath called and said she is starting to feel better after chemo but has noticed that her bp has been low.  It was 102/67 in the morning and 120/78 in the afternoon.  She said she feels "swimmy headed" when she first stands up and also when having a bowel movement.  She is drinking a lot of fluids and denies having nausea/vomiting or diarrhea.  She just wants to make sure this is normal after chemotherapy.

## 2019-05-04 NOTE — Telephone Encounter (Signed)
Called Connie Heath and advised her of message from Dr. Alvy Bimler.  She verbalized understanding.

## 2019-05-04 NOTE — Telephone Encounter (Signed)
It can be if she has lost some weight

## 2019-05-07 ENCOUNTER — Inpatient Hospital Stay: Payer: Medicare HMO | Admitting: Licensed Clinical Social Worker

## 2019-05-07 ENCOUNTER — Encounter: Payer: Self-pay | Admitting: Licensed Clinical Social Worker

## 2019-05-07 DIAGNOSIS — Z800 Family history of malignant neoplasm of digestive organs: Secondary | ICD-10-CM | POA: Insufficient documentation

## 2019-05-07 DIAGNOSIS — C786 Secondary malignant neoplasm of retroperitoneum and peritoneum: Secondary | ICD-10-CM

## 2019-05-07 DIAGNOSIS — Z801 Family history of malignant neoplasm of trachea, bronchus and lung: Secondary | ICD-10-CM

## 2019-05-07 NOTE — Progress Notes (Signed)
REFERRING PROVIDER: Dorothyann Gibbs, NP [place],   [id number]  PRIMARY PROVIDER:  Larene Beach, MD  PRIMARY REASON FOR VISIT:  1. Peritoneal carcinomatosis ([place])   2. Family history of stomach cancer   3. Family history of lung cancer      HISTORY OF PRESENT ILLNESS:   Ms. Cashin, a 68 y.o. female, was seen for a [facility] cancer genetics consultation at the request of [name], NP due to a personal and family history of cancer.  Ms. Fajardo presents to clinic today to discuss the possibility of a hereditary predisposition to cancer, genetic testing, and to further clarify her future cancer risks, as well as potential cancer risks for family members.   In 2020, at the age of 28, Ms. Schmuck was diagnosed with peritoneal carcinoma. The current treatment includes chemotherapy followed by surgery, and then chemotherapy.  CANCER HISTORY:  Oncology History  Peritoneal carcinomatosis ([place])  01/16/2019 Initial Diagnosis   She had vague symptom of dyspareunia. Presented to ER for evaluation in November for abdominal pain and vague symptom of fullness and imaging showed abnormalities   04/16/2019 Imaging   1. Nonvisualization of the appendix. However, there is a large volume of ascites within the abdomen and pelvis which appears partially loculated. In the acute setting findings may be the sequelae of peritonitis. However, there are multiple partially calcified peritoneal nodules throughout the omentum which are concerning for peritoneal carcinomatosis. Noncalcified nodule in the left lower quadrant peritoneal reflection is noted. Further investigation with diagnostic paracentesis is advised. 2. Ill-defined low-density mass within the right iliac fossa is suspected and may represent an ovarian neoplasm. 3. Aortic atherosclerosis. 4. Small bilateral pleural effusions. 5. Hiatal hernia. 6. Prior granulomatous disease.   Aortic Atherosclerosis (ICD10-I70.0).   04/17/2019  Tumor Marker   Patient's tumor was tested for the following markers: CA-125 Results of the tumor marker test revealed 288.   04/18/2019 Procedure   Successful ultrasound-guided diagnostic and therapeutic paracentesis yielding 3.2 liters of peritoneal fluid.   04/18/2019 Pathology Results   FINAL MICROSCOPIC DIAGNOSIS:  - Malignant cells present  - See comment   SPECIMEN ADEQUACY:  Satisfactory for evaluation   DIAGNOSTIC COMMENTS:  The cell block has scattered malignant cells.  These atypical cells are positive for cytokeraitn 7, MOC-31, Pax8, and WT-1 but negative for TTF-1 and cytokeratin 20.  Overall, the features are consistent with a primary gynecologic neoplasm   04/20/2019 Cancer Staging   Staging form: Ovary, Fallopian Tube, and Primary Peritoneal Carcinoma, AJCC 8th Edition - Clinical stage from 04/20/2019: FIGO Stage IIIC (cT3c, cN0, cM0) - Signed by [name], MD on 04/20/2019   04/25/2019 Procedure   Successful ultrasound-guided therapeutic paracentesis yielding 2.5 liters of peritoneal fluid.        RISK FACTORS:  Menarche was at age 55.  First live birth at age no children.  OCP use for approximately 0 years.  Ovaries intact: yes.  Hysterectomy: yes.  Menopausal status: postmenopausal.  HRT use: 0 years. Colonoscopy: yes; normal. Mammogram within the last year: yes. Number of breast biopsies: 1. Up to date with pelvic exams: yes.   Past Medical History:  Diagnosis Date  . Depression   . Family history of lung cancer   . Family history of stomach cancer   . Fatigue   . GERD (gastroesophageal reflux disease)   . Hyperlipidemia    H/o myalgias with Vytorin but tolerating simvastatin  . Hypertension   . Myalgia  Hx of   . S/P abdominal paracentesis 04/25/2019   pulled off 2.5 liters  . S/P cholecystectomy   . S/P hysterectomy   . Shingles     Past Surgical History:  Procedure Laterality Date  . LAPAROSCOPIC CHOLECYSTECTOMY    . PARTIAL  HYSTERECTOMY    . TONSILLECTOMY AND ADENOIDECTOMY      Social History   Socioeconomic History  . Marital status: Married    Spouse name: Not on file  . Number of children: Not on file  . Years of education: Not on file  . Highest education level: Not on file  Occupational History  . 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 Smoking status: Never Smoker  . Smokeless tobacco: Never Used  Substance and Sexual Activity  . Alcohol use: Yes    Comment: Occasional  . Drug use: No  . Sexual activity: Not Currently  Other Topics Concern  . Not on file  Social History Narrative   Married   Gets regular exercise   Social Determinants of Health   Financial Resource Strain:   . Difficulty of Paying Living Expenses: Not on file  Food Insecurity:   . Worried About Charity fundraiser in the Last Year: Not on file  . Ran Out of Food in the Last Year: Not on file  Transportation Needs:   . Lack of Transportation (Medical): Not on file  . Lack of Transportation (Non-Medical): Not on file  Physical Activity:   . Days of Exercise per Week: Not on file  . Minutes of Exercise per Session: Not on file  Stress:   . Feeling of Stress : Not on file  Social Connections:   . Frequency of Communication with Friends and Family: Not on file  . Frequency of Social Gatherings with Friends and Family: Not on file  . Attends Religious Services: Not on file  . Active Member of Clubs or Organizations: Not on file  . Attends Archivist Meetings: Not on file  . Marital Status: Not on file     FAMILY HISTORY:  We obtained a detailed, 4-generation family history.  Significant diagnoses are listed below: Family History  Problem Relation Age of Onset  . Lung cancer Mother   . Cancer Father 22       Stomach  . Heart failure Maternal Grandmother 80       CHF  . Heart failure Maternal Grandfather 50       Acute MI  .  Cancer Paternal Aunt        either colon or liver   Ms. Fulcher does not have children. She does not have siblings.  Ms. Mcgonagle mother died of lung cancer at age 34 and had history of smoking. Patient's mother did have one brother but he died in infancy due to a heart condition, therefore patient does not have maternal first cousins. Maternal grandmother died at 43, grandfather died around age 1, no cancers.  Ms. Huckeba father was diagnosed with gastric cancer at 63 and died at 63. Patient had 2 paternal aunts. One aunt had either colon or liver cancer and died in her 38s. The other aunt did not have cancer and died in her 63s. No known cancers in paternal cousins. Paternal grandmother died in her 73s, grandfather died in his late 43s due to stroke.  Ms. Castillo is unaware of previous family history of  genetic testing for hereditary cancer risks. Patient's maternal ancestors are of [place], [place], [place] descent, and paternal ancestors are of Furniture conservator/restorer. European descent. There is no reported Ashkenazi Jewish ancestry. There is no known consanguinity.  GENETIC COUNSELING ASSESSMENT: Ms. Reyburn is a 68 y.o. female with a personal and family history of cancer which is somewhat suggestive of a hereditary cancer syndrome and predisposition to cancer. We, therefore, discussed and recommended the following at today's visit.   DISCUSSION: We discussed that 15 - 25% of ovarian cancer/perotineal carcinoma is hereditary, with most cases associated with BRCA1/BRCA2 mutations.  There are other genes that can be associated with hereditary ovarian cancer syndromes.  These include the Lynch syndrome genes, which we discussed based on her family history.   We discussed that testing is beneficial for several reasons including knowing how to follow individuals after completing their treatment, knowing if an individual can benefit from targeted therapy and understand if other family members could be at risk for cancer  and allow them to undergo genetic testing.   We reviewed the characteristics, features and inheritance patterns of hereditary cancer syndromes. We also discussed genetic testing, including the appropriate family members to test, the process of testing, insurance coverage and turn-around-time for results. We discussed the implications of a negative, positive and/or variant of uncertain significant result. We recommended Ms. Hossain pursue genetic testing for the [facility] TumorNext-HRD+CancerNext gene panel.   The CancerNext+RNAinsight gene panel offered by [facility] includes sequencing and rearrangement analysis for the following 36 genes: APC*, ATM*, AXIN2, BARD1, BMPR1A, BRCA1*, BRCA2*, BRIP1*, CDH1*, CDK4, CDKN2A, CHEK2*, DICER1, MLH1*, MSH2*, MSH3, MSH6*, MUTYH*, NBN, NF1*, NTHL1, PALB2*, PMS2*, PTEN*, RAD51C*, RAD51D*, RECQL, SMAD4, SMARCA4, STK11 and TP53* (sequencing and deletion/duplication); HOXB13, POLD1 and POLE (sequencing only); EPCAM and GREM1 (deletion/duplication only). DNA and RNA analyses performed for * genes.   We discussed that genetic testing through [facility] will test for hereditary mutations that could explain her diagnosis of cancer. However, homologous recombination testing (HRD) is genetic testing performed on her tumor that can determine genetic changes that could influence her management. HRD testing is performed in tandem with genetic testing, and typically at no additional cost.    Somatic genes analyzed through TumorNext-HRD: ATM, BARD1, BRCA1, BRCA2, BRIP1, CHEK2, MRE11A, NBN, PALB2, RAD51C, RAD51D.  Based on Ms. Swango's personal and family history of cancer, she meets medical criteria for genetic testing. Despite that she meets criteria, she may still have an out of pocket cost. We discussed that if her out of pocket cost for testing is over $100, the laboratory will call and confirm whether she wants to proceed with testing.  If the out of pocket cost of testing is  less than $100 she will be billed by the genetic testing laboratory.   PLAN: After considering the risks, benefits, and limitations, Ms. Persinger provided informed consent to pursue genetic testing. Since we will be doing somatic testing, we will wait to perform testing until she has had surgery. Once she has had surgery and a blood draw, results should be available within approximately 6-8 weeks' time, at which point they will be disclosed by telephone to Ms. Blanchette, as will any additional recommendations warranted by these results. Ms. Sens will receive a summary of her genetic counseling visit and a copy of her results once available. This information will also be available in Epic.   Ms. Graybeal questions were answered to her satisfaction today. Our contact information was provided should additional questions or concerns arise. Thank  you for the referral and allowing [place] to share in the care of your patient.   Faith Rogue, MS, [facility] Genetic Counselor [email or web address] Phone: [phone]  The patient was seen for a total of 35 minutes in virtual genetic counseling.  Drs. Magrinat, Lindi Adie and/or Burr Medico were available for discussion regarding this case.   _______________________________________________________________________ For Office Staff:  Number of people involved in session: 1 Was an Intern/ student involved with case: no

## 2019-05-16 ENCOUNTER — Other Ambulatory Visit: Payer: Self-pay | Admitting: Radiology

## 2019-05-16 DIAGNOSIS — C786 Secondary malignant neoplasm of retroperitoneum and peritoneum: Secondary | ICD-10-CM

## 2019-05-17 ENCOUNTER — Other Ambulatory Visit: Payer: Self-pay

## 2019-05-17 ENCOUNTER — Ambulatory Visit (HOSPITAL_COMMUNITY)
Admission: RE | Admit: 2019-05-17 | Discharge: 2019-05-17 | Disposition: A | Discharge status: Home / Self Care | Payer: Medicare HMO | Source: Ambulatory Visit | Attending: Hematology and Oncology | Admitting: Hematology and Oncology

## 2019-05-17 ENCOUNTER — Other Ambulatory Visit: Payer: Self-pay | Admitting: Hematology and Oncology

## 2019-05-17 ENCOUNTER — Encounter (HOSPITAL_COMMUNITY): Payer: Self-pay

## 2019-05-17 DIAGNOSIS — Z7982 Long term (current) use of aspirin: Secondary | ICD-10-CM | POA: Insufficient documentation

## 2019-05-17 DIAGNOSIS — Z800 Family history of malignant neoplasm of digestive organs: Secondary | ICD-10-CM | POA: Insufficient documentation

## 2019-05-17 DIAGNOSIS — Z809 Family history of malignant neoplasm, unspecified: Secondary | ICD-10-CM | POA: Diagnosis not present

## 2019-05-17 DIAGNOSIS — Z96641 Presence of right artificial hip joint: Secondary | ICD-10-CM | POA: Diagnosis not present

## 2019-05-17 DIAGNOSIS — C786 Secondary malignant neoplasm of retroperitoneum and peritoneum: Secondary | ICD-10-CM | POA: Diagnosis not present

## 2019-05-17 DIAGNOSIS — C801 Malignant (primary) neoplasm, unspecified: Secondary | ICD-10-CM | POA: Insufficient documentation

## 2019-05-17 DIAGNOSIS — E785 Hyperlipidemia, unspecified: Secondary | ICD-10-CM | POA: Diagnosis not present

## 2019-05-17 DIAGNOSIS — Z91041 Radiographic dye allergy status: Secondary | ICD-10-CM | POA: Diagnosis not present

## 2019-05-17 DIAGNOSIS — I10 Essential (primary) hypertension: Secondary | ICD-10-CM | POA: Diagnosis not present

## 2019-05-17 DIAGNOSIS — K219 Gastro-esophageal reflux disease without esophagitis: Secondary | ICD-10-CM | POA: Diagnosis not present

## 2019-05-17 DIAGNOSIS — Z885 Allergy status to narcotic agent status: Secondary | ICD-10-CM | POA: Insufficient documentation

## 2019-05-17 DIAGNOSIS — Z90711 Acquired absence of uterus with remaining cervical stump: Secondary | ICD-10-CM | POA: Diagnosis not present

## 2019-05-17 DIAGNOSIS — Z801 Family history of malignant neoplasm of trachea, bronchus and lung: Secondary | ICD-10-CM | POA: Insufficient documentation

## 2019-05-17 DIAGNOSIS — Z888 Allergy status to other drugs, medicaments and biological substances status: Secondary | ICD-10-CM | POA: Insufficient documentation

## 2019-05-17 DIAGNOSIS — Z8249 Family history of ischemic heart disease and other diseases of the circulatory system: Secondary | ICD-10-CM | POA: Diagnosis not present

## 2019-05-17 DIAGNOSIS — Z79899 Other long term (current) drug therapy: Secondary | ICD-10-CM | POA: Diagnosis not present

## 2019-05-17 HISTORY — PX: IR IMAGING GUIDED PORT INSERTION: IMG5740

## 2019-05-17 HISTORY — DX: Dyspnea, unspecified: R06.00

## 2019-05-17 LAB — COMPREHENSIVE METABOLIC PANEL
ALT: 28 U/L (ref 0–44)
AST: 29 U/L (ref 15–41)
Albumin: 3.7 g/dL (ref 3.5–5.0)
Alkaline Phosphatase: 104 U/L (ref 38–126)
Anion gap: 11 (ref 5–15)
BUN: 14 mg/dL (ref 8–23)
CO2: 22 mmol/L (ref 22–32)
Calcium: 9.6 mg/dL (ref 8.9–10.3)
Chloride: 104 mmol/L (ref 98–111)
Creatinine, Ser: 0.77 mg/dL (ref 0.44–1.00)
GFR calc Af Amer: >60 mL/min (ref >60)
GFR calc non Af Amer: >60 mL/min (ref >60)
Glucose, Bld: 95 mg/dL (ref 70–99)
Potassium: 4.4 mmol/L (ref 3.5–5.1)
Sodium: 137 mmol/L (ref 135–145)
Total Bilirubin: 0.5 mg/dL (ref 0.3–1.2)
Total Protein: 7.6 g/dL (ref 6.5–8.1)

## 2019-05-17 LAB — PROTIME-INR
INR: 0.9 (ref 0.8–1.2)
Prothrombin Time: 12 seconds (ref 11.4–15.2)

## 2019-05-17 LAB — CBC WITH DIFFERENTIAL/PLATELET
Abs Immature Granulocytes: 0.46 10*3/uL — ABNORMAL HIGH (ref 0.00–0.07)
Basophils Absolute: 0 10*3/uL (ref 0.0–0.1)
Basophils Relative: 0 %
Eosinophils Absolute: 0.1 10*3/uL (ref 0.0–0.5)
Eosinophils Relative: 2 %
HCT: 36.4 % (ref 36.0–46.0)
Hemoglobin: 11.5 g/dL — ABNORMAL LOW (ref 12.0–15.0)
Immature Granulocytes: 5 %
Lymphocytes Relative: 27 %
Lymphs Abs: 2.6 10*3/uL (ref 0.7–4.0)
MCH: 29.6 pg (ref 26.0–34.0)
MCHC: 31.6 g/dL (ref 30.0–36.0)
MCV: 93.6 fL (ref 80.0–100.0)
Monocytes Absolute: 1.2 10*3/uL — ABNORMAL HIGH (ref 0.1–1.0)
Monocytes Relative: 12 %
Neutro Abs: 5.1 10*3/uL (ref 1.7–7.7)
Neutrophils Relative %: 54 %
Platelets: 533 10*3/uL — ABNORMAL HIGH (ref 150–400)
RBC: 3.89 MIL/uL (ref 3.87–5.11)
RDW: 17.4 % — ABNORMAL HIGH (ref 11.5–15.5)
WBC: 9.5 10*3/uL (ref 4.0–10.5)
nRBC: 0 % (ref 0.0–0.2)

## 2019-05-17 LAB — BASIC METABOLIC PANEL
Anion gap: 12 (ref 5–15)
BUN: 14 mg/dL (ref 8–23)
CO2: 24 mmol/L (ref 22–32)
Calcium: 9.6 mg/dL (ref 8.9–10.3)
Chloride: 100 mmol/L (ref 98–111)
Creatinine, Ser: 0.8 mg/dL (ref 0.44–1.00)
GFR calc Af Amer: >60 mL/min (ref >60)
GFR calc non Af Amer: >60 mL/min (ref >60)
Glucose, Bld: 100 mg/dL — ABNORMAL HIGH (ref 70–99)
Potassium: 4.4 mmol/L (ref 3.5–5.1)
Sodium: 136 mmol/L (ref 135–145)

## 2019-05-17 MED ORDER — LIDOCAINE-EPINEPHRINE 1 %-1:100000 IJ SOLN
INTRAMUSCULAR | Status: AC
  Filled 2019-05-17: qty 1

## 2019-05-17 MED ORDER — LIDOCAINE HCL (PF) 1 % IJ SOLN
INTRAMUSCULAR | Status: AC | PRN
  Administered 2019-05-17: 5 mL

## 2019-05-17 MED ORDER — MIDAZOLAM HCL 2 MG/2ML IJ SOLN
INTRAMUSCULAR | Status: AC | PRN
  Administered 2019-05-17 (×4): 1 mg via INTRAVENOUS

## 2019-05-17 MED ORDER — CEFAZOLIN SODIUM-DEXTROSE 2-4 GM/100ML-% IV SOLN: 2.0000 g | INTRAVENOUS | Status: AC

## 2019-05-17 MED ORDER — LIDOCAINE HCL 1 % IJ SOLN
INTRAMUSCULAR | Status: AC
  Filled 2019-05-17: qty 20

## 2019-05-17 MED ORDER — SODIUM CHLORIDE 0.9 % IV SOLN: INTRAVENOUS | Status: DC

## 2019-05-17 MED ORDER — LIDOCAINE-EPINEPHRINE (PF) 1 %-1:200000 IJ SOLN
INTRAMUSCULAR | Status: AC | PRN
  Administered 2019-05-17: 10 mL

## 2019-05-17 MED ORDER — HEPARIN SOD (PORK) LOCK FLUSH 100 UNIT/ML IV SOLN
INTRAVENOUS | Status: AC
  Filled 2019-05-17: qty 5

## 2019-05-17 MED ORDER — FENTANYL CITRATE (PF) 100 MCG/2ML IJ SOLN
INTRAMUSCULAR | Status: AC
  Filled 2019-05-17: qty 2

## 2019-05-17 MED ORDER — FENTANYL CITRATE (PF) 100 MCG/2ML IJ SOLN
INTRAMUSCULAR | Status: AC | PRN
  Administered 2019-05-17 (×2): 50 ug via INTRAVENOUS

## 2019-05-17 MED ORDER — CEFAZOLIN SODIUM-DEXTROSE 2-4 GM/100ML-% IV SOLN
INTRAVENOUS | Status: AC
  Administered 2019-05-17: 2 g via INTRAVENOUS
  Filled 2019-05-17: qty 100

## 2019-05-17 MED ORDER — MIDAZOLAM HCL 2 MG/2ML IJ SOLN
INTRAMUSCULAR | Status: AC
  Filled 2019-05-17: qty 4

## 2019-05-17 MED ORDER — HEPARIN SOD (PORK) LOCK FLUSH 100 UNIT/ML IV SOLN
INTRAVENOUS | Status: AC | PRN
  Administered 2019-05-17: 500 [IU] via INTRAVENOUS

## 2019-05-17 NOTE — Discharge Instructions (Signed)
Please remember to keep area dry for 5 days before getting wet. We have given you gauze and Tegaderm to use the next couple of days. Place the gauze on over the steri-strips so they don't get pulled off when removing the Tegaderm.   Also, do not use EMLA cream or lotion over port site until all steri-strips have come off on their own.        Implanted Port Insertion, Care After This sheet gives you information about how to care for yourself after your procedure. Your health care provider may also give you more specific instructions. If you have problems or questions, contact your health care provider. What can I expect after the procedure? After the procedure, it is common to have:  Discomfort at the port insertion site.  Bruising on the skin over the port. This should improve over 3-4 days. Follow these instructions at home: [facility] care  After your port is placed, you will get a manufacturer's information card. The card has information about your port. Keep this card with you at all times.  Take care of the port as told by your health care provider. Ask your health care provider if you or a family member can get training for taking care of the port at home. A home health care nurse may also take care of the port.  Make sure to remember what type of port you have. Incision care  Follow instructions from your health care provider about how to take care of your port insertion site. Make sure you: ? Wash your hands with soap and water before and after you change your bandage (dressing). If soap and water are not available, use hand sanitizer. ? Change your dressing as told by your health care provider. ? Leave stitches (sutures), skin glue, or adhesive strips in place. These skin closures may need to stay in place for 2 weeks or longer. If adhesive strip edges start to loosen and curl up, you may trim the loose edges. Do not remove adhesive strips completely unless your health care  provider tells you to do that.  Check your port insertion site every day for signs of infection. Check for: ? Redness, swelling, or pain. ? Fluid or blood. ? Warmth. ? Pus or a bad smell. Activity  Return to your normal activities as told by your health care provider. Ask your health care provider what activities are safe for you.  Do not lift anything that is heavier than 10 lb (4.5 kg), or the limit that you are told, until your health care provider says that it is safe. General instructions  Take over-the-counter and prescription medicines only as told by your health care provider.  Do not take baths, swim, or use a hot tub until your health care provider approves. Ask your health care provider if you may take showers. You may only be allowed to take sponge baths.  Do not drive for 24 hours if you were given a sedative during your procedure.  Wear a medical alert bracelet in case of an emergency. This will tell any health care providers that you have a port.  Keep all follow-up visits as told by your health care provider. This is important. Contact a health care provider if:  You cannot flush your port with saline as directed, or you cannot draw blood from the port.  You have a fever or chills.  You have redness, swelling, or pain around your port insertion site.  You have fluid  or blood coming from your port insertion site.  Your port insertion site feels warm to the touch.  You have pus or a bad smell coming from the port insertion site. Get help right away if:  You have chest pain or shortness of breath.  You have bleeding from your port that you cannot control. Summary  Take care of the port as told by your health care provider. Keep the manufacturer's information card with you at all times.  Change your dressing as told by your health care provider.  Contact a health care provider if you have a fever or chills or if you have redness, swelling, or pain around  your port insertion site.  Keep all follow-up visits as told by your health care provider. This information is not intended to replace advice given to you by your health care provider. Make sure you discuss any questions you have with your health care provider. Document Revised: 11/29/2017 Document Reviewed: 11/29/2017 Elsevier Patient Education  [place]. Moderate Conscious Sedation, Adult, Care After These instructions provide you with information about caring for yourself after your procedure. Your health care provider may also give you more specific instructions. Your treatment has been planned according to current medical practices, but problems sometimes occur. Call your health care provider if you have any problems or questions after your procedure. What can I expect after the procedure? After your procedure, it is common:  To feel sleepy for several hours.  To feel clumsy and have poor balance for several hours.  To have poor judgment for several hours.  To vomit if you eat too soon. Follow these instructions at home: For at least 24 hours after the procedure:   Do not: ? Participate in activities where you could fall or become injured. ? Drive. ? Use heavy machinery. ? Drink alcohol. ? Take sleeping pills or medicines that cause drowsiness. ? Make important decisions or sign legal documents. ? Take care of children on your own.  Rest. Eating and drinking  Follow the diet recommended by your health care provider.  If you vomit: ? Drink water, juice, or soup when you can drink without vomiting. ? Make sure you have little or no nausea before eating solid foods. General instructions  Have a responsible adult stay with you until you are awake and alert.  Take over-the-counter and prescription medicines only as told by your health care provider.  If you smoke, do not smoke without supervision.  Keep all follow-up visits as told by your health care  provider. This is important. Contact a health care provider if:  You keep feeling nauseous or you keep vomiting.  You feel light-headed.  You develop a rash.  You have a fever. Get help right away if:  You have trouble breathing. This information is not intended to replace advice given to you by your health care provider. Make sure you discuss any questions you have with your health care provider. Document Revised: 04/15/2017 Document Reviewed: 08/23/2015 Elsevier Patient Education  2020 [employer].

## 2019-05-17 NOTE — H&P (Signed)
Chief Complaint: Patient was seen in consultation today for ovarian cancer/Port-a-cath placement.  Referring Physician(s): [name]  Supervising Physician: Markus Daft  Patient Status: [facility] - Out-pt  History of Present Illness: Connie Heath is a 68 y.o. female with a past medical history of hypertension, hyperlipidemia, GERD, metastatic ovarian cancer (malignant ascites and peritoneal carcinomatosis), shingles, and depression. She was unfortunately diagnosed with metastatic ovarian cancer in 01/2019. Her cancer is managed by Dr. Alvy Bimler. She is currently undergoing systemic chemotherapy for management. She came to [facility] IR 04/26/2019 for Port-a-cath placement, however labs from procedure day revealed leukocytosis, so procedure was aborted and rescheduled for today.  IR requested by Dr. Alvy Bimler for possible image-guided Port-a-cath placement. Patient awake and alert sitting in bed with no complaints at this time. Denies fever, chills, chest pain, dyspnea, abdominal pain, or headache.   Past Medical History:  Diagnosis Date  . Depression   . Dyspnea    with increased exertion   . Family history of lung cancer   . Family history of stomach cancer   . Fatigue   . GERD (gastroesophageal reflux disease)   . Hyperlipidemia    H/o myalgias with Vytorin but tolerating simvastatin  . Hypertension   . Myalgia    Hx of   . S/P abdominal paracentesis 04/25/2019   pulled off 2.5 liters  . S/P cholecystectomy   . S/P hysterectomy   . Shingles     Past Surgical History:  Procedure Laterality Date  . JOINT REPLACEMENT     right total hip   . LAPAROSCOPIC CHOLECYSTECTOMY    . PARACENTESIS    . PARTIAL HYSTERECTOMY    . TONSILLECTOMY AND ADENOIDECTOMY      Allergies: Bee venom, Iodinated diagnostic agents, Amlodipine, Demerol [meperidine hcl], Lisinopril, Losartan, Other, and Iodine  Medications: Prior to Admission medications   Medication Sig Start Date End Date Taking?  Authorizing Provider  esomeprazole (NEXIUM) 40 MG capsule Take 1 daily in the morning 05/01/19  Yes Gorsuch, Ni, MD  aspirin EC 81 MG tablet Take 81 mg by mouth daily.    [provider]  Cholecalciferol (VITAMIN D-3 PO) Take 4,000 Int'l Units by mouth.    [provider]  dexamethasone (DECADRON) 4 MG tablet Take 2 tabs at the night before and 2 tab the morning of chemotherapy, every 3 weeks, by mouth. Please dispense 24 tabs for total 6 cycles 04/20/19   [name], MD  FLUoxetine (PROZAC) 20 MG capsule Take 1 capsule (20 mg total) by mouth daily. 12/16/15   Iran Planas L, PA-C  lidocaine-prilocaine (EMLA) cream Apply to affected area once 04/20/19   [name], MD  ondansetron (ZOFRAN-ODT) 8 MG disintegrating tablet Take 1 tablet (8 mg total) by mouth every 8 (eight) hours as needed for nausea or vomiting. 04/20/19   [name], MD  oxyCODONE (OXY IR/ROXICODONE) 5 MG immediate release tablet Take 1 tablet (5 mg total) by mouth every 4 (four) hours as needed for severe pain. 04/20/19   [name], MD  prochlorperazine (COMPAZINE) 10 MG tablet Take 1 tablet (10 mg total) by mouth every 6 (six) hours as needed (Nausea or vomiting). 04/20/19   [name], MD  simvastatin (ZOCOR) 40 MG tablet Take 1 tablet (40 mg total) by mouth at bedtime. 12/17/15   Breeback, Royetta Car, PA-C  VIRTUSSIN A/C 100-10 MG/5ML syrup SMARTSIG:5-10 Milliliter(s) By Mouth Every Night PRN 04/13/19   [provider]     Family History  Problem Relation Age  of Onset  . Lung cancer Mother   . Cancer Father 32       Stomach  . Heart failure Maternal Grandmother 80       CHF  . Heart failure Maternal Grandfather 50       Acute MI  . Cancer Paternal Aunt        either colon or liver    Social History   Socioeconomic History  . Marital status: Married    Spouse name: Not on file  . Number of children: Not on file  . Years of education: Not on file  . Highest education level: Not on file    Occupational History  . 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 Smoking status: Never Smoker  . Smokeless tobacco: Never Used  Substance and Sexual Activity  . Alcohol use: Yes    Comment: Occasional  . Drug use: No  . Sexual activity: Not Currently  Other Topics Concern  . Not on file  Social History Narrative   Married   Gets regular exercise   Social Determinants of Health   Financial Resource Strain:   . Difficulty of Paying Living Expenses: Not on file  Food Insecurity:   . Worried About Charity fundraiser in the Last Year: Not on file  . Ran Out of Food in the Last Year: Not on file  Transportation Needs:   . Lack of Transportation (Medical): Not on file  . Lack of Transportation (Non-Medical): Not on file  Physical Activity:   . Days of Exercise per Week: Not on file  . Minutes of Exercise per Session: Not on file  Stress:   . Feeling of Stress : Not on file  Social Connections:   . Frequency of Communication with Friends and Family: Not on file  . Frequency of Social Gatherings with Friends and Family: Not on file  . Attends Religious Services: Not on file  . Active Member of Clubs or Organizations: Not on file  . Attends Archivist Meetings: Not on file  . Marital Status: Not on file     Review of Systems: A 12 point ROS discussed and pertinent positives are indicated in the HPI above.  All other systems are negative.  Review of Systems  Constitutional: Negative for chills and fever.  Respiratory: Negative for shortness of breath and wheezing.   Cardiovascular: Negative for chest pain and palpitations.  Gastrointestinal: Negative for abdominal pain.  Neurological: Negative for headaches.  Psychiatric/Behavioral: Negative for behavioral problems and confusion.    Vital Signs: BP (!) 157/94 (BP Location: Right Arm)   Pulse 73   Temp 97.8 F (36.6 C) (Oral)   Resp 18    SpO2 95%   Physical Exam Vitals and nursing note reviewed.  Constitutional:      General: She is not in acute distress.    Appearance: Normal appearance.  Cardiovascular:     Rate and Rhythm: Normal rate and regular rhythm.     Heart sounds: Normal heart sounds. No murmur.  Pulmonary:     Effort: Pulmonary effort is normal. No respiratory distress.     Breath sounds: Normal breath sounds. No wheezing.  Skin:    General: Skin is warm and dry.  Neurological:     Mental Status: She is alert and oriented to person, place, and time.  Psychiatric:  Mood and Affect: Mood normal.        Behavior: Behavior normal.      MD Evaluation Airway: WNL Heart: WNL Abdomen: WNL Chest/ Lungs: WNL ASA  Classification: 3 Mallampati/Airway Score: Two   Imaging: US Paracentesis  Result Date: 04/25/2019 INDICATION: Patient with history of right iliac fossa mass, elevated CA 125, peritoneal carcinomatosis with recurrent malignant ascites; request received for therapeutic paracentesis. EXAM: ULTRASOUND GUIDED THERAPEUTIC PARACENTESIS MEDICATIONS: None COMPLICATIONS: None immediate. PROCEDURE: Informed written consent was obtained from the patient after a discussion of the risks, benefits and alternatives to treatment. A timeout was performed prior to the initiation of the procedure. Initial ultrasound scanning demonstrates a moderate amount of ascites within the right mid to lower abdominal quadrant. The right mid to lower abdomen was prepped and draped in the usual sterile fashion. 1% lidocaine was used for local anesthesia. Following this, a 19 gauge, 10-cm, Yueh catheter was introduced. An ultrasound image was saved for documentation purposes. The paracentesis was performed. The catheter was removed and a dressing was applied. The patient tolerated the procedure well without immediate post procedural complication. FINDINGS: A total of approximately 2.5 liters of blood-tinged fluid was removed.  IMPRESSION: Successful ultrasound-guided therapeutic paracentesis yielding 2.5 liters of peritoneal fluid. Read by: Rowe Robert, PA-C Electronically Signed   By: Markus Daft M.D.   On: 04/25/2019 13:25   US Paracentesis  Result Date: 04/18/2019 INDICATION: Patient with history of right iliac fossa mass, peritoneal nodules, ascites, elevated CA 125; request received for diagnostic and therapeutic paracentesis. EXAM: ULTRASOUND GUIDED DIAGNOSTIC AND THERAPEUTIC PARACENTESIS MEDICATIONS: None COMPLICATIONS: None immediate. PROCEDURE: Informed written consent was obtained from the patient after a discussion of the risks, benefits and alternatives to treatment. A timeout was performed prior to the initiation of the procedure. Initial ultrasound scanning demonstrates a large amount of ascites within the right lower abdominal quadrant. The right lower abdomen was prepped and draped in the usual sterile fashion. 1% lidocaine was used for local anesthesia. Following this, a 19 gauge, 10-cm, Yueh catheter was introduced. An ultrasound image was saved for documentation purposes. The paracentesis was performed. The catheter was removed and a dressing was applied. The patient tolerated the procedure well without immediate post procedural complication. FINDINGS: A total of approximately 3.2 liters of blood-tinged fluid was removed. Samples were sent to the laboratory as requested by the clinical team. IMPRESSION: Successful ultrasound-guided diagnostic and therapeutic paracentesis yielding 3.2 liters of peritoneal fluid. Read by: Rowe Robert, PA-C Electronically Signed   By: Jacqulynn Cadet M.D.   On: 04/18/2019 11:49    Labs:  CBC: Recent Labs    04/16/19 0949 04/26/19 1055  WBC 12.7* 14.1*  HGB 11.2* 11.1*  HCT 32.9* 34.1*  PLT 759* 852*    COAGS: Recent Labs    04/26/19 1055  INR 1.0    BMP: Recent Labs    04/16/19 0949 04/26/19 1055  NA 131* 133*  K 3.9 4.3  CL 93* 97*  CO2 24 24    GLUCOSE 111* 104*  BUN 7* 5*  CALCIUM 9.2 8.7*  CREATININE 0.97 0.86  GFRNONAA >60 >60  GFRAA >60 >60    LIVER FUNCTION TESTS: Recent Labs    04/16/19 0949  BILITOT 0.7  AST 41  ALT 41  ALKPHOS 184*  PROT 7.0  ALBUMIN 2.2*     Assessment and Plan:  Ovarian cancer currently undergoing systemic chemotherapy. Plan for image-guided Port-a-cath placement today in IR. Patient is NPO. Afebrile. She does  not take blood thinners. INR pending.  Risks and benefits of image guided port-a-catheter placement were discussed with the patient including, but not limited to bleeding, infection, pneumothorax, or fibrin sheath development and need for additional procedures. All of the patient's questions were answered, patient is agreeable to proceed. Consent signed and in chart.   Thank you for this interesting consult.  I greatly enjoyed meeting ADDINE MAURITZ and look forward to participating in their care.  A copy of this report was sent to the requesting provider on this date.  Electronically Signed: Earley Abide, PA-C 05/17/2019, 8:41 AM   I spent a total of 25 Minutes in face to face in clinical consultation, greater than 50% of which was counseling/coordinating care for ovarian cancer/Port-a-cath placement.

## 2019-05-17 NOTE — Procedures (Addendum)
Interventional Radiology Procedure:   Indications: Peritoneal carcinomatosis  Procedure: Port placement  Findings: Right jugular port, tip at SVC/RA junction  Complications: None     EBL: Minimal, less than 10 ml  Plan: Discharge in one hour.  Keep port site and incisions dry for at least 5 days.     Brinna Divelbiss R. Anselm Pancoast, MD  Pager: [phone]

## 2019-05-21 ENCOUNTER — Other Ambulatory Visit: Payer: Self-pay

## 2019-05-21 ENCOUNTER — Inpatient Hospital Stay: Payer: Medicare HMO

## 2019-05-21 ENCOUNTER — Encounter: Payer: Self-pay | Admitting: Hematology and Oncology

## 2019-05-21 ENCOUNTER — Inpatient Hospital Stay: Payer: Medicare HMO | Attending: Gynecologic Oncology | Admitting: Hematology and Oncology

## 2019-05-21 ENCOUNTER — Other Ambulatory Visit: Payer: Self-pay | Admitting: Hematology and Oncology

## 2019-05-21 ENCOUNTER — Telehealth: Payer: Self-pay | Admitting: Hematology and Oncology

## 2019-05-21 ENCOUNTER — Telehealth: Payer: Self-pay | Admitting: *Deleted

## 2019-05-21 VITALS — HR 93

## 2019-05-21 DIAGNOSIS — R971 Elevated cancer antigen 125 [CA 125]: Secondary | ICD-10-CM | POA: Diagnosis not present

## 2019-05-21 DIAGNOSIS — G893 Neoplasm related pain (acute) (chronic): Secondary | ICD-10-CM | POA: Diagnosis not present

## 2019-05-21 DIAGNOSIS — T451X5A Adverse effect of antineoplastic and immunosuppressive drugs, initial encounter: Secondary | ICD-10-CM | POA: Insufficient documentation

## 2019-05-21 DIAGNOSIS — C786 Secondary malignant neoplasm of retroperitoneum and peritoneum: Secondary | ICD-10-CM

## 2019-05-21 DIAGNOSIS — Z888 Allergy status to other drugs, medicaments and biological substances status: Secondary | ICD-10-CM | POA: Insufficient documentation

## 2019-05-21 DIAGNOSIS — Z79899 Other long term (current) drug therapy: Secondary | ICD-10-CM | POA: Diagnosis not present

## 2019-05-21 DIAGNOSIS — K449 Diaphragmatic hernia without obstruction or gangrene: Secondary | ICD-10-CM | POA: Diagnosis not present

## 2019-05-21 DIAGNOSIS — R180 Malignant ascites: Secondary | ICD-10-CM | POA: Insufficient documentation

## 2019-05-21 DIAGNOSIS — G620 Drug-induced polyneuropathy: Secondary | ICD-10-CM | POA: Insufficient documentation

## 2019-05-21 DIAGNOSIS — I700 Atherosclerosis of aorta: Secondary | ICD-10-CM | POA: Diagnosis not present

## 2019-05-21 DIAGNOSIS — Z885 Allergy status to narcotic agent status: Secondary | ICD-10-CM | POA: Diagnosis not present

## 2019-05-21 DIAGNOSIS — R102 Pelvic and perineal pain: Secondary | ICD-10-CM | POA: Insufficient documentation

## 2019-05-21 DIAGNOSIS — C481 Malignant neoplasm of specified parts of peritoneum: Secondary | ICD-10-CM | POA: Diagnosis not present

## 2019-05-21 DIAGNOSIS — C801 Malignant (primary) neoplasm, unspecified: Secondary | ICD-10-CM | POA: Diagnosis not present

## 2019-05-21 MED ORDER — SODIUM CHLORIDE 0.9 % IV SOLN: 10.0000 mg | Freq: Once | INTRAVENOUS | Status: DC

## 2019-05-21 MED ORDER — DIPHENHYDRAMINE HCL 50 MG/ML IJ SOLN
INTRAMUSCULAR | Status: AC
  Filled 2019-05-21: qty 1

## 2019-05-21 MED ORDER — DEXAMETHASONE SODIUM PHOSPHATE 10 MG/ML IJ SOLN
10.0000 mg | Freq: Once | INTRAMUSCULAR | Status: AC
  Administered 2019-05-21: 10 mg via INTRAVENOUS

## 2019-05-21 MED ORDER — FAMOTIDINE IN NACL 20-0.9 MG/50ML-% IV SOLN
20.0000 mg | Freq: Once | INTRAVENOUS | Status: AC
  Administered 2019-05-21: 20 mg via INTRAVENOUS

## 2019-05-21 MED ORDER — SODIUM CHLORIDE 0.9 % IV SOLN
150.0000 mg | Freq: Once | INTRAVENOUS | Status: AC
  Administered 2019-05-21: 150 mg via INTRAVENOUS
  Filled 2019-05-21: qty 5

## 2019-05-21 MED ORDER — PALONOSETRON HCL INJECTION 0.25 MG/5ML
INTRAVENOUS | Status: AC
  Filled 2019-05-21: qty 5

## 2019-05-21 MED ORDER — DIPHENHYDRAMINE HCL 50 MG/ML IJ SOLN
50.0000 mg | Freq: Once | INTRAMUSCULAR | Status: AC
  Administered 2019-05-21: 50 mg via INTRAVENOUS

## 2019-05-21 MED ORDER — SODIUM CHLORIDE 0.9 % IV SOLN
Freq: Once | INTRAVENOUS | Status: AC
  Filled 2019-05-21: qty 250

## 2019-05-21 MED ORDER — SODIUM CHLORIDE 0.9% FLUSH
10.0000 mL | INTRAVENOUS | Status: DC | PRN
  Administered 2019-05-21: 17:00:00 10 mL
  Filled 2019-05-21: qty 10

## 2019-05-21 MED ORDER — SODIUM CHLORIDE 0.9 % IV SOLN
550.0000 mg | Freq: Once | INTRAVENOUS | Status: AC
  Administered 2019-05-21: 16:00:00 550 mg via INTRAVENOUS
  Filled 2019-05-21: qty 55

## 2019-05-21 MED ORDER — SODIUM CHLORIDE 0.9 % IV SOLN
175.0000 mg/m2 | Freq: Once | INTRAVENOUS | Status: AC
  Administered 2019-05-21: 336 mg via INTRAVENOUS
  Filled 2019-05-21: qty 56

## 2019-05-21 MED ORDER — FAMOTIDINE IN NACL 20-0.9 MG/50ML-% IV SOLN
INTRAVENOUS | Status: AC
  Filled 2019-05-21: qty 50

## 2019-05-21 MED ORDER — HEPARIN SOD (PORK) LOCK FLUSH 100 UNIT/ML IV SOLN
500.0000 [IU] | Freq: Once | INTRAVENOUS | Status: AC | PRN
  Administered 2019-05-21: 500 [IU]
  Filled 2019-05-21: qty 5

## 2019-05-21 MED ORDER — PALONOSETRON HCL INJECTION 0.25 MG/5ML
0.2500 mg | Freq: Once | INTRAVENOUS | Status: AC
  Administered 2019-05-21: 0.25 mg via INTRAVENOUS

## 2019-05-21 MED ORDER — DEXAMETHASONE SODIUM PHOSPHATE 10 MG/ML IJ SOLN
INTRAMUSCULAR | Status: AC
  Filled 2019-05-21: qty 1

## 2019-05-21 NOTE — Assessment & Plan Note (Signed)
She denies further abdominal pain She has transient bone aches after treatment that is resolved We discussed conservative approach with taking pain medicine as needed

## 2019-05-21 NOTE — Telephone Encounter (Signed)
Patient called. She is running late. She is having difficulty with diarrhea this morning. She will be here as soon as possible.

## 2019-05-21 NOTE — Progress Notes (Signed)
[place] OFFICE PROGRESS NOTE  Patient Care Team: Larene Beach, MD as PCP - General (Family Medicine) Awanda Mink Craige Cotta, RN as Oncology Nurse Navigator (Oncology)  ASSESSMENT & PLAN:  Peritoneal carcinomatosis [facility]) Clinically, she has excellent response to therapy She have near normalization of all her blood work and resolution of ascites on exam We will proceed with treatment cycle 2 without dose adjustment I recommend minimum 3 cycles of treatment before repeat imaging study and possible interval debulking surgery after that  Peripheral neuropathy due to chemotherapy [facility]) she has mild peripheral neuropathy, likely related to side effects of treatment. It is only mild, not bothering the patient. I will observe for now If it gets worse in the future, I will consider modifying the dose of the treatment   Cancer associated pain She denies further abdominal pain She has transient bone aches after treatment that is resolved We discussed conservative approach with taking pain medicine as needed   No orders of the defined types were placed in this encounter.   INTERVAL HISTORY: Please see below for problem oriented charting.. She is seen prior to cycle 2 of therapy She responded well to cycle 1 of treatment Her abdomen is less distended and she denies further sensation of bloating No recent nausea She has transient episode of bone pain and pelvic pain after treatment but that has resolved She has very mild peripheral neuropathy affecting the tips of fingers but not her toes Her appetite is good  SUMMARY OF ONCOLOGIC HISTORY: Oncology History  Peritoneal carcinomatosis ([place])  01/16/2019 Initial Diagnosis   She had vague symptom of dyspareunia. Presented to ER for evaluation in November for abdominal pain and vague symptom of fullness and imaging showed abnormalities   04/16/2019 Imaging   1. Nonvisualization of the appendix. However, there is a large volume of  ascites within the abdomen and pelvis which appears partially loculated. In the acute setting findings may be the sequelae of peritonitis. However, there are multiple partially calcified peritoneal nodules throughout the omentum which are concerning for peritoneal carcinomatosis. Noncalcified nodule in the left lower quadrant peritoneal reflection is noted. Further investigation with diagnostic paracentesis is advised. 2. Ill-defined low-density mass within the right iliac fossa is suspected and may represent an ovarian neoplasm. 3. Aortic atherosclerosis. 4. Small bilateral pleural effusions. 5. Hiatal hernia. 6. Prior granulomatous disease.   Aortic Atherosclerosis (ICD10-I70.0).   04/17/2019 Tumor Marker   Patient's tumor was tested for the following markers: CA-125 Results of the tumor marker test revealed 288.   04/18/2019 Procedure   Successful ultrasound-guided diagnostic and therapeutic paracentesis yielding 3.2 liters of peritoneal fluid.   04/18/2019 Pathology Results   FINAL MICROSCOPIC DIAGNOSIS:  - Malignant cells present  - See comment   SPECIMEN ADEQUACY:  Satisfactory for evaluation   DIAGNOSTIC COMMENTS:  The cell block has scattered malignant cells.  These atypical cells are positive for cytokeraitn 7, MOC-31, Pax8, and WT-1 but negative for TTF-1 and cytokeratin 20.  Overall, the features are consistent with a primary gynecologic neoplasm   04/20/2019 Cancer Staging   Staging form: Ovary, Fallopian Tube, and Primary Peritoneal Carcinoma, AJCC 8th Edition - Clinical stage from 04/20/2019: FIGO Stage IIIC (cT3c, cN0, cM0) - Signed by [name], MD on 04/20/2019   04/25/2019 Procedure   Successful ultrasound-guided therapeutic paracentesis yielding 2.5 liters of peritoneal fluid.     04/27/2019 -  Chemotherapy   The patient had carboplatin and taxol for chemotherapy treatment.     05/17/2019  Procedure   Placement of a subcutaneous port device. Catheter tip at the  SVC and right atrium junction.     REVIEW OF SYSTEMS:   Constitutional: Denies fevers, chills or abnormal weight loss Eyes: Denies blurriness of vision Ears, nose, mouth, throat, and face: Denies mucositis or sore throat Respiratory: Denies cough, dyspnea or wheezes Cardiovascular: Denies palpitation, chest discomfort or lower extremity swelling Gastrointestinal:  Denies nausea, heartburn or change in bowel habits Skin: Denies abnormal skin rashes Lymphatics: Denies new lymphadenopathy or easy bruising NBehavioral/Psych: Mood is stable, no new changes  All other systems were reviewed with the patient and are negative.  I have reviewed the past medical history, past surgical history, social history and family history with the patient and they are unchanged from previous note.  ALLERGIES:  is allergic to bee venom; iodinated diagnostic agents; amlodipine; demerol [meperidine hcl]; lisinopril; losartan; other; and iodine.  MEDICATIONS:  Current Outpatient Medications  Medication Sig Dispense Refill  . aspirin EC 81 MG tablet Take 81 mg by mouth daily.    . Cholecalciferol (VITAMIN D-3 PO) Take 4,000 Int'l Units by mouth.    . dexamethasone (DECADRON) 4 MG tablet Take 2 tabs at the night before and 2 tab the morning of chemotherapy, every 3 weeks, by mouth. Please dispense 24 tabs for total 6 cycles 60 tablet 0  . esomeprazole (NEXIUM) 40 MG capsule Take 1 daily in the morning 30 capsule 3  . FLUoxetine (PROZAC) 20 MG capsule Take 1 capsule (20 mg total) by mouth daily. 90 capsule 3  . lidocaine-prilocaine (EMLA) cream Apply to affected area once 30 g 3  . ondansetron (ZOFRAN-ODT) 8 MG disintegrating tablet Take 1 tablet (8 mg total) by mouth every 8 (eight) hours as needed for nausea or vomiting. 30 tablet 1  . oxyCODONE (OXY IR/ROXICODONE) 5 MG immediate release tablet Take 1 tablet (5 mg total) by mouth every 4 (four) hours as needed for severe pain. 60 tablet 0  . prochlorperazine  (COMPAZINE) 10 MG tablet Take 1 tablet (10 mg total) by mouth every 6 (six) hours as needed (Nausea or vomiting). 30 tablet 1  . simvastatin (ZOCOR) 40 MG tablet Take 1 tablet (40 mg total) by mouth at bedtime. 90 tablet 4   No current facility-administered medications for this visit.   Facility-Administered Medications Ordered in Other Visits  Medication Dose Route Frequency Provider Last Rate Last Admin  . CARBOplatin (PARAPLATIN) 550 mg in sodium chloride 0.9 % 250 mL chemo infusion  550 mg Intravenous Once Alvy Bimler, Kemet Nijjar, MD      . dexamethasone (DECADRON) injection 10 mg  10 mg Intravenous Once Alvy Bimler, Avinash Maltos, MD      . diphenhydrAMINE (BENADRYL) injection 50 mg  50 mg Intravenous Once Alvy Bimler, Linh Hedberg, MD      . famotidine (PEPCID) IVPB 20 mg premix  20 mg Intravenous Once Alvy Bimler, Liberta Gimpel, MD      . fosaprepitant (EMEND) 150 mg in sodium chloride 0.9 % 145 mL IVPB  150 mg Intravenous Once Alvy Bimler, Krystall Kruckenberg, MD      . heparin lock flush 100 unit/mL  500 Units Intracatheter Once PRN Alvy Bimler, Caran Storck, MD      . PACLitaxel (TAXOL) 336 mg in sodium chloride 0.9 % 500 mL chemo infusion (> 80mg /m2)  175 mg/m2 (Treatment Plan Recorded) Intravenous Once Ezme Duch, MD      . palonosetron (ALOXI) injection 0.25 mg  0.25 mg Intravenous Once Alvy Bimler, Melbourne Jakubiak, MD      . sodium chloride flush (  NS) 0.9 % injection 10 mL  10 mL Intracatheter PRN Alvy Bimler, Adrienne Delay, MD        PHYSICAL EXAMINATION: ECOG PERFORMANCE STATUS: 1 - Symptomatic but completely ambulatory  Vitals:   05/21/19 1036  BP: (!) 155/88  Pulse: (!) 117  Resp: 18  Temp: 98.3 F (36.8 C)  SpO2: 100%   Filed Weights   05/21/19 1036  Weight: 174 lb 3.2 oz (79 kg)    GENERAL:alert, no distress and comfortable SKIN: skin color, texture, turgor are normal, no rashes or significant lesions EYES: normal, Conjunctiva are pink and non-injected, sclera clear OROPHARYNX:no exudate, no erythema and lips, buccal mucosa, and tongue normal  NECK: supple, thyroid normal size,  non-tender, without nodularity LYMPH:  no palpable lymphadenopathy in the cervical, axillary or inguinal LUNGS: clear to auscultation and percussion with normal breathing effort HEART: regular rate & rhythm and no murmurs and no lower extremity edema ABDOMEN:abdomen soft, non-tender and normal bowel sounds Musculoskeletal:no cyanosis of digits and no clubbing  NEURO: alert & oriented x 3 with fluent speech, no focal motor/sensory deficits  LABORATORY DATA:  I have reviewed the data as listed    Component Value Date/Time   NA 136 05/17/2019 0821   NA 137 05/17/2019 0821   NA 137 10/27/2011 0738   K 4.4 05/17/2019 0821   K 4.4 05/17/2019 0821   K 4.1 10/27/2011 0738   CL 100 05/17/2019 0821   CL 104 05/17/2019 0821   CL 105 10/27/2011 0738   CO2 24 05/17/2019 0821   CO2 22 05/17/2019 0821   CO2 26 10/27/2011 0738   GLUCOSE 100 (H) 05/17/2019 0821   GLUCOSE 95 05/17/2019 0821   GLUCOSE 111 (H) 10/27/2011 0738   BUN 14 05/17/2019 0821   BUN 14 05/17/2019 0821   BUN 15 10/27/2011 0738   CREATININE 0.80 05/17/2019 0821   CREATININE 0.77 05/17/2019 0821   CREATININE 1.01 (H) 12/16/2015 1030   CALCIUM 9.6 05/17/2019 0821   CALCIUM 9.6 05/17/2019 0821   CALCIUM 10.0 09/28/2013 0000   PROT 7.6 05/17/2019 0821   PROT 8.1 10/27/2011 0738   ALBUMIN 3.7 05/17/2019 0821   ALBUMIN 3.9 10/27/2011 0738   AST 29 05/17/2019 0821   AST 21 10/27/2011 0738   ALT 28 05/17/2019 0821   ALT 29 10/27/2011 0738   ALKPHOS 104 05/17/2019 0821   ALKPHOS 102 10/27/2011 0738   BILITOT 0.5 05/17/2019 0821   BILITOT 0.3 10/27/2011 0738   GFRNONAA >60 05/17/2019 0821   GFRNONAA >60 05/17/2019 0821   GFRNONAA 59 (L) 12/16/2015 1030   GFRAA >60 05/17/2019 0821   GFRAA >60 05/17/2019 0821   GFRAA 68 12/16/2015 1030    No results found for: SPEP, UPEP  Lab Results  Component Value Date   WBC 9.5 05/17/2019   NEUTROABS 5.1 05/17/2019   HGB 11.5 (L) 05/17/2019   HCT 36.4 05/17/2019   MCV 93.6  05/17/2019   PLT 533 (H) 05/17/2019      Chemistry      Component Value Date/Time   NA 136 05/17/2019 0821   NA 137 05/17/2019 0821   NA 137 10/27/2011 0738   K 4.4 05/17/2019 0821   K 4.4 05/17/2019 0821   K 4.1 10/27/2011 0738   CL 100 05/17/2019 0821   CL 104 05/17/2019 0821   CL 105 10/27/2011 0738   CO2 24 05/17/2019 0821   CO2 22 05/17/2019 0821   CO2 26 10/27/2011 0738   BUN 14 05/17/2019 [id number]  BUN 14 05/17/2019 0821   BUN 15 10/27/2011 0738   CREATININE 0.80 05/17/2019 0821   CREATININE 0.77 05/17/2019 0821   CREATININE 1.01 (H) 12/16/2015 1030   GLU 98 09/28/2013 0000      Component Value Date/Time   CALCIUM 9.6 05/17/2019 0821   CALCIUM 9.6 05/17/2019 0821   CALCIUM 10.0 09/28/2013 0000   ALKPHOS 104 05/17/2019 0821   ALKPHOS 102 10/27/2011 0738   AST 29 05/17/2019 0821   AST 21 10/27/2011 0738   ALT 28 05/17/2019 0821   ALT 29 10/27/2011 0738   BILITOT 0.5 05/17/2019 0821   BILITOT 0.3 10/27/2011 0738       RADIOGRAPHIC STUDIES: I have personally reviewed the radiological images as listed and agreed with the findings in the report. US Paracentesis  Result Date: 04/25/2019 INDICATION: Patient with history of right iliac fossa mass, elevated CA 125, peritoneal carcinomatosis with recurrent malignant ascites; request received for therapeutic paracentesis. EXAM: ULTRASOUND GUIDED THERAPEUTIC PARACENTESIS MEDICATIONS: None COMPLICATIONS: None immediate. PROCEDURE: Informed written consent was obtained from the patient after a discussion of the risks, benefits and alternatives to treatment. A timeout was performed prior to the initiation of the procedure. Initial ultrasound scanning demonstrates a moderate amount of ascites within the right mid to lower abdominal quadrant. The right mid to lower abdomen was prepped and draped in the usual sterile fashion. 1% lidocaine was used for local anesthesia. Following this, a 19 gauge, 10-cm, Yueh catheter was introduced.  An ultrasound image was saved for documentation purposes. The paracentesis was performed. The catheter was removed and a dressing was applied. The patient tolerated the procedure well without immediate post procedural complication. FINDINGS: A total of approximately 2.5 liters of blood-tinged fluid was removed. IMPRESSION: Successful ultrasound-guided therapeutic paracentesis yielding 2.5 liters of peritoneal fluid. Read by: Rowe Robert, PA-C Electronically Signed   By: Markus Daft M.D.   On: 04/25/2019 13:25   IR IMAGING GUIDED PORT INSERTION  Result Date: 05/17/2019 INDICATION: 69 year old with peritoneal carcinomatosis. Port-A-Cath needed for treatment. EXAM: FLUOROSCOPIC AND ULTRASOUND GUIDED PLACEMENT OF A SUBCUTANEOUS PORT COMPARISON:  None. MEDICATIONS: Ancef 2 g; The antibiotic was administered within an appropriate time interval prior to skin puncture. ANESTHESIA/SEDATION: Versed 4.0 mg IV; Fentanyl 100 mcg IV; Moderate Sedation Time:  31 minutes The patient was continuously monitored during the procedure by the interventional radiology nurse under my direct supervision. FLUOROSCOPY TIME:  36 seconds, 4 mGy COMPLICATIONS: None immediate. PROCEDURE: The procedure, risks, benefits, and alternatives were explained to the patient. Questions regarding the procedure were encouraged and answered. The patient understands and consents to the procedure. Patient was placed supine on the interventional table. Ultrasound confirmed a patent right internal jugular vein. Ultrasound image was saved for documentation. The right chest and neck were cleaned with a skin antiseptic and a sterile drape was placed. Maximal barrier sterile technique was utilized including caps, mask, sterile gowns, sterile gloves, sterile drape, hand hygiene and skin antiseptic. The right neck was anesthetized with 1% lidocaine. Small incision was made in the right neck with a blade. Micropuncture set was placed in the right internal jugular  vein with ultrasound guidance. The micropuncture wire was used for measurement purposes. The right chest was anesthetized with 1% lidocaine with epinephrine. #15 blade was used to make an incision and a subcutaneous port pocket was formed. [place] was assembled. Subcutaneous tunnel was formed with a stiff tunneling device. The port catheter was brought through the subcutaneous tunnel. The port was  placed in the subcutaneous pocket and sutured in place. The micropuncture set was exchanged for a peel-away sheath. The catheter was placed through the peel-away sheath and the tip was positioned at the SVC and right atrium junction. Catheter placement was confirmed with fluoroscopy. The port was accessed and flushed with heparinized saline. The port pocket was closed using two layers of absorbable sutures and Steri-Strips. The vein skin site was closed using a single layer of absorbable suture and Steri-Strips. Sterile dressings were applied. Patient tolerated the procedure well without an immediate complication. Ultrasound and fluoroscopic images were taken and saved for this procedure. IMPRESSION: Placement of a subcutaneous port device. Catheter tip at the SVC and right atrium junction. Electronically Signed   By: Markus Daft M.D.   On: 05/17/2019 13:49    All questions were answered. The patient knows to call the clinic with any problems, questions or concerns. No barriers to learning was detected.  I spent 15 minutes counseling the patient face to face. The total time spent in the appointment was 20 minutes and more than 50% was on counseling and review of test results  [name], MD 05/21/2019 11:26 AM

## 2019-05-21 NOTE — Assessment & Plan Note (Signed)
Clinically, she has excellent response to therapy She have near normalization of all her blood work and resolution of ascites on exam We will proceed with treatment cycle 2 without dose adjustment I recommend minimum 3 cycles of treatment before repeat imaging study and possible interval debulking surgery after that

## 2019-05-21 NOTE — Telephone Encounter (Signed)
Scheduled per 1/4 sch msg. Called and spoke with pt, confirmed 1/25 and 2/15 appt

## 2019-05-21 NOTE — Assessment & Plan Note (Signed)
she has mild peripheral neuropathy, likely related to side effects of treatment. It is only mild, not bothering the patient. I will observe for now If it gets worse in the future, I will consider modifying the dose of the treatment  

## 2019-05-21 NOTE — Patient Instructions (Signed)
   [facility] Discharge Instructions for Patients Receiving Chemotherapy  Today you received the following chemotherapy agents Taxol and Carboplatin   To help prevent nausea and vomiting after your treatment, we encourage you to take your nausea medication as directed.    If you develop nausea and vomiting that is not controlled by your nausea medication, call the clinic.   BELOW ARE SYMPTOMS THAT SHOULD BE REPORTED IMMEDIATELY:  *FEVER GREATER THAN 100.5 F  *CHILLS WITH OR WITHOUT FEVER  NAUSEA AND VOMITING THAT IS NOT CONTROLLED WITH YOUR NAUSEA MEDICATION  *UNUSUAL SHORTNESS OF BREATH  *UNUSUAL BRUISING OR BLEEDING  TENDERNESS IN MOUTH AND THROAT WITH OR WITHOUT PRESENCE OF ULCERS  *URINARY PROBLEMS  *BOWEL PROBLEMS  UNUSUAL RASH Items with * indicate a potential emergency and should be followed up as soon as possible.  Feel free to call the clinic should you have any questions or concerns. The clinic phone number is [phone].  Please show the CHEMO ALERT CARD at check-in to the Emergency Department and triage nurse.   

## 2019-05-28 ENCOUNTER — Telehealth: Payer: Self-pay | Admitting: *Deleted

## 2019-05-28 NOTE — Telephone Encounter (Signed)
Received call from pt stating she had a 2nd treatment last Monday & started having loose stools on Thurs.  Friday, this progressed & Sat was full blown diarrhea.  She started imodium on Sat & took 2 to start & then 1 every 2 hours & 1 every 4 hours at hs.  She normally take miralax but hasn't taken since Wednesday.  She has been pouring fluids in & has even tried [employer] but is getting tired of that.  She reports losing 6.5 to 7 lbs since Friday or Saturday.  She denies fever.  She reports that he tummy feels gripey.  She has not had a BM today.  Encouraged to just do clear liquids for now & message routed to Dr Marlinda Mike RN

## 2019-05-28 NOTE — Telephone Encounter (Signed)
I will address her neuropathy in her next visit

## 2019-05-28 NOTE — Telephone Encounter (Signed)
Called back. No bm since yesterday am and it was a loose stool. She is able to hydrate with liquids. She is drinking as much as she can. She is going to take 2 imodium, then attempt solid foods later today. Instructed to call the office if Lomotil Rx needed and call in the am with a update. She verbalized understanding.  She is complaining of neuropathy in her fingers tips and it has started in her toes.

## 2019-05-29 ENCOUNTER — Telehealth: Payer: Self-pay

## 2019-05-29 NOTE — Telephone Encounter (Signed)
She called and left a message to call her. She is calling to give a update.  Called back. No bm since Sunday. She stopped the imodium yesterday. She had real food today with no problems and she is still drinking a lot of fluids. She feels better today. She is afraid she may get constipated and will start the Miralax back tomorrow am. Told her Dr. Alvy Bimler will address her neuropathy issues in her next visit. Instructed to call the office if needed. She verbalized understanding.

## 2019-05-30 ENCOUNTER — Other Ambulatory Visit: Payer: Self-pay | Admitting: Gynecologic Oncology

## 2019-05-30 DIAGNOSIS — R180 Malignant ascites: Secondary | ICD-10-CM

## 2019-05-30 DIAGNOSIS — C786 Secondary malignant neoplasm of retroperitoneum and peritoneum: Secondary | ICD-10-CM

## 2019-06-11 ENCOUNTER — Other Ambulatory Visit: Payer: Self-pay

## 2019-06-11 ENCOUNTER — Inpatient Hospital Stay: Payer: Medicare HMO

## 2019-06-11 ENCOUNTER — Inpatient Hospital Stay (HOSPITAL_BASED_OUTPATIENT_CLINIC_OR_DEPARTMENT_OTHER): Payer: Medicare HMO | Admitting: Hematology and Oncology

## 2019-06-11 ENCOUNTER — Encounter: Payer: Self-pay | Admitting: Hematology and Oncology

## 2019-06-11 VITALS — BP 173/99 | HR 116 | Temp 97.9°F | Resp 18 | Ht 67.0 in | Wt 179.0 lb

## 2019-06-11 VITALS — BP 167/99 | HR 102 | Temp 97.9°F | Resp 18

## 2019-06-11 DIAGNOSIS — C786 Secondary malignant neoplasm of retroperitoneum and peritoneum: Secondary | ICD-10-CM

## 2019-06-11 DIAGNOSIS — C801 Malignant (primary) neoplasm, unspecified: Secondary | ICD-10-CM | POA: Diagnosis not present

## 2019-06-11 DIAGNOSIS — C481 Malignant neoplasm of specified parts of peritoneum: Secondary | ICD-10-CM | POA: Diagnosis not present

## 2019-06-11 DIAGNOSIS — G620 Drug-induced polyneuropathy: Secondary | ICD-10-CM | POA: Diagnosis not present

## 2019-06-11 DIAGNOSIS — G893 Neoplasm related pain (acute) (chronic): Secondary | ICD-10-CM

## 2019-06-11 DIAGNOSIS — T451X5A Adverse effect of antineoplastic and immunosuppressive drugs, initial encounter: Secondary | ICD-10-CM

## 2019-06-11 LAB — CBC WITH DIFFERENTIAL (CANCER CENTER ONLY)
Abs Immature Granulocytes: 0.29 10*3/uL — ABNORMAL HIGH (ref 0.00–0.07)
Basophils Absolute: 0 10*3/uL (ref 0.0–0.1)
Basophils Relative: 0 %
Eosinophils Absolute: 0 10*3/uL (ref 0.0–0.5)
Eosinophils Relative: 0 %
HCT: 34.2 % — ABNORMAL LOW (ref 36.0–46.0)
Hemoglobin: 11.2 g/dL — ABNORMAL LOW (ref 12.0–15.0)
Immature Granulocytes: 3 %
Lymphocytes Relative: 13 %
Lymphs Abs: 1.4 10*3/uL (ref 0.7–4.0)
MCH: 29.9 pg (ref 26.0–34.0)
MCHC: 32.7 g/dL (ref 30.0–36.0)
MCV: 91.4 fL (ref 80.0–100.0)
Monocytes Absolute: 0.1 10*3/uL (ref 0.1–1.0)
Monocytes Relative: 1 %
Neutro Abs: 8.9 10*3/uL — ABNORMAL HIGH (ref 1.7–7.7)
Neutrophils Relative %: 83 %
Platelet Count: 352 10*3/uL (ref 150–400)
RBC: 3.74 MIL/uL — ABNORMAL LOW (ref 3.87–5.11)
RDW: 17.7 % — ABNORMAL HIGH (ref 11.5–15.5)
WBC Count: 10.8 10*3/uL — ABNORMAL HIGH (ref 4.0–10.5)
nRBC: 0 % (ref 0.0–0.2)

## 2019-06-11 LAB — CMP (CANCER CENTER ONLY)
ALT: 21 U/L (ref 0–44)
AST: 19 U/L (ref 15–41)
Albumin: 3.9 g/dL (ref 3.5–5.0)
Alkaline Phosphatase: 92 U/L (ref 38–126)
Anion gap: 10 (ref 5–15)
BUN: 10 mg/dL (ref 8–23)
CO2: 22 mmol/L (ref 22–32)
Calcium: 9.5 mg/dL (ref 8.9–10.3)
Chloride: 102 mmol/L (ref 98–111)
Creatinine: 0.9 mg/dL (ref 0.44–1.00)
GFR, Est AFR Am: >60 mL/min (ref >60)
GFR, Estimated: >60 mL/min (ref >60)
Glucose, Bld: 193 mg/dL — ABNORMAL HIGH (ref 70–99)
Potassium: 4.1 mmol/L (ref 3.5–5.1)
Sodium: 134 mmol/L — ABNORMAL LOW (ref 135–145)
Total Bilirubin: <0.2 mg/dL — ABNORMAL LOW (ref 0.3–1.2)
Total Protein: 8.1 g/dL (ref 6.5–8.1)

## 2019-06-11 MED ORDER — DIPHENHYDRAMINE HCL 50 MG/ML IJ SOLN
50.0000 mg | Freq: Once | INTRAMUSCULAR | Status: AC
  Administered 2019-06-11: 50 mg via INTRAVENOUS

## 2019-06-11 MED ORDER — PALONOSETRON HCL INJECTION 0.25 MG/5ML
0.2500 mg | Freq: Once | INTRAVENOUS | Status: AC
  Administered 2019-06-11: 0.25 mg via INTRAVENOUS

## 2019-06-11 MED ORDER — SODIUM CHLORIDE 0.9% FLUSH
10.0000 mL | INTRAVENOUS | Status: DC | PRN
  Administered 2019-06-11: 10 mL
  Filled 2019-06-11: qty 10

## 2019-06-11 MED ORDER — SODIUM CHLORIDE 0.9% FLUSH
10.0000 mL | Freq: Once | INTRAVENOUS | Status: AC
  Administered 2019-06-11: 10 mL
  Filled 2019-06-11: qty 10

## 2019-06-11 MED ORDER — DIPHENHYDRAMINE HCL 50 MG/ML IJ SOLN
INTRAMUSCULAR | Status: AC
  Filled 2019-06-11: qty 1

## 2019-06-11 MED ORDER — SODIUM CHLORIDE 0.9 % IV SOLN
150.0000 mg | Freq: Once | INTRAVENOUS | Status: AC
  Administered 2019-06-11: 150 mg via INTRAVENOUS
  Filled 2019-06-11: qty 5

## 2019-06-11 MED ORDER — FAMOTIDINE IN NACL 20-0.9 MG/50ML-% IV SOLN
INTRAVENOUS | Status: AC
  Filled 2019-06-11: qty 50

## 2019-06-11 MED ORDER — DEXAMETHASONE SODIUM PHOSPHATE 10 MG/ML IJ SOLN
INTRAMUSCULAR | Status: AC
  Filled 2019-06-11: qty 1

## 2019-06-11 MED ORDER — DEXAMETHASONE SODIUM PHOSPHATE 10 MG/ML IJ SOLN
10.0000 mg | Freq: Once | INTRAMUSCULAR | Status: AC
  Administered 2019-06-11: 12:00:00 10 mg via INTRAVENOUS

## 2019-06-11 MED ORDER — SODIUM CHLORIDE 0.9 % IV SOLN
Freq: Once | INTRAVENOUS | Status: AC
  Filled 2019-06-11: qty 250

## 2019-06-11 MED ORDER — GABAPENTIN 300 MG PO CAPS: 300.0000 mg | ORAL_CAPSULE | Freq: Three times a day (TID) | ORAL | 3 refills | Status: DC

## 2019-06-11 MED ORDER — SODIUM CHLORIDE 0.9 % IV SOLN
553.2000 mg | Freq: Once | INTRAVENOUS | Status: AC
  Administered 2019-06-11: 550 mg via INTRAVENOUS
  Filled 2019-06-11: qty 55

## 2019-06-11 MED ORDER — HEPARIN SOD (PORK) LOCK FLUSH 100 UNIT/ML IV SOLN
500.0000 [IU] | Freq: Once | INTRAVENOUS | Status: AC | PRN
  Administered 2019-06-11: 500 [IU]
  Filled 2019-06-11: qty 5

## 2019-06-11 MED ORDER — PALONOSETRON HCL INJECTION 0.25 MG/5ML
INTRAVENOUS | Status: AC
  Filled 2019-06-11: qty 5

## 2019-06-11 MED ORDER — DEXAMETHASONE 4 MG PO TABS: 4.0000 mg | ORAL_TABLET | Freq: Two times a day (BID) | ORAL | 0 refills | Status: DC

## 2019-06-11 MED ORDER — FAMOTIDINE IN NACL 20-0.9 MG/50ML-% IV SOLN
20.0000 mg | Freq: Once | INTRAVENOUS | Status: AC
  Administered 2019-06-11: 20 mg via INTRAVENOUS

## 2019-06-11 MED ORDER — SODIUM CHLORIDE 0.9 % IV SOLN
175.0000 mg/m2 | Freq: Once | INTRAVENOUS | Status: AC
  Administered 2019-06-11: 336 mg via INTRAVENOUS
  Filled 2019-06-11: qty 56

## 2019-06-11 MED FILL — GABAPENTIN 300 MG CAPSULE: 300 | 30 days supply | Qty: 90 | Fill #0

## 2019-06-11 MED FILL — DEXAMETHASONE 4 MG TABLET: 4 | 7 days supply | Qty: 14 | Fill #0

## 2019-06-11 NOTE — Assessment & Plan Note (Signed)
Clinically, she have responded well to treatment Her last cycle of treatment was complicated by some diarrhea which resolved subsequently She developed significant neuropathy in her hand rather than her feet I suspect the neuropathy was related to previous injury to her hand from neuromas We will proceed with treatment without delay or dose adjustment I plan to order CT imaging next week for objective assessment of response to therapy If she have good response to treatment, she will proceed with interval debulking surgery

## 2019-06-11 NOTE — Assessment & Plan Note (Signed)
She has only grade 1 neuropathy in the feet The pain in her hand is something else She has purchased a compression sleeve I recommend a trial of gabapentin and she agreed We will proceed with similar dose of chemo without adjustment I discussed the risk and side effects of gabapentin with her

## 2019-06-11 NOTE — Assessment & Plan Note (Signed)
Her abdominal pain has resolved She have oxycodone to take as needed

## 2019-06-11 NOTE — Patient Instructions (Signed)

## 2019-06-11 NOTE — Progress Notes (Signed)
Per Dr. Alvy Bimler, pt is Ok to treat with today's vitals

## 2019-06-11 NOTE — Progress Notes (Signed)
[facility] OFFICE PROGRESS NOTE  Patient Care Team: Larene Beach, MD as PCP - General (Family Medicine) Awanda Mink Craige Cotta, RN as Oncology Nurse Navigator (Oncology)  ASSESSMENT & PLAN:  Peritoneal carcinomatosis [facility]) Clinically, she have responded well to treatment Her last cycle of treatment was complicated by some diarrhea which resolved subsequently She developed significant neuropathy in her hand rather than her feet I suspect the neuropathy was related to previous injury to her hand from neuromas We will proceed with treatment without delay or dose adjustment I plan to order CT imaging next week for objective assessment of response to therapy If she have good response to treatment, she will proceed with interval debulking surgery  Cancer associated pain Her abdominal pain has resolved She have oxycodone to take as needed  Peripheral neuropathy due to chemotherapy [facility]) She has only grade 1 neuropathy in the feet The pain in her hand is something else She has purchased a compression sleeve I recommend a trial of gabapentin and she agreed We will proceed with similar dose of chemo without adjustment I discussed the risk and side effects of gabapentin with her   Orders Placed This Encounter  Procedures  . CT CHEST W CONTRAST    Standing Status:   Future    Standing Expiration Date:   06/10/2020    Order Specific Question:   If indicated for the ordered procedure, I authorize the administration of contrast media per Radiology protocol    Answer:   Yes    Order Specific Question:   Preferred imaging location?    Answer:   [facility]    Order Specific Question:   Radiology Contrast Protocol - do NOT remove file path    Answer:   \\[email or web address].[email or web address]  . CT ABDOMEN PELVIS W CONTRAST    Standing Status:   Future    Standing Expiration Date:   06/10/2020    Order Specific Question:   If indicated for the ordered procedure, I  authorize the administration of contrast media per Radiology protocol    Answer:   Yes    Order Specific Question:   Preferred imaging location?    Answer:   [facility]    Order Specific Question:   Radiology Contrast Protocol - do NOT remove file path    Answer:   \\[email or web address]\[email or web address]\CTProtocols.[email or web address]    All questions were answered. The patient knows to call the clinic with any problems, questions or concerns. The total time spent in the appointment was 20 minutes encounter with patients including review of chart and various tests results, discussions about plan of care and coordination of care plan   [name], MD 06/11/2019 11:50 AM  INTERVAL HISTORY: Please see below for problem oriented charting. She returns for cycle 3 of chemotherapy Overall, she feels well Her abdominal bloating/ascites has resolved She had one episode of diarrhea recently that has resolved Her appetite is fair She complained of severe neuropathy affecting her hand in the median nerve distribution but not in her feet No recent infection, fever or chills  SUMMARY OF ONCOLOGIC HISTORY: Oncology History  Peritoneal carcinomatosis ([place])  01/16/2019 Initial Diagnosis   She had vague symptom of dyspareunia. Presented to ER for evaluation in November for abdominal pain and vague symptom of fullness and imaging showed abnormalities   04/16/2019 Imaging   1. Nonvisualization of the appendix. However, there is a large volume of ascites within the abdomen and pelvis which appears partially loculated. In the acute setting  findings may be the sequelae of peritonitis. However, there are multiple partially calcified peritoneal nodules throughout the omentum which are concerning for peritoneal carcinomatosis. Noncalcified nodule in the left lower quadrant peritoneal reflection is noted. Further investigation with diagnostic paracentesis is advised. 2. Ill-defined low-density mass within the right iliac fossa is  suspected and may represent an ovarian neoplasm. 3. Aortic atherosclerosis. 4. Small bilateral pleural effusions. 5. Hiatal hernia. 6. Prior granulomatous disease.   Aortic Atherosclerosis (ICD10-I70.0).   04/17/2019 Tumor Marker   Patient's tumor was tested for the following markers: CA-125 Results of the tumor marker test revealed 288.   04/18/2019 Procedure   Successful ultrasound-guided diagnostic and therapeutic paracentesis yielding 3.2 liters of peritoneal fluid.   04/18/2019 Pathology Results   FINAL MICROSCOPIC DIAGNOSIS:  - Malignant cells present  - See comment   SPECIMEN ADEQUACY:  Satisfactory for evaluation   DIAGNOSTIC COMMENTS:  The cell block has scattered malignant cells.  These atypical cells are positive for cytokeraitn 7, MOC-31, Pax8, and WT-1 but negative for TTF-1 and cytokeratin 20.  Overall, the features are consistent with a primary gynecologic neoplasm   04/20/2019 Cancer Staging   Staging form: Ovary, Fallopian Tube, and Primary Peritoneal Carcinoma, AJCC 8th Edition - Clinical stage from 04/20/2019: FIGO Stage IIIC (cT3c, cN0, cM0) - Signed by [name], MD on 04/20/2019   04/25/2019 Procedure   Successful ultrasound-guided therapeutic paracentesis yielding 2.5 liters of peritoneal fluid.     04/27/2019 -  Chemotherapy   The patient had carboplatin and taxol for chemotherapy treatment.     05/17/2019 Procedure   Placement of a subcutaneous port device. Catheter tip at the SVC and right atrium junction.     REVIEW OF SYSTEMS:   Constitutional: Denies fevers, chills or abnormal weight loss Eyes: Denies blurriness of vision Ears, nose, mouth, throat, and face: Denies mucositis or sore throat Respiratory: Denies cough, dyspnea or wheezes Cardiovascular: Denies palpitation, chest discomfort or lower extremity swelling Skin: Denies abnormal skin rashes Lymphatics: Denies new lymphadenopathy or easy bruising Behavioral/Psych: Mood is stable, no new  changes  All other systems were reviewed with the patient and are negative.  I have reviewed the past medical history, past surgical history, social history and family history with the patient and they are unchanged from previous note.  ALLERGIES:  is allergic to bee venom; iodinated diagnostic agents; amlodipine; demerol [meperidine hcl]; lisinopril; losartan; other; and iodine.  MEDICATIONS:  Current Outpatient Medications  Medication Sig Dispense Refill  . aspirin EC 81 MG tablet Take 81 mg by mouth daily.    . Cholecalciferol (VITAMIN D-3 PO) Take 4,000 Int'l Units by mouth.    . dexamethasone (DECADRON) 4 MG tablet Take 1 tablet (4 mg total) by mouth 2 (two) times daily. 14 tablet 0  . esomeprazole (NEXIUM) 40 MG capsule Take 1 daily in the morning 30 capsule 3  . FLUoxetine (PROZAC) 20 MG capsule Take 1 capsule (20 mg total) by mouth daily. 90 capsule 3  . gabapentin (NEURONTIN) 300 MG capsule Take 1 capsule (300 mg total) by mouth 3 (three) times daily. 90 capsule 3  . lidocaine-prilocaine (EMLA) cream Apply to affected area once 30 g 3  . ondansetron (ZOFRAN-ODT) 8 MG disintegrating tablet Take 1 tablet (8 mg total) by mouth every 8 (eight) hours as needed for nausea or vomiting. 30 tablet 1  . oxyCODONE (OXY IR/ROXICODONE) 5 MG immediate release tablet Take 1 tablet (5 mg total) by mouth every 4 (four) hours as needed for  severe pain. 60 tablet 0  . prochlorperazine (COMPAZINE) 10 MG tablet Take 1 tablet (10 mg total) by mouth every 6 (six) hours as needed (Nausea or vomiting). 30 tablet 1  . simvastatin (ZOCOR) 40 MG tablet Take 1 tablet (40 mg total) by mouth at bedtime. 90 tablet 4   No current facility-administered medications for this visit.    PHYSICAL EXAMINATION: ECOG PERFORMANCE STATUS: 1 - Symptomatic but completely ambulatory  Vitals:   06/11/19 1118  BP: (!) 173/99  Pulse: (!) 116  Resp: 18  Temp: 97.9 F (36.6 C)  SpO2: 100%   Filed Weights   06/11/19 1118   Weight: 179 lb (81.2 kg)    GENERAL:alert, no distress and comfortable SKIN: skin color, texture, turgor are normal, no rashes or significant lesions EYES: normal, Conjunctiva are pink and non-injected, sclera clear OROPHARYNX:no exudate, no erythema and lips, buccal mucosa, and tongue normal  NECK: supple, thyroid normal size, non-tender, without nodularity LYMPH:  no palpable lymphadenopathy in the cervical, axillary or inguinal LUNGS: clear to auscultation and percussion with normal breathing effort HEART: regular rate & rhythm and no murmurs and no lower extremity edema ABDOMEN:abdomen soft, non-tender and normal bowel sounds Musculoskeletal:no cyanosis of digits and no clubbing  NEURO: alert & oriented x 3 with fluent speech, no focal motor/sensory deficits  LABORATORY DATA:  I have reviewed the data as listed    Component Value Date/Time   NA 136 05/17/2019 0821   NA 137 05/17/2019 0821   NA 137 10/27/2011 0738   K 4.4 05/17/2019 0821   K 4.4 05/17/2019 0821   K 4.1 10/27/2011 0738   CL 100 05/17/2019 0821   CL 104 05/17/2019 0821   CL 105 10/27/2011 0738   CO2 24 05/17/2019 0821   CO2 22 05/17/2019 0821   CO2 26 10/27/2011 0738   GLUCOSE 100 (H) 05/17/2019 0821   GLUCOSE 95 05/17/2019 0821   GLUCOSE 111 (H) 10/27/2011 0738   BUN 14 05/17/2019 0821   BUN 14 05/17/2019 0821   BUN 15 10/27/2011 0738   CREATININE 0.80 05/17/2019 0821   CREATININE 0.77 05/17/2019 0821   CREATININE 1.01 (H) 12/16/2015 1030   CALCIUM 9.6 05/17/2019 0821   CALCIUM 9.6 05/17/2019 0821   CALCIUM 10.0 09/28/2013 0000   PROT 7.6 05/17/2019 0821   PROT 8.1 10/27/2011 0738   ALBUMIN 3.7 05/17/2019 0821   ALBUMIN 3.9 10/27/2011 0738   AST 29 05/17/2019 0821   AST 21 10/27/2011 0738   ALT 28 05/17/2019 0821   ALT 29 10/27/2011 0738   ALKPHOS 104 05/17/2019 0821   ALKPHOS 102 10/27/2011 0738   BILITOT 0.5 05/17/2019 0821   BILITOT 0.3 10/27/2011 0738   GFRNONAA >60 05/17/2019 0821    GFRNONAA >60 05/17/2019 0821   GFRNONAA 59 (L) 12/16/2015 1030   GFRAA >60 05/17/2019 0821   GFRAA >60 05/17/2019 0821   GFRAA 68 12/16/2015 1030    No results found for: SPEP, UPEP  Lab Results  Component Value Date   WBC 10.8 (H) 06/11/2019   NEUTROABS 8.9 (H) 06/11/2019   HGB 11.2 (L) 06/11/2019   HCT 34.2 (L) 06/11/2019   MCV 91.4 06/11/2019   PLT 352 06/11/2019      Chemistry      Component Value Date/Time   NA 136 05/17/2019 0821   NA 137 05/17/2019 0821   NA 137 10/27/2011 0738   K 4.4 05/17/2019 0821   K 4.4 05/17/2019 0821   K 4.1 10/27/2011  0738   CL 100 05/17/2019 0821   CL 104 05/17/2019 0821   CL 105 10/27/2011 0738   CO2 24 05/17/2019 0821   CO2 22 05/17/2019 0821   CO2 26 10/27/2011 0738   BUN 14 05/17/2019 0821   BUN 14 05/17/2019 0821   BUN 15 10/27/2011 0738   CREATININE 0.80 05/17/2019 0821   CREATININE 0.77 05/17/2019 0821   CREATININE 1.01 (H) 12/16/2015 1030   GLU 98 09/28/2013 0000      Component Value Date/Time   CALCIUM 9.6 05/17/2019 0821   CALCIUM 9.6 05/17/2019 0821   CALCIUM 10.0 09/28/2013 0000   ALKPHOS 104 05/17/2019 0821   ALKPHOS 102 10/27/2011 0738   AST 29 05/17/2019 0821   AST 21 10/27/2011 0738   ALT 28 05/17/2019 0821   ALT 29 10/27/2011 0738   BILITOT 0.5 05/17/2019 0821   BILITOT 0.3 10/27/2011 0738       RADIOGRAPHIC STUDIES: I have personally reviewed the radiological images as listed and agreed with the findings in the report. IR IMAGING GUIDED PORT INSERTION  Result Date: 05/17/2019 INDICATION: 69 year old with peritoneal carcinomatosis. Port-A-Cath needed for treatment. EXAM: FLUOROSCOPIC AND ULTRASOUND GUIDED PLACEMENT OF A SUBCUTANEOUS PORT COMPARISON:  None. MEDICATIONS: Ancef 2 g; The antibiotic was administered within an appropriate time interval prior to skin puncture. ANESTHESIA/SEDATION: Versed 4.0 mg IV; Fentanyl 100 mcg IV; Moderate Sedation Time:  31 minutes The patient was continuously monitored  during the procedure by the interventional radiology nurse under my direct supervision. FLUOROSCOPY TIME:  36 seconds, 4 mGy COMPLICATIONS: None immediate. PROCEDURE: The procedure, risks, benefits, and alternatives were explained to the patient. Questions regarding the procedure were encouraged and answered. The patient understands and consents to the procedure. Patient was placed supine on the interventional table. Ultrasound confirmed a patent right internal jugular vein. Ultrasound image was saved for documentation. The right chest and neck were cleaned with a skin antiseptic and a sterile drape was placed. Maximal barrier sterile technique was utilized including caps, mask, sterile gowns, sterile gloves, sterile drape, hand hygiene and skin antiseptic. The right neck was anesthetized with 1% lidocaine. Small incision was made in the right neck with a blade. Micropuncture set was placed in the right internal jugular vein with ultrasound guidance. The micropuncture wire was used for measurement purposes. The right chest was anesthetized with 1% lidocaine with epinephrine. #15 blade was used to make an incision and a subcutaneous port pocket was formed. Roselle was assembled. Subcutaneous tunnel was formed with a stiff tunneling device. The port catheter was brought through the subcutaneous tunnel. The port was placed in the subcutaneous pocket and sutured in place. The micropuncture set was exchanged for a peel-away sheath. The catheter was placed through the peel-away sheath and the tip was positioned at the SVC and right atrium junction. Catheter placement was confirmed with fluoroscopy. The port was accessed and flushed with heparinized saline. The port pocket was closed using two layers of absorbable sutures and Steri-Strips. The vein skin site was closed using a single layer of absorbable suture and Steri-Strips. Sterile dressings were applied. Patient tolerated the procedure well without an  immediate complication. Ultrasound and fluoroscopic images were taken and saved for this procedure. IMPRESSION: Placement of a subcutaneous port device. Catheter tip at the SVC and right atrium junction. Electronically Signed   By: Markus Daft M.D.   On: 05/17/2019 13:49

## 2019-06-11 NOTE — Patient Instructions (Signed)
   [facility] Discharge Instructions for Patients Receiving Chemotherapy  Today you received the following chemotherapy agents Taxol and Carboplatin   To help prevent nausea and vomiting after your treatment, we encourage you to take your nausea medication as directed.    If you develop nausea and vomiting that is not controlled by your nausea medication, call the clinic.   BELOW ARE SYMPTOMS THAT SHOULD BE REPORTED IMMEDIATELY:  *FEVER GREATER THAN 100.5 F  *CHILLS WITH OR WITHOUT FEVER  NAUSEA AND VOMITING THAT IS NOT CONTROLLED WITH YOUR NAUSEA MEDICATION  *UNUSUAL SHORTNESS OF BREATH  *UNUSUAL BRUISING OR BLEEDING  TENDERNESS IN MOUTH AND THROAT WITH OR WITHOUT PRESENCE OF ULCERS  *URINARY PROBLEMS  *BOWEL PROBLEMS  UNUSUAL RASH Items with * indicate a potential emergency and should be followed up as soon as possible.  Feel free to call the clinic should you have any questions or concerns. The clinic phone number is [phone].  Please show the CHEMO ALERT CARD at check-in to the Emergency Department and triage nurse.   

## 2019-06-12 ENCOUNTER — Telehealth: Payer: Self-pay

## 2019-06-12 LAB — CA 125: Cancer Antigen (CA) 125: 20.8 U/mL (ref 0.0–38.1)

## 2019-06-12 NOTE — Telephone Encounter (Signed)
She called back and left a message. She is excited about the CA 125 results. She slept well last night and her right hand feels much better after starting the Neurontin. She wanted to thank you for everything.  Just FYI

## 2019-06-12 NOTE — Telephone Encounter (Signed)
Called and left below message. Ask her to call the office for questions. ?

## 2019-06-12 NOTE — Telephone Encounter (Signed)
-----   Message from [name], MD sent at 06/12/2019  7:18 AM EST ----- Regarding: pls call and let her know CA-125 is now normal

## 2019-06-15 ENCOUNTER — Other Ambulatory Visit: Payer: Self-pay

## 2019-06-15 ENCOUNTER — Other Ambulatory Visit: Payer: Self-pay | Admitting: Hematology and Oncology

## 2019-06-15 ENCOUNTER — Telehealth: Payer: Self-pay

## 2019-06-15 MED ORDER — PREDNISONE 50 MG PO TABS: ORAL_TABLET | ORAL | 0 refills | Status: DC

## 2019-06-15 MED ORDER — PREDNISONE 50 MG PO TABS: ORAL_TABLET | ORAL | Status: DC

## 2019-06-15 NOTE — Telephone Encounter (Signed)
Called and given instructions on 13 hour prep prior to scan on Monday due to history IVP dye. Rx sent to pharmacy with instructions. Instructed to take Benadryl 50 mg 1 hour prior to scan. She verbalized understanding.

## 2019-06-18 ENCOUNTER — Encounter (HOSPITAL_COMMUNITY): Payer: Self-pay

## 2019-06-18 ENCOUNTER — Ambulatory Visit (HOSPITAL_COMMUNITY)
Admission: RE | Admit: 2019-06-18 | Discharge: 2019-06-18 | Disposition: A | Discharge status: Home / Self Care | Payer: Medicare HMO | Source: Ambulatory Visit | Attending: Hematology and Oncology | Admitting: Hematology and Oncology

## 2019-06-18 ENCOUNTER — Other Ambulatory Visit: Payer: Self-pay

## 2019-06-18 DIAGNOSIS — C801 Malignant (primary) neoplasm, unspecified: Secondary | ICD-10-CM | POA: Diagnosis present

## 2019-06-18 DIAGNOSIS — C786 Secondary malignant neoplasm of retroperitoneum and peritoneum: Secondary | ICD-10-CM | POA: Insufficient documentation

## 2019-06-18 HISTORY — DX: Malignant (primary) neoplasm, unspecified: C80.1

## 2019-06-18 MED ORDER — SODIUM CHLORIDE (PF) 0.9 % IJ SOLN
INTRAMUSCULAR | Status: AC
  Filled 2019-06-18: qty 50

## 2019-06-18 MED ORDER — IOHEXOL 300 MG/ML  SOLN
100.0000 mL | Freq: Once | INTRAMUSCULAR | Status: AC | PRN
  Administered 2019-06-18: 100 mL via INTRAVENOUS

## 2019-06-19 ENCOUNTER — Encounter: Payer: Self-pay | Admitting: Gynecologic Oncology

## 2019-06-19 ENCOUNTER — Inpatient Hospital Stay: Payer: Medicare HMO | Attending: Gynecologic Oncology | Admitting: Gynecologic Oncology

## 2019-06-19 ENCOUNTER — Other Ambulatory Visit: Payer: Self-pay

## 2019-06-19 VITALS — BP 134/78 | HR 96 | Temp 98.3°F | Resp 18 | Ht 67.0 in | Wt 181.4 lb

## 2019-06-19 DIAGNOSIS — R180 Malignant ascites: Secondary | ICD-10-CM | POA: Diagnosis not present

## 2019-06-19 DIAGNOSIS — Z800 Family history of malignant neoplasm of digestive organs: Secondary | ICD-10-CM | POA: Diagnosis not present

## 2019-06-19 DIAGNOSIS — E785 Hyperlipidemia, unspecified: Secondary | ICD-10-CM | POA: Insufficient documentation

## 2019-06-19 DIAGNOSIS — Z9221 Personal history of antineoplastic chemotherapy: Secondary | ICD-10-CM | POA: Insufficient documentation

## 2019-06-19 DIAGNOSIS — F329 Major depressive disorder, single episode, unspecified: Secondary | ICD-10-CM | POA: Insufficient documentation

## 2019-06-19 DIAGNOSIS — I10 Essential (primary) hypertension: Secondary | ICD-10-CM | POA: Insufficient documentation

## 2019-06-19 DIAGNOSIS — C801 Malignant (primary) neoplasm, unspecified: Secondary | ICD-10-CM | POA: Diagnosis present

## 2019-06-19 DIAGNOSIS — K219 Gastro-esophageal reflux disease without esophagitis: Secondary | ICD-10-CM | POA: Insufficient documentation

## 2019-06-19 DIAGNOSIS — Z7901 Long term (current) use of anticoagulants: Secondary | ICD-10-CM | POA: Diagnosis not present

## 2019-06-19 DIAGNOSIS — Z801 Family history of malignant neoplasm of trachea, bronchus and lung: Secondary | ICD-10-CM | POA: Diagnosis not present

## 2019-06-19 DIAGNOSIS — Z79899 Other long term (current) drug therapy: Secondary | ICD-10-CM | POA: Insufficient documentation

## 2019-06-19 DIAGNOSIS — C786 Secondary malignant neoplasm of retroperitoneum and peritoneum: Secondary | ICD-10-CM | POA: Insufficient documentation

## 2019-06-19 MED ORDER — SENNOSIDES-DOCUSATE SODIUM 8.6-50 MG PO TABS: 2.0000 | ORAL_TABLET | Freq: Every day | ORAL | 1 refills | Status: DC

## 2019-06-19 MED ORDER — IBUPROFEN 600 MG PO TABS: 600.0000 mg | ORAL_TABLET | Freq: Four times a day (QID) | ORAL | 0 refills | Status: DC | PRN

## 2019-06-19 MED ORDER — OXYCODONE HCL 5 MG PO TABS: 5.0000 mg | ORAL_TABLET | ORAL | 0 refills | Status: DC | PRN

## 2019-06-19 MED ORDER — ENOXAPARIN SODIUM 40 MG/0.4ML ~~LOC~~ SOLN: 40.0000 mg | SUBCUTANEOUS | 0 refills | Status: DC

## 2019-06-19 MED FILL — STOOL SOFTENER-LAXATIVE TAB: 50-8.6 | 30 days supply | Qty: 30 | Fill #0

## 2019-06-19 MED FILL — oxyCODONE HCL 5 MG TABS: 5 | 3 days supply | Qty: 15 | Fill #0

## 2019-06-19 MED FILL — IBUPROFEN 600 MG TABLET: 600 | 7 days supply | Qty: 30 | Fill #0

## 2019-06-19 MED FILL — ENOXAPARIN SODIUM 40 MG/0.4: 40 | 14 days supply | Qty: 6 | Fill #0

## 2019-06-19 NOTE — Progress Notes (Signed)
New Patient Note: Gyn-Onc  CC:  Chief Complaint  Patient presents with  . Peritoneal carcinomatosis [facility])    Assessment/Plan:  [name]  is a 69 y.o.  year old with apparent stage 3 ovarian cancer, s/p 3 cycles neoadjuvant chemotherapy (day 1 of cycle 3 on 06/11/19).   Very good partial response on CA 125 and CT assessment.  I recommend interval cytoreduction.  Based on the physical exam findings and her CT imaging findings I think she is a good candidate for a minimally invasive approach with robotic assistance.  She may require a mini laparotomy for omentectomy.  Explained surgical risks including  bleeding, infection, damage to internal organs (such as bladder,ureters, bowels), blood clot, reoperation and rehospitalization. I explained goals of surgery including optimal cytoreduction.  She understands that surgery is not able to eradicate metastatic cancer but rather reduces tumor volume and burden such that chemotherapy is more successful.  She understands that she will require minimum of 3 additional cycles of chemotherapy after completion of surgery.  We have surgery scheduled for approximately 3 weeks after day 1 of cycle 3.  She will be on Lovenox for 2 weeks postoperatively as prophylaxis.  HPI:  [name] is a 69 year old P0 who was seen as a referral from the [facility] ED for ascites and a right pelvic mass with peritoneal nodularity.   Saw a gynecologist in [place] for dyspareunia in September, 2020. She reported having a normal examination.  In mid November, 2020 she began feeling unwell with "fevers" (none >100.3) and malaise and intractable cough. She was tested for COVID on November 21st, 2020.  Her last fever was on 100.3 and was approximately the 21st November. She took anti-tussives, augmentin and this improved her respiratory symptoms. However on 04/12/19 she began experiencing abdominal fullness and abdominal pains in the right lower quadrant.    She reported having leakage of stools but no urinary abnormalities.   Her past medical history is significant for hypercholesterolism, history of endometriosis, depression. Her past surgical history is significant for tonsillectomy, numerous laparoscopies for endometriosis as a younger woman, hysterectomy ("partial") in 08-14-86, laparoscopic cholecystectomy in August 14, 1998, in Aug 13, 2009 she had a parathyroidectomy, right hip replacement 2016/08/13.  She has never been pregnant.   Her family history is significant for a mother with lung cancer in 08-14-82 and her father died from gastric cancer, both were smokers.   She lives with her husband who is of good health. 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   Interval Hx:   Ca1 25 was drawn on April 17, 2019 and was elevated at 288.  She underwent paracentesis for 3 L of peritoneal fluid on April 18, 2019 which revealed scattered malignant cells with immunostains consistent for a gynecologic primary, positive for cytokeratin 7, PAX 8, and WT 1.  She commenced chemotherapy on April 27, 2019 with carboplatin paclitaxel.  She received dosing every 21 days with day 1 of cycle 3 administered on June 11, 2019.  On day 1 of cycle 3 her CA-125 had decreased to 20.8.  She was tolerating chemotherapy extremely well and had minimal toxicities.  Follow-up CT scan of the abdomen and pelvis performed on 31st 2021 revealed good partial response to neoadjuvant chemotherapy with a reduction in the size of a complex cystic right adnexal mass to 6.6 cm, omental nodularity had largely resolved.  The ascites had resolved and there was no adenopathy present.  There were no metastatic disease in the chest identified.  Current Meds:  Outpatient Encounter Medications as of 06/19/2019  Medication Sig  . Cholecalciferol (VITAMIN D-3 PO) Take 4,000 Units by mouth daily.   Marland Kitchen  esomeprazole (NEXIUM) 40 MG capsule Take 1 daily in the morning (Patient taking differently: Take 40 mg by mouth every morning. )  . FLUoxetine (PROZAC) 20 MG capsule Take 1 capsule (20 mg total) by mouth daily.  Marland Kitchen gabapentin (NEURONTIN) 300 MG capsule Take 1 capsule (300 mg total) by mouth 3 (three) times daily. (Patient taking differently: Take 300 mg by mouth at bedtime. )  . lansoprazole (PREVACID) 15 MG capsule Take 15 mg by mouth at bedtime.  . lidocaine-prilocaine (EMLA) cream Apply to affected area once (Patient taking differently: Apply 1 application topically daily as needed (port access). )  . simvastatin (ZOCOR) 40 MG tablet Take 1 tablet (40 mg total) by mouth at bedtime.  Marland Kitchen dexamethasone (DECADRON) 4 MG tablet Take 1 tablet (4 mg total) by mouth 2 (two) times daily. (Patient not taking: Reported on 06/19/2019)  . [START ON 07/04/2019] enoxaparin (LOVENOX) 40 MG/0.4ML injection Inject 0.4 mLs (40 mg total) into the skin daily for 14 doses. For AFTER surgery only  . ibuprofen (ADVIL) 600 MG tablet Take 1 tablet (600 mg total) by mouth every 6 (six) hours as needed for moderate pain. For AFTER surgery only  . ondansetron (ZOFRAN-ODT) 8 MG disintegrating tablet Take 1 tablet (8 mg total) by mouth every 8 (eight) hours as needed for nausea or vomiting. (Patient not taking: Reported on 06/19/2019)  . oxyCODONE (OXY IR/ROXICODONE) 5 MG immediate release tablet Take 1 tablet (5 mg total) by mouth every 4 (four) hours as needed for severe pain. (Patient not taking: Reported on 06/15/2019)  . oxyCODONE (OXY IR/ROXICODONE) 5 MG immediate release tablet Take 1 tablet (5 mg total) by mouth every 4 (four) hours as needed for severe pain. For AFTER surgery only, do not take and drive  . predniSONE (DELTASONE) 50 MG tablet Take 1 tablet 13 hours prior to scan, 1 tablet 7 hours prior to scan and 1 tablet 1 hour prior to scan. (Patient not taking: Reported on 06/19/2019)  . prochlorperazine (COMPAZINE) 10 MG tablet  Take 1 tablet (10 mg total) by mouth every 6 (six) hours as needed (Nausea or vomiting). (Patient not taking: Reported on 06/19/2019)  . senna-docusate (SENOKOT-S) 8.6-50 MG tablet Take 2 tablets by mouth at bedtime. For AFTER surgery, do not take if having diarrhea  . [DISCONTINUED] aspirin EC 81 MG tablet Take 81 mg by mouth daily.   No facility-administered encounter medications on file as of 06/19/2019.    Allergy:  Allergies  Allergen Reactions  . Bee Venom Anaphylaxis  . Iodinated Diagnostic Agents Anaphylaxis    Other reaction(s): Other (See Comments) Other Reaction: CNS Disorder  . Amlodipine Swelling    also ineffective  . Demerol [Meperidine Hcl] Nausea And Vomiting       . Lisinopril Cough  . Losartan     Bladder pain  . Iodine Other (See Comments)    Felt hot and flushed about 50 years ago after getting IVP dye.  No hives, no hypotension no sob. Likely normal reaction, given pretreatment protocol prior to discussion with patient of actual reaction.  NO reaction on CT  . Other Rash    Dermabond Surgical glue- rash, whelps    Social Hx:  Social History   Socioeconomic History  . Marital status: Married    Spouse name: Not on file  . Number of children: Not on file  . Years of education: Not on file  . Highest education level: Not on file  Occupational History  . 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 Smoking status: Never Smoker  . Smokeless tobacco: Never Used  Substance and Sexual Activity  . Alcohol use: Yes    Comment: Occasional  . Drug use: No  . Sexual activity: Not Currently  Other Topics Concern  . Not on file  Social History Narrative   Married   Gets regular exercise   Social Determinants of Health   Financial Resource Strain:   . Difficulty of Paying Living Expenses: Not on file  Food Insecurity:   . Worried About Charity fundraiser in the Last Year: Not on file  . Ran  Out of Food in the Last Year: Not on file  Transportation Needs:   . Lack of Transportation (Medical): Not on file  . Lack of Transportation (Non-Medical): Not on file  Physical Activity:   . Days of Exercise per Week: Not on file  . Minutes of Exercise per Session: Not on file  Stress:   . Feeling of Stress : Not on file  Social Connections:   . Frequency of Communication with Friends and Family: Not on file  . Frequency of Social Gatherings with Friends and Family: Not on file  . Attends Religious Services: Not on file  . Active Member of Clubs or Organizations: Not on file  . Attends Archivist Meetings: Not on file  . Marital Status: Not on file  Intimate Partner Violence:   . Fear of Current or Ex-Partner: Not on file  . Emotionally Abused: Not on file  . Physically Abused: Not on file  . Sexually Abused: Not on file    Past Surgical Hx:  Past Surgical History:  Procedure Laterality Date  . IR IMAGING GUIDED PORT INSERTION  05/17/2019  . JOINT REPLACEMENT     right total hip   . LAPAROSCOPIC CHOLECYSTECTOMY    . PARACENTESIS    . PARTIAL HYSTERECTOMY    . TONSILLECTOMY AND ADENOIDECTOMY      Past Medical Hx:  Past Medical History:  Diagnosis Date  . [id number] dx'd 03/2019  . Depression   . Dyspnea    with increased exertion   . Family history of lung cancer   . Family history of stomach cancer   . Fatigue   . GERD (gastroesophageal reflux disease)   . Hyperlipidemia    H/o myalgias with Vytorin but tolerating simvastatin  . Hypertension   . Myalgia    Hx of   . S/P abdominal paracentesis 04/25/2019   pulled off 2.5 liters  . S/P cholecystectomy   . S/P hysterectomy   . Shingles     Past Gynecological History:  Endometriosis, PCOS, P0, infertility (primary). No LMP recorded. Patient has had a hysterectomy.  Family Hx:  Family History  Problem Relation Age of Onset  . Lung cancer Mother   . Cancer Father 29       Stomach  . Heart failure  Maternal Grandmother 80       CHF  . Heart failure Maternal Grandfather 50       Acute MI  . Cancer Paternal Aunt  either colon or liver    Review of Systems:  Constitutional  Feels fatigued, malaise  ENT Normal appearing ears and nares bilaterally Skin/Breast  No rash, sores, jaundice, itching, dryness Cardiovascular  No chest pain, shortness of breath, or edema  Pulmonary  No cough or wheeze.  Gastro Intestinal  + loose stools.  Genito Urinary  No frequency, urgency, dysuria,  Musculo Skeletal  No myalgia, arthralgia, joint swelling or pain  Neurologic  No weakness, numbness, change in gait,  Psychology  No depression, anxiety, insomnia.   Vitals:  Blood pressure 134/78, pulse 96, temperature 98.3 F (36.8 C), temperature source Temporal, resp. rate 18, height 5\' 7"  (1.702 m), weight 181 lb 6.4 oz (82.3 kg), SpO2 100 %.  Physical Exam: WD in NAD Neck  Supple NROM, without any enlargements.  Lymph Node Survey No cervical supraclavicular or inguinal adenopathy Cardiovascular  Pulse normal rate, regularity and rhythm. S1 and S2 normal.  Lungs  Clear to auscultation bilateraly, without wheezes/crackles/rhonchi. Good air movement.  Skin  No rash/lesions/breakdown  Psychiatry  Alert and oriented to person, place, and time  Abdomen  Normoactive bowel sounds, abdomen soft, non-tender and obese without evidence of hernia. No palpable masses. Back No CVA tenderness Genito Urinary  Vulva/vagina: Normal external female genitalia.  No lesions. No discharge or bleeding.  Bladder/urethra:  No lesions or masses, well supported bladder  Vagina: normal, smooth, no lesions, no masses  Cervix and uterus surgically absent  Adnexa: . Fullness at the cuff appreciated. Rectal  Good tone, no masses no cul de sac nodularity.  Extremities  No bilateral cyanosis, clubbing or edema.   Thereasa Solo, MD  06/19/2019, 5:10 PM

## 2019-06-19 NOTE — Patient Instructions (Addendum)
DUE TO COVID-19 ONLY ONE VISITOR IS ALLOWED TO COME WITH YOU AND STAY IN THE WAITING ROOM ONLY DURING PRE OP AND PROCEDURE DAY OF SURGERY. THE 1 VISITOR MAY VISIT WITH YOU AFTER SURGERY IN YOUR PRIVATE ROOM DURING VISITING HOURS ONLY!  YOU NEED TO HAVE A COVID 19 TEST ON: 06/29/19 @ 10:00 am, THIS TEST MUST BE DONE BEFORE SURGERY, COME  [place], [place] , [id number].  ([place]) ONCE YOUR COVID TEST IS COMPLETED, PLEASE BEGIN THE QUARANTINE INSTRUCTIONS AS OUTLINED IN YOUR HANDOUT.                Connie Heath     Your procedure is scheduled on: 07-03-19   Report to [facility] Main  Entrance    Report to [place] at 5:30 AM     Call this number if you have problems the morning of surgery [phone]   Eat a light diet the day before surgery.  Examples including soups, broths, toast, yogurt, mashed potatoes.  Things to avoid include carbonated beverages (fizzy beverages), raw fruits and raw vegetables, or beans. If your bowels are filled with gas, your surgeon will have difficulty visualizing your pelvic organs which increases your surgical risks.    Remember: Do not eat food or drink liquids :After Midnight, you may have a Clear Liquid Diet until 4:30 AM, per your surgeon's orders   CLEAR LIQUID DIET   Foods Allowed                                                                     Foods Excluded  Coffee and tea, regular and decaf                             liquids that you cannot  Plain Jell-O any favor except red or purple                                           see through such as: Fruit ices (not with fruit pulp)                                     milk, soups, orange juice  Iced Popsicles                                    All solid food Carbonated beverages, regular and diet                                    Cranberry, grape and apple juices Sports drinks like Gatorade Lightly seasoned clear broth or consume(fat free) Sugar,  honey syrup  Sample Menu Breakfast                                Lunch  Supper Cranberry juice                    Beef broth                            Chicken broth Jell-O                                     Grape juice                           Apple juice Coffee or tea                        Jell-O                                      Popsicle                                                Coffee or tea                        Coffee or tea  _____________________________________________________________________  DRINK 2 PRESURGERY ENSURE DRINKS THE NIGHT BEFORE SURGERY AT  1000 PM AND 1 PRESURGERY DRINK THE DAY OF THE PROCEDURE 3 HOURS PRIOR TO SCHEDULED SURGERY. NO SOLIDS AFTER MIDNIGHT THE DAY PRIOR TO THE SURGERY. NOTHING BY MOUTH EXCEPT CLEAR LIQUIDS UNTIL THREE HOURS PRIOR TO SCHEDULED SURGERY. PLEASE FINISH PRESURGERY ENSURE DRINK PER SURGEON ORDER 3 HOURS PRIOR TO SCHEDULED SURGERY TIME WHICH NEEDS TO BE COMPLETED AT: 4:30 AM.       Take these medicines the morning of surgery with A SIP OF WATER: Esomeprazole (Nexium), Fluoxetine (Prozac), and Gabapentin (Neurontin)  BRUSH YOUR TEETH MORNING OF SURGERY AND RINSE YOUR MOUTH OUT, NO CHEWING GUM CANDY OR MINTS.                                 You may not have any metal on your body including hair pins and              piercings  Do not wear jewelry, make-up, lotions, powders or perfumes, deodorant             Do not wear nail polish on your fingernails.  Do not shave  48 hours prior to surgery.                  Do not bring valuables to the hospital. [place].  Contacts, dentures or bridgework may not be worn into surgery.  Leave suitcase in the car. After surgery it may be brought to your room.     Patients discharged the day of surgery will not be allowed to drive home. IF YOU ARE HAVING SURGERY AND GOING HOME THE SAME DAY, YOU MUST HAVE AN  ADULT TO DRIVE YOU HOME AND BE WITH YOU FOR 24 HOURS. YOU MAY GO  HOME BY TAXI OR UBER OR ORTHERWISE, BUT AN ADULT MUST ACCOMPANY YOU HOME AND STAY WITH YOU FOR 24 HOURS.  Name and phone number of your driver:  Special Instructions: N/A              Please read over the following fact sheets you were given: _____________________________________________________________________             [facility] - Preparing for Surgery Before surgery, you can play an important role.  Because skin is not sterile, your skin needs to be as free of germs as possible.  You can reduce the number of germs on your skin by washing with CHG (chlorahexidine gluconate) soap before surgery.  CHG is an antiseptic cleaner which kills germs and bonds with the skin to continue killing germs even after washing. Please DO NOT use if you have an allergy to CHG or antibacterial soaps.  If your skin becomes reddened/irritated stop using the CHG and inform your nurse when you arrive at Short Stay. Do not shave (including legs and underarms) for at least 48 hours prior to the first CHG shower.  You may shave your face/neck. Please follow these instructions carefully:  1.  Shower with CHG Soap the night before surgery and the  morning of Surgery.  2.  If you choose to wash your hair, wash your hair first as usual with your  normal  shampoo.  3.  After you shampoo, rinse your hair and body thoroughly to remove the  shampoo.                           4.  Use CHG as you would any other liquid soap.  You can apply chg directly  to the skin and wash                       Gently with a scrungie or clean washcloth.  5.  Apply the CHG Soap to your body ONLY FROM THE NECK DOWN.   Do not use on face/ open                           Wound or open sores. Avoid contact with eyes, ears mouth and genitals (private parts).                       Wash face,  Genitals (private parts) with your normal soap.             6.  Wash thoroughly, paying  special attention to the area where your surgery  will be performed.  7.  Thoroughly rinse your body with warm water from the neck down.  8.  DO NOT shower/wash with your normal soap after using and rinsing off  the CHG Soap.                9.  Pat yourself dry with a clean towel.            10.  Wear clean pajamas.            11.  Place clean sheets on your bed the night of your first shower and do not  sleep with pets. Day of Surgery : Do not apply any lotions/deodorants the morning of surgery.  Please wear clean clothes to the hospital/surgery center.  FAILURE TO FOLLOW THESE INSTRUCTIONS MAY RESULT IN THE  CANCELLATION OF YOUR SURGERY PATIENT SIGNATURE_________________________________  NURSE SIGNATURE__________________________________  ________________________________________________________________________   Adam Phenix  An incentive spirometer is a tool that can help keep your lungs clear and active. This tool measures how well you are filling your lungs with each breath. Taking long deep breaths may help reverse or decrease the chance of developing breathing (pulmonary) problems (especially infection) following:  A long period of time when you are unable to move or be active. BEFORE THE PROCEDURE   If the spirometer includes an indicator to show your best effort, your nurse or respiratory therapist will set it to a desired goal.  If possible, sit up straight or lean slightly forward. Try not to slouch.  Hold the incentive spirometer in an upright position. INSTRUCTIONS FOR USE  1. Sit on the edge of your bed if possible, or sit up as far as you can in bed or on a chair. 2. Hold the incentive spirometer in an upright position. 3. Breathe out normally. 4. Place the mouthpiece in your mouth and seal your lips tightly around it. 5. Breathe in slowly and as deeply as possible, raising the piston or the ball toward the top of the column. 6. Hold your breath for 3-5 seconds  or for as long as possible. Allow the piston or ball to fall to the bottom of the column. 7. Remove the mouthpiece from your mouth and breathe out normally. 8. Rest for a few seconds and repeat Steps 1 through 7 at least 10 times every 1-2 hours when you are awake. Take your time and take a few normal breaths between deep breaths. 9. The spirometer may include an indicator to show your best effort. Use the indicator as a goal to work toward during each repetition. 10. After each set of 10 deep breaths, practice coughing to be sure your lungs are clear. If you have an incision (the cut made at the time of surgery), support your incision when coughing by placing a pillow or rolled up towels firmly against it. Once you are able to get out of bed, walk around indoors and cough well. You may stop using the incentive spirometer when instructed by your caregiver.  RISKS AND COMPLICATIONS  Take your time so you do not get dizzy or light-headed.  If you are in pain, you may need to take or ask for pain medication before doing incentive spirometry. It is harder to take a deep breath if you are having pain. AFTER USE  Rest and breathe slowly and easily.  It can be helpful to keep track of a log of your progress. Your caregiver can provide you with a simple table to help with this. If you are using the spirometer at home, follow these instructions: [facility] IF:   You are having difficultly using the spirometer.  You have trouble using the spirometer as often as instructed.  Your pain medication is not giving enough relief while using the spirometer.  You develop fever of 100.5 F (38.1 C) or higher. SEEK IMMEDIATE MEDICAL CARE IF:   You cough up bloody sputum that had not been present before.  You develop fever of 102 F (38.9 C) or greater.  You develop worsening pain at or near the incision site. MAKE SURE YOU:   Understand these instructions.  Will watch your condition.  Will  get help right away if you are not doing well or get worse. Document Released: 09/13/2006 Document Revised: 07/26/2011 Document Reviewed: 11/14/2006 [employer] Patient Information  2014 [employer].   ________________________________________________________________________  WHAT IS A BLOOD TRANSFUSION? Blood Transfusion Information  A transfusion is the replacement of blood or some of its parts. Blood is made up of multiple cells which provide different functions.  Red blood cells carry oxygen and are used for blood loss replacement.  White blood cells fight against infection.  Platelets control bleeding.  Plasma helps clot blood.  Other blood products are available for specialized needs, such as hemophilia or other clotting disorders. BEFORE THE TRANSFUSION  Who gives blood for transfusions?   Healthy volunteers who are fully evaluated to make sure their blood is safe. This is blood bank blood. Transfusion therapy is the safest it has ever been in the practice of medicine. Before blood is taken from a donor, a complete history is taken to make sure that person has no history of diseases nor engages in risky social behavior (examples are intravenous drug use or sexual activity with multiple partners). The donor's travel history is screened to minimize risk of transmitting infections, such as malaria. The donated blood is tested for signs of infectious diseases, such as HIV and hepatitis. The blood is then tested to be sure it is compatible with you in order to minimize the chance of a transfusion reaction. If you or a relative donates blood, this is often done in anticipation of surgery and is not appropriate for emergency situations. It takes many days to process the donated blood. RISKS AND COMPLICATIONS Although transfusion therapy is very safe and saves many lives, the main dangers of transfusion include:   Getting an infectious disease.  Developing a transfusion reaction. This is  an allergic reaction to something in the blood you were given. Every precaution is taken to prevent this. The decision to have a blood transfusion has been considered carefully by your caregiver before blood is given. Blood is not given unless the benefits outweigh the risks. AFTER THE TRANSFUSION  Right after receiving a blood transfusion, you will usually feel much better and more energetic. This is especially true if your red blood cells have gotten low (anemic). The transfusion raises the level of the red blood cells which carry oxygen, and this usually causes an energy increase.  The nurse administering the transfusion will monitor you carefully for complications. HOME CARE INSTRUCTIONS  No special instructions are needed after a transfusion. You may find your energy is better. Speak with your caregiver about any limitations on activity for underlying diseases you may have. SEEK MEDICAL CARE IF:   Your condition is not improving after your transfusion.  You develop redness or irritation at the intravenous (IV) site. SEEK IMMEDIATE MEDICAL CARE IF:  Any of the following symptoms occur over the next 12 hours:  Shaking chills.  You have a temperature by mouth above 102 F (38.9 C), not controlled by medicine.  Chest, back, or muscle pain.  People around you feel you are not acting correctly or are confused.  Shortness of breath or difficulty breathing.  Dizziness and fainting.  You get a rash or develop hives.  You have a decrease in urine output.  Your urine turns a dark color or changes to pink, red, or brown. Any of the following symptoms occur over the next 10 days:  You have a temperature by mouth above 102 F (38.9 C), not controlled by medicine.  Shortness of breath.  Weakness after normal activity.  The white part of the eye turns yellow (jaundice).  You have a decrease in  the amount of urine or are urinating less often.  Your urine turns a dark color or  changes to pink, red, or brown. Document Released: 04/30/2000 Document Revised: 07/26/2011 Document Reviewed: 12/18/2007 [facility] Patient Information 2014 [place], [place].  _______________________________________________________________________

## 2019-06-19 NOTE — H&P (View-Only) (Signed)
New Patient Note: Gyn-Onc  CC:  Chief Complaint  Patient presents with  . Peritoneal carcinomatosis [facility])    Assessment/Plan:  Connie. Connie Heath  is a 69 y.o.  year old with apparent stage 3 ovarian cancer, s/p 3 cycles neoadjuvant chemotherapy (day 1 of cycle 3 on 06/11/19).   Very good partial response on CA 125 and CT assessment.  I recommend interval cytoreduction.  Based on the physical exam findings and her CT imaging findings I think she is a good candidate for a minimally invasive approach with robotic assistance.  She may require a mini laparotomy for omentectomy.  Explained surgical risks including  bleeding, infection, damage to internal organs (such as bladder,ureters, bowels), blood clot, reoperation and rehospitalization. I explained goals of surgery including optimal cytoreduction.  She understands that surgery is not able to eradicate metastatic cancer but rather reduces tumor volume and burden such that chemotherapy is more successful.  She understands that she will require minimum of 3 additional cycles of chemotherapy after completion of surgery.  We have surgery scheduled for approximately 3 weeks after day 1 of cycle 3.  She will be on Lovenox for 2 weeks postoperatively as prophylaxis.  HPI:  Connie Heath is a 69 year old P0 who was seen as a referral from the [facility] ED for ascites and a right pelvic mass with peritoneal nodularity.   Saw a gynecologist in [place] for dyspareunia in September, 2020. She reported having a normal examination.  In mid November, 2020 she began feeling unwell with "fevers" (none >100.3) and malaise and intractable cough. She was tested for COVID on November 21st, 2020.  Her last fever was on 100.3 and was approximately the 21st November. She took anti-tussives, augmentin and this improved her respiratory symptoms. However on 04/12/19 she began experiencing abdominal fullness and abdominal pains in the right lower quadrant.    She reported having leakage of stools but no urinary abnormalities.   Her past medical history is significant for hypercholesterolism, history of endometriosis, depression. Her past surgical history is significant for tonsillectomy, numerous laparoscopies for endometriosis as a younger woman, hysterectomy ("partial") in 08-31-1986, laparoscopic cholecystectomy in 1998/08/31, in Aug 30, 2009 she had a parathyroidectomy, right hip replacement 08/30/2016.  She has never been pregnant.   Her family history is significant for a mother with lung cancer in 1982-08-31 and her father died from gastric cancer, both were smokers.   She lives with her husband who is of good health. 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   Interval Hx:   Ca1 25 was drawn on April 17, 2019 and was elevated at 288.  She underwent paracentesis for 3 L of peritoneal fluid on April 18, 2019 which revealed scattered malignant cells with immunostains consistent for a gynecologic primary, positive for cytokeratin 7, PAX 8, and WT 1.  She commenced chemotherapy on April 27, 2019 with carboplatin paclitaxel.  She received dosing every 21 days with day 1 of cycle 3 administered on June 11, 2019.  On day 1 of cycle 3 her CA-125 had decreased to 20.8.  She was tolerating chemotherapy extremely well and had minimal toxicities.  Follow-up CT scan of the abdomen and pelvis performed on 31st 2021 revealed good partial response to neoadjuvant chemotherapy with a reduction in the size of a complex cystic right adnexal mass to 6.6 cm, omental nodularity had largely resolved.  The ascites had resolved and there was no adenopathy present.  There were no metastatic disease in the chest identified.  Current Meds:  Outpatient Encounter Medications as of 06/19/2019  Medication Sig  . Cholecalciferol (VITAMIN D-3 PO) Take 4,000 Units by mouth daily.   Marland Kitchen  esomeprazole (NEXIUM) 40 MG capsule Take 1 daily in the morning (Patient taking differently: Take 40 mg by mouth every morning. )  . FLUoxetine (PROZAC) 20 MG capsule Take 1 capsule (20 mg total) by mouth daily.  Marland Kitchen gabapentin (NEURONTIN) 300 MG capsule Take 1 capsule (300 mg total) by mouth 3 (three) times daily. (Patient taking differently: Take 300 mg by mouth at bedtime. )  . lansoprazole (PREVACID) 15 MG capsule Take 15 mg by mouth at bedtime.  . lidocaine-prilocaine (EMLA) cream Apply to affected area once (Patient taking differently: Apply 1 application topically daily as needed (port access). )  . simvastatin (ZOCOR) 40 MG tablet Take 1 tablet (40 mg total) by mouth at bedtime.  Marland Kitchen dexamethasone (DECADRON) 4 MG tablet Take 1 tablet (4 mg total) by mouth 2 (two) times daily. (Patient not taking: Reported on 06/19/2019)  . [START ON 07/04/2019] enoxaparin (LOVENOX) 40 MG/0.4ML injection Inject 0.4 mLs (40 mg total) into the skin daily for 14 doses. For AFTER surgery only  . ibuprofen (ADVIL) 600 MG tablet Take 1 tablet (600 mg total) by mouth every 6 (six) hours as needed for moderate pain. For AFTER surgery only  . ondansetron (ZOFRAN-ODT) 8 MG disintegrating tablet Take 1 tablet (8 mg total) by mouth every 8 (eight) hours as needed for nausea or vomiting. (Patient not taking: Reported on 06/19/2019)  . oxyCODONE (OXY IR/ROXICODONE) 5 MG immediate release tablet Take 1 tablet (5 mg total) by mouth every 4 (four) hours as needed for severe pain. (Patient not taking: Reported on 06/15/2019)  . oxyCODONE (OXY IR/ROXICODONE) 5 MG immediate release tablet Take 1 tablet (5 mg total) by mouth every 4 (four) hours as needed for severe pain. For AFTER surgery only, do not take and drive  . predniSONE (DELTASONE) 50 MG tablet Take 1 tablet 13 hours prior to scan, 1 tablet 7 hours prior to scan and 1 tablet 1 hour prior to scan. (Patient not taking: Reported on 06/19/2019)  . prochlorperazine (COMPAZINE) 10 MG tablet  Take 1 tablet (10 mg total) by mouth every 6 (six) hours as needed (Nausea or vomiting). (Patient not taking: Reported on 06/19/2019)  . senna-docusate (SENOKOT-S) 8.6-50 MG tablet Take 2 tablets by mouth at bedtime. For AFTER surgery, do not take if having diarrhea  . [DISCONTINUED] aspirin EC 81 MG tablet Take 81 mg by mouth daily.   No facility-administered encounter medications on file as of 06/19/2019.    Allergy:  Allergies  Allergen Reactions  . Bee Venom Anaphylaxis  . Iodinated Diagnostic Agents Anaphylaxis    Other reaction(s): Other (See Comments) Other Reaction: CNS Disorder  . Amlodipine Swelling    also ineffective  . Demerol [Meperidine Hcl] Nausea And Vomiting       . Lisinopril Cough  . Losartan     Bladder pain  . Iodine Other (See Comments)    Felt hot and flushed about 50 years ago after getting IVP dye.  No hives, no hypotension no sob. Likely normal reaction, given pretreatment protocol prior to discussion with patient of actual reaction.  NO reaction on CT  . Other Rash    Dermabond Surgical glue- rash, whelps    Social Hx:  Social History   Socioeconomic History  . Marital status: Married    Spouse name: Not on file  . Number of children: Not on file  . Years of education: Not on file  . Highest education level: Not on file  Occupational History  . 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 Smoking status: Never Smoker  . Smokeless tobacco: Never Used  Substance and Sexual Activity  . Alcohol use: Yes    Comment: Occasional  . Drug use: No  . Sexual activity: Not Currently  Other Topics Concern  . Not on file  Social History Narrative   Married   Gets regular exercise   Social Determinants of Health   Financial Resource Strain:   . Difficulty of Paying Living Expenses: Not on file  Food Insecurity:   . Worried About Charity fundraiser in the Last Year: Not on file  . Ran  Out of Food in the Last Year: Not on file  Transportation Needs:   . Lack of Transportation (Medical): Not on file  . Lack of Transportation (Non-Medical): Not on file  Physical Activity:   . Days of Exercise per Week: Not on file  . Minutes of Exercise per Session: Not on file  Stress:   . Feeling of Stress : Not on file  Social Connections:   . Frequency of Communication with Friends and Family: Not on file  . Frequency of Social Gatherings with Friends and Family: Not on file  . Attends Religious Services: Not on file  . Active Member of Clubs or Organizations: Not on file  . Attends Archivist Meetings: Not on file  . Marital Status: Not on file  Intimate Partner Violence:   . Fear of Current or Ex-Partner: Not on file  . Emotionally Abused: Not on file  . Physically Abused: Not on file  . Sexually Abused: Not on file    Past Surgical Hx:  Past Surgical History:  Procedure Laterality Date  . IR IMAGING GUIDED PORT INSERTION  05/17/2019  . JOINT REPLACEMENT     right total hip   . LAPAROSCOPIC CHOLECYSTECTOMY    . PARACENTESIS    . PARTIAL HYSTERECTOMY    . TONSILLECTOMY AND ADENOIDECTOMY      Past Medical Hx:  Past Medical History:  Diagnosis Date  . [id number] dx'd 03/2019  . Depression   . Dyspnea    with increased exertion   . Family history of lung cancer   . Family history of stomach cancer   . Fatigue   . GERD (gastroesophageal reflux disease)   . Hyperlipidemia    H/o myalgias with Vytorin but tolerating simvastatin  . Hypertension   . Myalgia    Hx of   . S/P abdominal paracentesis 04/25/2019   pulled off 2.5 liters  . S/P cholecystectomy   . S/P hysterectomy   . Shingles     Past Gynecological History:  Endometriosis, PCOS, P0, infertility (primary). No LMP recorded. Patient has had a hysterectomy.  Family Hx:  Family History  Problem Relation Age of Onset  . Lung cancer Mother   . Cancer Father 74       Stomach  . Heart failure  Maternal Grandmother 80       CHF  . Heart failure Maternal Grandfather 50       Acute MI  . Cancer Paternal Aunt  either colon or liver    Review of Systems:  Constitutional  Feels fatigued, malaise  ENT Normal appearing ears and nares bilaterally Skin/Breast  No rash, sores, jaundice, itching, dryness Cardiovascular  No chest pain, shortness of breath, or edema  Pulmonary  No cough or wheeze.  Gastro Intestinal  + loose stools.  Genito Urinary  No frequency, urgency, dysuria,  Musculo Skeletal  No myalgia, arthralgia, joint swelling or pain  Neurologic  No weakness, numbness, change in gait,  Psychology  No depression, anxiety, insomnia.   Vitals:  Blood pressure 134/78, pulse 96, temperature 98.3 F (36.8 C), temperature source Temporal, resp. rate 18, height 5\' 7"  (1.702 m), weight 181 lb 6.4 oz (82.3 kg), SpO2 100 %.  Physical Exam: WD in NAD Neck  Supple NROM, without any enlargements.  Lymph Node Survey No cervical supraclavicular or inguinal adenopathy Cardiovascular  Pulse normal rate, regularity and rhythm. S1 and S2 normal.  Lungs  Clear to auscultation bilateraly, without wheezes/crackles/rhonchi. Good air movement.  Skin  No rash/lesions/breakdown  Psychiatry  Alert and oriented to person, place, and time  Abdomen  Normoactive bowel sounds, abdomen soft, non-tender and obese without evidence of hernia. No palpable masses. Back No CVA tenderness Genito Urinary  Vulva/vagina: Normal external female genitalia.  No lesions. No discharge or bleeding.  Bladder/urethra:  No lesions or masses, well supported bladder  Vagina: normal, smooth, no lesions, no masses  Cervix and uterus surgically absent  Adnexa: . Fullness at the cuff appreciated. Rectal  Good tone, no masses no cul de sac nodularity.  Extremities  No bilateral cyanosis, clubbing or edema.   Thereasa Solo, MD  06/19/2019, 5:10 PM

## 2019-06-19 NOTE — Patient Instructions (Signed)
Preparing for your Surgery  Plan for surgery on July 03, 2019 with Dr. Everitt Amber at [facility] will be scheduled for a robotic assisted bilateral salpingo-oophorectomy, omentectomy, debulking, possible laparotomy.   Pre-operative Testing -You will receive a phone call from presurgical testing at [facility] to discuss instructions for surgery over the phone and to arrange for the COVID test prior to surgery.  -Bring your insurance card, copy of an advanced directive if applicable, medication list  -At that visit, you will be asked to sign a consent for a possible blood transfusion in case a transfusion becomes necessary during surgery.  The need for a blood transfusion is rare but having consent is a necessary part of your care.     -You should not be taking blood thinners or aspirin at least ten days prior to surgery unless instructed by your surgeon.  -Do not take supplements such as fish oil (omega 3), red yeast rice, tumeric before your surgery. If you need something for pain, we recommended using tylenol over ibuprofen before surgery.  Day Before Surgery at [place] will be asked to take in a light diet the day before surgery.  Avoid carbonated beverages.  You will be advised to have nothing to eat or drink after midnight the evening before.    Eat a light diet the day before surgery.  Examples including soups, broths, toast, yogurt, mashed potatoes.  Things to avoid include carbonated beverages (fizzy beverages), raw fruits and raw vegetables, or beans.   If your bowels are filled with gas, your surgeon will have difficulty visualizing your pelvic organs which increases your surgical risks.  Your role in recovery Your role is to become active as soon as directed by your doctor, while still giving yourself time to heal.  Rest when you feel tired. You will be asked to do the following in order to speed your recovery:  - Cough and breathe deeply. This helps  to clear and expand your lungs and can prevent pneumonia after surgery.  - [place]. Do mild physical activity. Walking or moving your legs help your circulation and body functions return to normal. Do not try to get up or walk alone the first time after surgery.   -If you develop swelling on one leg or the other, pain in the back of your leg, redness/warmth in one of your legs, please call the office or go to the Emergency Room to have a doppler to rule out a blood clot. For shortness of breath, chest pain-seek care in the Emergency Room as soon as possible. - Actively manage your pain. Managing your pain lets you move in comfort. We will ask you to rate your pain on a scale of zero to 10. It is your responsibility to tell your doctor or nurse where and how much you hurt so your pain can be treated.  Special Considerations -If you are diabetic, you may be placed on insulin after surgery to have closer control over your blood sugars to promote healing and recovery.  This does not mean that you will be discharged on insulin.  If applicable, your oral antidiabetics will be resumed when you are tolerating a solid diet.  -Your final pathology results from surgery should be available around one week after surgery and the results will be relayed to you when available.  -Dr. Lahoma Crocker is the surgeon that assists your GYN Oncologist with surgery.  If you end up  staying the night, the next day after your surgery you will either see Dr. Denman George, Dr. Berline Lopes, or Dr. Lahoma Crocker.  -FMLA forms can be faxed to [phone] and please allow 5-7 business days for completion.  Pain Management After Surgery -You have been prescribed your pain medication and bowel regimen medications before surgery so that you can have these available when you are discharged from the hospital. The pain medication is for use ONLY AFTER surgery and a new prescription will not be given.   -Make sure that  you have Tylenol and Ibuprofen at home to use on a regular basis after surgery for pain control. We recommend alternating the medications every hour to six hours since they work differently and are processed in the body differently for pain relief.  -Review the attached handout on narcotic use and their risks and side effects.   Bowel Regimen -You have been prescribed Sennakot-S to take nightly to prevent constipation especially if you are taking the narcotic pain medication intermittently.  It is important to prevent constipation and drink adequate amounts of liquids. You can stop taking this medication when you are not taking pain medication and you are back on your normal bowel routine.   Blood Transfusion Information (For the consent to be signed before surgery)  We will be checking your blood type before surgery so in case of emergencies, we will know what type of blood you would need.                                            WHAT IS A BLOOD TRANSFUSION?  A transfusion is the replacement of blood or some of its parts. Blood is made up of multiple cells which provide different functions.  Red blood cells carry oxygen and are used for blood loss replacement.  White blood cells fight against infection.  Platelets control bleeding.  Plasma helps clot blood.  Other blood products are available for specialized needs, such as hemophilia or other clotting disorders. BEFORE THE TRANSFUSION  Who gives blood for transfusions?   You may be able to donate blood to be used at a later date on yourself (autologous donation).  Relatives can be asked to donate blood. This is generally not any safer than if you have received blood from a stranger. The same precautions are taken to ensure safety when a relative's blood is donated.  Healthy volunteers who are fully evaluated to make sure their blood is safe. This is blood bank blood. Transfusion therapy is the safest it has ever been in the  practice of medicine. Before blood is taken from a donor, a complete history is taken to make sure that person has no history of diseases nor engages in risky social behavior (examples are intravenous drug use or sexual activity with multiple partners). The donor's travel history is screened to minimize risk of transmitting infections, such as malaria. The donated blood is tested for signs of infectious diseases, such as HIV and hepatitis. The blood is then tested to be sure it is compatible with you in order to minimize the chance of a transfusion reaction. If you or a relative donates blood, this is often done in anticipation of surgery and is not appropriate for emergency situations. It takes many days to process the donated blood. RISKS AND COMPLICATIONS Although transfusion therapy is very safe and saves many lives, the  main dangers of transfusion include:   Getting an infectious disease.  Developing a transfusion reaction. This is an allergic reaction to something in the blood you were given. Every precaution is taken to prevent this. The decision to have a blood transfusion has been considered carefully by your caregiver before blood is given. Blood is not given unless the benefits outweigh the risks.  AFTER SURGERY INSTRUCTIONS  Return to work: 4-6 weeks if applicable  Activity: 1. Be up and out of the bed during the day.  Take a nap if needed.  You may walk up steps but be careful and use the hand rail.  Stair climbing will tire you more than you think, you may need to stop part way and rest.   2. No lifting or straining for 6 weeks over 10 pounds. No pushing, pulling, straining for 6 weeks.  3. No driving for 1 week(s).  Do not drive if you are taking narcotic pain medicine.   4. You can shower as soon as the next day after surgery. Shower daily.  Use soap and water on your incision and pat dry; don't rub.  No tub baths or submerging your body in water until cleared by your surgeon. If  you have the soap that was given to you by pre-surgical testing that was used before surgery, you do not need to use it afterwards because this can irritate your incisions.   5. No sexual activity and nothing in the vagina for 4 weeks.  6. You may experience a small amount of clear drainage from your incisions, which is normal.  If the drainage persists, increases, or changes color please call the office.  7. Do not use creams, lotions, or ointments such as neosporin on your incisions after surgery until advised by your surgeon because they can cause removal of the dermabond glue on your incisions.    8. You may experience vaginal spotting after surgery or around the 6-8 week mark from surgery when the stitches at the top of the vagina begin to dissolve.  The spotting is normal but if you experience heavy bleeding, call our office.  9. Take Tylenol or ibuprofen first for pain and only use narcotic pain medication for severe pain not relieved by the Tylenol or Ibuprofen.  Monitor your Tylenol intake to a max of 4,000 mg.  Diet: 1. Low sodium Heart Healthy Diet is recommended.  2. It is safe to use a laxative, such as Miralax or Colace, if you have difficulty moving your bowels. You can take Sennakot at bedtime every evening to keep bowel movements regular and to prevent constipation.    Wound Care: 1. Keep clean and dry.  Shower daily.  Reasons to call the Doctor:  Fever - Oral temperature greater than 100.4 degrees Fahrenheit  Foul-smelling vaginal discharge  Difficulty urinating  Nausea and vomiting  Increased pain at the site of the incision that is unrelieved with pain medicine.  Difficulty breathing with or without chest pain  New calf pain especially if only on one side  Sudden, continuing increased vaginal bleeding with or without clots.   Contacts: For questions or concerns you should contact:  Dr. Everitt Amber at [phone]  [name], NP at  [phone]  After Hours: call [phone] and have the GYN Oncologist paged/contacted  Enoxaparin injection What is this medicine? ENOXAPARIN (ee nox a PA rin) is used after knee, hip, or abdominal surgeries to prevent blood clotting. It is also used to treat existing blood  clots in the lungs or in the veins. This medicine may be used for other purposes; ask your health care provider or pharmacist if you have questions. COMMON BRAND NAME(S): Lovenox What should I tell my health care provider before I take this medicine? They need to know if you have any of these conditions:  bleeding disorders, hemorrhage, or hemophilia  infection of the heart or heart valves  kidney or liver disease  previous stroke  prosthetic heart valve  recent surgery or delivery of a baby  ulcer in the stomach or intestine, diverticulitis, or other bowel disease  an unusual or allergic reaction to enoxaparin, heparin, pork or pork products, other medicines, foods, dyes, or preservatives  pregnant or trying to get pregnant  breast-feeding How should I use this medicine? This medicine is for injection under the skin. It is usually given by a health-care professional. You or a family member may be trained on how to give the injections. If you are to give yourself injections, make sure you understand how to use the syringe, measure the dose if necessary, and give the injection. To avoid bruising, do not rub the site where this medicine has been injected. Do not take your medicine more often than directed. Do not stop taking except on the advice of your doctor or health care professional. Make sure you receive a puncture-resistant container to dispose of the needles and syringes once you have finished with them. Do not reuse these items. Return the container to your doctor or health care professional for proper disposal. Talk to your pediatrician regarding the use of this medicine in children. Special care may  be needed. Overdosage: If you think you have taken too much of this medicine contact a poison control center or emergency room at once. NOTE: This medicine is only for you. Do not share this medicine with others. What if I miss a dose? If you miss a dose, take it as soon as you can. If it is almost time for your next dose, take only that dose. Do not take double or extra doses. What may interact with this medicine?  aspirin and aspirin-like medicines  certain medicines that treat or prevent blood clots  dipyridamole  NSAIDs, medicines for pain and inflammation, like ibuprofen or naproxen This list may not describe all possible interactions. Give your health care provider a list of all the medicines, herbs, non-prescription drugs, or dietary supplements you use. Also tell them if you smoke, drink alcohol, or use illegal drugs. Some items may interact with your medicine. What should I watch for while using this medicine? Visit your healthcare professional for regular checks on your progress. You may need blood work done while you are taking this medicine. Your condition will be monitored carefully while you are receiving this medicine. It is important not to miss any appointments. If you are going to need surgery or other procedure, tell your healthcare professional that you are using this medicine. Using this medicine for a long time may weaken your bones and increase the risk of bone fractures. Avoid sports and activities that might cause injury while you are using this medicine. Severe falls or injuries can cause unseen bleeding. Be careful when using sharp tools or knives. Consider using an Copy. Take special care brushing or flossing your teeth. Report any injuries, bruising, or red spots on the skin to your healthcare professional. Wear a medical ID bracelet or chain. Carry a card that describes your disease and details of  your medicine and dosage times. What side effects may I  notice from receiving this medicine? Side effects that you should report to your doctor or health care professional as soon as possible:  allergic reactions like skin rash, itching or hives, swelling of the face, lips, or tongue  bone pain  signs and symptoms of bleeding such as bloody or black, tarry stools; red or dark-brown urine; spitting up blood or brown material that looks like coffee grounds; red spots on the skin; unusual bruising or bleeding from the eye, gums, or nose  signs and symptoms of a blood clot such as chest pain; shortness of breath; pain, swelling, or warmth in the leg  signs and symptoms of a stroke such as changes in vision; confusion; trouble speaking or understanding; severe headaches; sudden numbness or weakness of the face, arm or leg; trouble walking; dizziness; loss of coordination Side effects that usually do not require medical attention (report to your doctor or health care professional if they continue or are bothersome):  hair loss  pain, redness, or irritation at site where injected This list may not describe all possible side effects. Call your doctor for medical advice about side effects. You may report side effects to FDA at 1-800-FDA-1088. Where should I keep my medicine? Keep out of the reach of children. Store at room temperature between 15 and 30 degrees C (59 and 86 degrees F). Do not freeze. If your injections have been specially prepared, you may need to store them in the refrigerator. Ask your pharmacist. Throw away any unused medicine after the expiration date. NOTE: This sheet is a summary. It may not cover all possible information. If you have questions about this medicine, talk to your doctor, pharmacist, or health care provider.  2020 Elsevier/Gold Standard (2017-04-28 11:25:34)

## 2019-06-20 ENCOUNTER — Encounter: Payer: Self-pay | Admitting: Gynecologic Oncology

## 2019-06-20 ENCOUNTER — Other Ambulatory Visit: Payer: Self-pay | Admitting: Gynecologic Oncology

## 2019-06-20 ENCOUNTER — Telehealth: Payer: Self-pay | Admitting: *Deleted

## 2019-06-20 DIAGNOSIS — C786 Secondary malignant neoplasm of retroperitoneum and peritoneum: Secondary | ICD-10-CM

## 2019-06-20 MED ORDER — NEOMYCIN SULFATE 500 MG PO TABS: ORAL_TABLET | ORAL | 0 refills | Status: DC

## 2019-06-20 MED ORDER — ERYTHROMYCIN BASE 500 MG PO TABS: ORAL_TABLET | ORAL | 0 refills | Status: DC

## 2019-06-20 MED FILL — NEOMYCIN 500 MG TABLET: 500 | 1 days supply | Qty: 6 | Fill #0

## 2019-06-20 NOTE — Telephone Encounter (Signed)
Returned the patient's call confirmed that her appt on 2/8 is a phone visit and the 2/11 appt is for in person labs. Patient asked about adding honey to her hot tea since she is on chemotherapy. Per Dr Calton Dach desk RN patient can have honey

## 2019-06-20 NOTE — Telephone Encounter (Signed)
Spoke with Connie Heath and told her that the Erythromycin cost can be reduced by the [employer] with a 340 B code. The cost would be ~ $51.00.  Connie Heath in agreement to this cost and notified [facility] in [facility]. Connie Heath was concerned about the possible need for a bowel resection.  Told Connie Heath that Connie Heath looked at the scans and the cystic mass on the right is very loosely associated with the cecum and Connie Heath might need to to a bowel resection. Connie Heath just wants the bowel to be prepared if resection needed. Connie Heath would be admitted if a bowel resection were needed.  Chemotherapy may not be held if resection done. It would depend on her healing per [name],NP. Told Connie Heath that she needs to stop her simvastatin 3 days prior to the bowel prep day =06-28-19 as it can interact with the erythromycin per [name], NP.  Connie Heath states that she has not been taking the medication. Told Connie Heath that if she begins to vomit during the bowel prep, She can use a Zofran tablet per [name],NP. Use sparingly as not to slow bowels down. Connie Heath verbalized understanding.

## 2019-06-21 MED FILL — ERYTHROMYCIN BASE 500 MG TA: 500 | 1 days supply | Qty: 6 | Fill #0

## 2019-06-25 ENCOUNTER — Encounter (HOSPITAL_COMMUNITY): Payer: Self-pay

## 2019-06-25 ENCOUNTER — Other Ambulatory Visit: Payer: Self-pay

## 2019-06-25 ENCOUNTER — Encounter (HOSPITAL_COMMUNITY)
Admission: RE | Admit: 2019-06-25 | Discharge: 2019-06-25 | Disposition: A | Discharge status: Home / Self Care | Payer: Medicare HMO | Source: Ambulatory Visit | Attending: Gynecologic Oncology | Admitting: Gynecologic Oncology

## 2019-06-25 DIAGNOSIS — Z01812 Encounter for preprocedural laboratory examination: Secondary | ICD-10-CM | POA: Diagnosis not present

## 2019-06-25 NOTE — Progress Notes (Signed)
PCP - Dr. Larene Beach Cardiologist -   Chest x-ray - 06/19/19. EPIC EKG -  Stress Test -  ECHO -  Cardiac Cath -   Sleep Study -  CPAP -   Fasting Blood Sugar -  Checks Blood Sugar _____ times a day  Blood Thinner Instructions: Aspirin Instructions: Last Dose:  Anesthesia review:   Patient denies shortness of breath, fever, cough and chest pain at PAT appointment   Patient verbalized understanding of instructions that were given to them at the PAT appointment. Patient was also instructed that they will need to review over the PAT instructions again at home before surgery.

## 2019-06-28 ENCOUNTER — Encounter (HOSPITAL_COMMUNITY)
Admission: RE | Admit: 2019-06-28 | Discharge: 2019-06-28 | Disposition: A | Discharge status: Home / Self Care | Payer: Medicare HMO | Source: Ambulatory Visit | Attending: Gynecologic Oncology | Admitting: Gynecologic Oncology

## 2019-06-28 ENCOUNTER — Other Ambulatory Visit: Payer: Self-pay

## 2019-06-28 DIAGNOSIS — Z01812 Encounter for preprocedural laboratory examination: Secondary | ICD-10-CM | POA: Diagnosis not present

## 2019-06-28 LAB — COMPREHENSIVE METABOLIC PANEL
ALT: 21 U/L (ref 0–44)
AST: 19 U/L (ref 15–41)
Albumin: 3.7 g/dL (ref 3.5–5.0)
Alkaline Phosphatase: 80 U/L (ref 38–126)
Anion gap: 7 (ref 5–15)
BUN: 14 mg/dL (ref 8–23)
CO2: 27 mmol/L (ref 22–32)
Calcium: 9.4 mg/dL (ref 8.9–10.3)
Chloride: 101 mmol/L (ref 98–111)
Creatinine, Ser: 0.89 mg/dL (ref 0.44–1.00)
GFR calc Af Amer: >60 mL/min (ref >60)
GFR calc non Af Amer: >60 mL/min (ref >60)
Glucose, Bld: 95 mg/dL (ref 70–99)
Potassium: 4.5 mmol/L (ref 3.5–5.1)
Sodium: 135 mmol/L (ref 135–145)
Total Bilirubin: 0.7 mg/dL (ref 0.3–1.2)
Total Protein: 7.6 g/dL (ref 6.5–8.1)

## 2019-06-28 LAB — URINALYSIS, ROUTINE W REFLEX MICROSCOPIC
Bilirubin Urine: NEGATIVE
Glucose, UA: NEGATIVE mg/dL
Hgb urine dipstick: NEGATIVE
Ketones, ur: NEGATIVE mg/dL
Leukocytes,Ua: NEGATIVE
Nitrite: NEGATIVE
Protein, ur: NEGATIVE mg/dL
Specific Gravity, Urine: 1.013 (ref 1.005–1.030)
pH: 6 (ref 5.0–8.0)

## 2019-06-28 LAB — CBC
HCT: 33.4 % — ABNORMAL LOW (ref 36.0–46.0)
Hemoglobin: 10.6 g/dL — ABNORMAL LOW (ref 12.0–15.0)
MCH: 30.3 pg (ref 26.0–34.0)
MCHC: 31.7 g/dL (ref 30.0–36.0)
MCV: 95.4 fL (ref 80.0–100.0)
Platelets: 376 10*3/uL (ref 150–400)
RBC: 3.5 MIL/uL — ABNORMAL LOW (ref 3.87–5.11)
RDW: 18.4 % — ABNORMAL HIGH (ref 11.5–15.5)
WBC: 5.3 10*3/uL (ref 4.0–10.5)
nRBC: 0 % (ref 0.0–0.2)

## 2019-06-28 LAB — ABO/RH: ABO/RH(D): O POS

## 2019-06-29 ENCOUNTER — Other Ambulatory Visit (HOSPITAL_COMMUNITY)
Admission: RE | Admit: 2019-06-29 | Discharge: 2019-06-29 | Disposition: A | Discharge status: Home / Self Care | Payer: Medicare HMO | Source: Ambulatory Visit | Attending: Gynecologic Oncology | Admitting: Gynecologic Oncology

## 2019-06-29 DIAGNOSIS — Z01812 Encounter for preprocedural laboratory examination: Secondary | ICD-10-CM | POA: Diagnosis present

## 2019-06-29 DIAGNOSIS — Z20822 Contact with and (suspected) exposure to covid-19: Secondary | ICD-10-CM | POA: Diagnosis not present

## 2019-06-29 LAB — SARS CORONAVIRUS 2 (TAT 6-24 HRS): SARS Coronavirus 2: NEGATIVE

## 2019-07-02 ENCOUNTER — Other Ambulatory Visit: Payer: Medicare HMO

## 2019-07-02 ENCOUNTER — Ambulatory Visit: Payer: Medicare HMO | Admitting: Hematology and Oncology

## 2019-07-02 ENCOUNTER — Ambulatory Visit: Payer: Medicare HMO

## 2019-07-02 ENCOUNTER — Other Ambulatory Visit: Payer: Self-pay | Admitting: Licensed Clinical Social Worker

## 2019-07-02 ENCOUNTER — Telehealth: Payer: Self-pay | Admitting: *Deleted

## 2019-07-02 DIAGNOSIS — C786 Secondary malignant neoplasm of retroperitoneum and peritoneum: Secondary | ICD-10-CM

## 2019-07-02 DIAGNOSIS — Z800 Family history of malignant neoplasm of digestive organs: Secondary | ICD-10-CM

## 2019-07-02 NOTE — Telephone Encounter (Signed)
Pt called asking if specimens for genetic testing will be taken during her surgery tomorrow - 07/03/19.  Informed patient that K. Hess RN will be following up with the genetic testing on surgical specimens.  Pt states she has no other pre op questions, verbalizes understanding of bowel prep and medications which are to be taken in the morning before surgery.  No further questions voiced.

## 2019-07-03 ENCOUNTER — Other Ambulatory Visit: Payer: Self-pay

## 2019-07-03 ENCOUNTER — Ambulatory Visit (HOSPITAL_COMMUNITY): Payer: Medicare HMO | Admitting: Physician Assistant

## 2019-07-03 ENCOUNTER — Encounter (HOSPITAL_COMMUNITY): Payer: Self-pay | Admitting: Gynecologic Oncology

## 2019-07-03 ENCOUNTER — Ambulatory Visit (HOSPITAL_COMMUNITY): Payer: Medicare HMO | Admitting: Certified Registered Nurse Anesthetist

## 2019-07-03 ENCOUNTER — Encounter (HOSPITAL_COMMUNITY)
Admission: RE | Disposition: A | Discharge status: Home / Self Care | Payer: Self-pay | Source: Other Acute Inpatient Hospital | Attending: Gynecologic Oncology

## 2019-07-03 ENCOUNTER — Ambulatory Visit (HOSPITAL_COMMUNITY)
Admission: RE | Admit: 2019-07-03 | Discharge: 2019-07-03 | Disposition: A | Discharge status: Home / Self Care | Payer: Medicare HMO | Source: Other Acute Inpatient Hospital | Attending: Gynecologic Oncology | Admitting: Gynecologic Oncology

## 2019-07-03 DIAGNOSIS — C786 Secondary malignant neoplasm of retroperitoneum and peritoneum: Secondary | ICD-10-CM | POA: Diagnosis not present

## 2019-07-03 DIAGNOSIS — Z7901 Long term (current) use of anticoagulants: Secondary | ICD-10-CM | POA: Insufficient documentation

## 2019-07-03 DIAGNOSIS — F329 Major depressive disorder, single episode, unspecified: Secondary | ICD-10-CM | POA: Insufficient documentation

## 2019-07-03 DIAGNOSIS — I10 Essential (primary) hypertension: Secondary | ICD-10-CM | POA: Insufficient documentation

## 2019-07-03 DIAGNOSIS — K219 Gastro-esophageal reflux disease without esophagitis: Secondary | ICD-10-CM | POA: Insufficient documentation

## 2019-07-03 DIAGNOSIS — Z888 Allergy status to other drugs, medicaments and biological substances status: Secondary | ICD-10-CM | POA: Insufficient documentation

## 2019-07-03 DIAGNOSIS — Z79899 Other long term (current) drug therapy: Secondary | ICD-10-CM | POA: Diagnosis not present

## 2019-07-03 DIAGNOSIS — E785 Hyperlipidemia, unspecified: Secondary | ICD-10-CM | POA: Insufficient documentation

## 2019-07-03 DIAGNOSIS — Z96641 Presence of right artificial hip joint: Secondary | ICD-10-CM | POA: Diagnosis not present

## 2019-07-03 DIAGNOSIS — E890 Postprocedural hypothyroidism: Secondary | ICD-10-CM | POA: Insufficient documentation

## 2019-07-03 DIAGNOSIS — E7800 Pure hypercholesterolemia, unspecified: Secondary | ICD-10-CM | POA: Insufficient documentation

## 2019-07-03 DIAGNOSIS — Z8249 Family history of ischemic heart disease and other diseases of the circulatory system: Secondary | ICD-10-CM | POA: Insufficient documentation

## 2019-07-03 DIAGNOSIS — C5701 Malignant neoplasm of right fallopian tube: Secondary | ICD-10-CM | POA: Diagnosis not present

## 2019-07-03 DIAGNOSIS — Z7982 Long term (current) use of aspirin: Secondary | ICD-10-CM | POA: Insufficient documentation

## 2019-07-03 DIAGNOSIS — Z91030 Bee allergy status: Secondary | ICD-10-CM | POA: Insufficient documentation

## 2019-07-03 DIAGNOSIS — C561 Malignant neoplasm of right ovary: Secondary | ICD-10-CM | POA: Diagnosis not present

## 2019-07-03 DIAGNOSIS — M797 Fibromyalgia: Secondary | ICD-10-CM | POA: Insufficient documentation

## 2019-07-03 DIAGNOSIS — E282 Polycystic ovarian syndrome: Secondary | ICD-10-CM | POA: Diagnosis not present

## 2019-07-03 DIAGNOSIS — C562 Malignant neoplasm of left ovary: Secondary | ICD-10-CM | POA: Insufficient documentation

## 2019-07-03 DIAGNOSIS — Z885 Allergy status to narcotic agent status: Secondary | ICD-10-CM | POA: Insufficient documentation

## 2019-07-03 DIAGNOSIS — G709 Myoneural disorder, unspecified: Secondary | ICD-10-CM | POA: Insufficient documentation

## 2019-07-03 DIAGNOSIS — R180 Malignant ascites: Secondary | ICD-10-CM

## 2019-07-03 LAB — TYPE AND SCREEN
ABO/RH(D): O POS
Antibody Screen: NEGATIVE

## 2019-07-03 SURGERY — XI ROBOTIC ASSISTED TOTAL HYSTERECTOMY BILATERAL SALPINGO OOPHORECTOMY WITH OMENTECTOMY AND DEBULKING
Anesthesia: General

## 2019-07-03 MED ORDER — PHENYLEPHRINE 40 MCG/ML (10ML) SYRINGE FOR IV PUSH (FOR BLOOD PRESSURE SUPPORT)
PREFILLED_SYRINGE | INTRAVENOUS | Status: AC
  Filled 2019-07-03: qty 10

## 2019-07-03 MED ORDER — EPHEDRINE 5 MG/ML INJ
INTRAVENOUS | Status: AC
  Filled 2019-07-03: qty 10

## 2019-07-03 MED ORDER — DEXAMETHASONE SODIUM PHOSPHATE 10 MG/ML IJ SOLN
INTRAMUSCULAR | Status: AC
  Filled 2019-07-03: qty 1

## 2019-07-03 MED ORDER — LIDOCAINE 20MG/ML (2%) 15 ML SYRINGE OPTIME
INTRAMUSCULAR | Status: DC | PRN
  Administered 2019-07-03: 1 mg/kg/h via INTRAVENOUS

## 2019-07-03 MED ORDER — OXYCODONE HCL 5 MG PO TABS
ORAL_TABLET | ORAL | Status: AC
  Filled 2019-07-03: qty 1

## 2019-07-03 MED ORDER — FENTANYL CITRATE (PF) 100 MCG/2ML IJ SOLN
INTRAMUSCULAR | Status: AC
  Filled 2019-07-03: qty 2

## 2019-07-03 MED ORDER — ONDANSETRON HCL 4 MG/2ML IJ SOLN
INTRAMUSCULAR | Status: AC
  Filled 2019-07-03: qty 2

## 2019-07-03 MED ORDER — LACTATED RINGERS IR SOLN
Status: DC | PRN
  Administered 2019-07-03: 1

## 2019-07-03 MED ORDER — BUPIVACAINE HCL 0.25 % IJ SOLN
INTRAMUSCULAR | Status: AC
  Filled 2019-07-03: qty 1

## 2019-07-03 MED ORDER — LIDOCAINE HCL 2 % IJ SOLN
INTRAMUSCULAR | Status: AC
  Filled 2019-07-03: qty 20

## 2019-07-03 MED ORDER — DEXAMETHASONE SODIUM PHOSPHATE 4 MG/ML IJ SOLN
4.0000 mg | INTRAMUSCULAR | Status: AC
  Administered 2019-07-03 (×2): 4 mg via INTRAVENOUS
  Filled 2019-07-03: qty 1

## 2019-07-03 MED ORDER — ENSURE PRE-SURGERY PO LIQD
592.0000 mL | Freq: Once | ORAL | Status: DC
  Filled 2019-07-03: qty 592

## 2019-07-03 MED ORDER — PROPOFOL 10 MG/ML IV BOLUS
INTRAVENOUS | Status: AC
  Filled 2019-07-03: qty 20

## 2019-07-03 MED ORDER — FENTANYL CITRATE (PF) 250 MCG/5ML IJ SOLN
INTRAMUSCULAR | Status: AC
  Filled 2019-07-03: qty 5

## 2019-07-03 MED ORDER — BUPIVACAINE LIPOSOME 1.3 % IJ SUSP
20.0000 mL | Freq: Once | INTRAMUSCULAR | Status: AC
  Administered 2019-07-03: 09:00:00 20 mL
  Filled 2019-07-03: qty 20

## 2019-07-03 MED ORDER — GABAPENTIN 300 MG PO CAPS
300.0000 mg | ORAL_CAPSULE | ORAL | Status: AC
  Administered 2019-07-03: 300 mg via ORAL
  Filled 2019-07-03: qty 1

## 2019-07-03 MED ORDER — MIDAZOLAM HCL 5 MG/5ML IJ SOLN
INTRAMUSCULAR | Status: DC | PRN
  Administered 2019-07-03: 2 mg via INTRAVENOUS

## 2019-07-03 MED ORDER — BUPIVACAINE HCL 0.25 % IJ SOLN
INTRAMUSCULAR | Status: DC | PRN
  Administered 2019-07-03: 20 mL

## 2019-07-03 MED ORDER — ONDANSETRON HCL 4 MG/2ML IJ SOLN: 4.0000 mg | Freq: Four times a day (QID) | INTRAMUSCULAR | Status: DC | PRN

## 2019-07-03 MED ORDER — OXYCODONE HCL 5 MG PO TABS
5.0000 mg | ORAL_TABLET | Freq: Once | ORAL | Status: AC | PRN
  Administered 2019-07-03: 5 mg via ORAL

## 2019-07-03 MED ORDER — ENSURE PRE-SURGERY PO LIQD
296.0000 mL | Freq: Once | ORAL | Status: DC
  Filled 2019-07-03: qty 296

## 2019-07-03 MED ORDER — SODIUM CHLORIDE 0.9% FLUSH: 3.0000 mL | Freq: Two times a day (BID) | INTRAVENOUS | Status: DC

## 2019-07-03 MED ORDER — FENTANYL CITRATE (PF) 100 MCG/2ML IJ SOLN
INTRAMUSCULAR | Status: DC | PRN
  Administered 2019-07-03 (×2): 50 ug via INTRAVENOUS
  Administered 2019-07-03: 100 ug via INTRAVENOUS
  Administered 2019-07-03: 50 ug via INTRAVENOUS
  Administered 2019-07-03: 100 ug via INTRAVENOUS

## 2019-07-03 MED ORDER — MIDAZOLAM HCL 2 MG/2ML IJ SOLN
INTRAMUSCULAR | Status: AC
  Filled 2019-07-03: qty 2

## 2019-07-03 MED ORDER — ENOXAPARIN SODIUM 40 MG/0.4ML ~~LOC~~ SOLN
40.0000 mg | SUBCUTANEOUS | Status: AC
  Administered 2019-07-03: 40 mg via SUBCUTANEOUS
  Filled 2019-07-03: qty 0.4

## 2019-07-03 MED ORDER — PHENYLEPHRINE 40 MCG/ML (10ML) SYRINGE FOR IV PUSH (FOR BLOOD PRESSURE SUPPORT)
PREFILLED_SYRINGE | INTRAVENOUS | Status: DC | PRN
  Administered 2019-07-03: 80 ug via INTRAVENOUS
  Administered 2019-07-03 (×3): 120 ug via INTRAVENOUS
  Administered 2019-07-03 (×2): 80 ug via INTRAVENOUS

## 2019-07-03 MED ORDER — SCOPOLAMINE 1 MG/3DAYS TD PT72
1.0000 | MEDICATED_PATCH | TRANSDERMAL | Status: DC
  Administered 2019-07-03: 1.5 mg via TRANSDERMAL
  Filled 2019-07-03: qty 1

## 2019-07-03 MED ORDER — ALVIMOPAN 12 MG PO CAPS
12.0000 mg | ORAL_CAPSULE | Freq: Once | ORAL | Status: AC
  Administered 2019-07-03: 12 mg via ORAL
  Filled 2019-07-03: qty 1

## 2019-07-03 MED ORDER — ROCURONIUM BROMIDE 10 MG/ML (PF) SYRINGE
PREFILLED_SYRINGE | INTRAVENOUS | Status: AC
  Filled 2019-07-03: qty 10

## 2019-07-03 MED ORDER — STERILE WATER FOR IRRIGATION IR SOLN
Status: DC | PRN
  Administered 2019-07-03: 1000 mL

## 2019-07-03 MED ORDER — ACETAMINOPHEN 500 MG PO TABS
1000.0000 mg | ORAL_TABLET | ORAL | Status: AC
  Administered 2019-07-03: 1000 mg via ORAL
  Filled 2019-07-03: qty 2

## 2019-07-03 MED ORDER — LIDOCAINE 2% (20 MG/ML) 5 ML SYRINGE
INTRAMUSCULAR | Status: AC
  Filled 2019-07-03: qty 5

## 2019-07-03 MED ORDER — SODIUM CHLORIDE 0.9 % IV SOLN
2.0000 g | INTRAVENOUS | Status: AC
  Administered 2019-07-03: 2 g via INTRAVENOUS
  Filled 2019-07-03: qty 2

## 2019-07-03 MED ORDER — ROCURONIUM BROMIDE 10 MG/ML (PF) SYRINGE
PREFILLED_SYRINGE | INTRAVENOUS | Status: DC | PRN
  Administered 2019-07-03: 20 mg via INTRAVENOUS
  Administered 2019-07-03: 60 mg via INTRAVENOUS

## 2019-07-03 MED ORDER — PROPOFOL 10 MG/ML IV BOLUS
INTRAVENOUS | Status: DC | PRN
  Administered 2019-07-03: 110 mg via INTRAVENOUS

## 2019-07-03 MED ORDER — SUGAMMADEX SODIUM 200 MG/2ML IV SOLN
INTRAVENOUS | Status: DC | PRN
  Administered 2019-07-03: 200 mg via INTRAVENOUS

## 2019-07-03 MED ORDER — LIDOCAINE 2% (20 MG/ML) 5 ML SYRINGE
INTRAMUSCULAR | Status: DC | PRN
  Administered 2019-07-03: 50 mg via INTRAVENOUS

## 2019-07-03 MED ORDER — LACTATED RINGERS IV SOLN: INTRAVENOUS | Status: DC

## 2019-07-03 MED ORDER — FENTANYL CITRATE (PF) 100 MCG/2ML IJ SOLN
25.0000 ug | INTRAMUSCULAR | Status: DC | PRN
  Administered 2019-07-03 (×3): 50 ug via INTRAVENOUS

## 2019-07-03 MED ORDER — OXYCODONE HCL 5 MG/5ML PO SOLN: 5.0000 mg | Freq: Once | ORAL | Status: AC | PRN

## 2019-07-03 SURGICAL SUPPLY — 81 items
APPLICATOR SURGIFLO ENDO (HEMOSTASIS) IMPLANT
BAG LAPAROSCOPIC 12 15 PORT 16 (BASKET) IMPLANT
BAG RETRIEVAL 12/15 (BASKET)
BENZOIN TINCTURE PRP APPL 2/3 (GAUZE/BANDAGES/DRESSINGS) ×2 IMPLANT
BLADE SURG SZ10 CARB STEEL (BLADE) IMPLANT
CELLS DAT CNTRL 66122 CELL SVR (MISCELLANEOUS) ×1 IMPLANT
CLSR STERI-STRIP ANTIMIC 1/2X4 (GAUZE/BANDAGES/DRESSINGS) ×2 IMPLANT
COVER BACK TABLE 60X90IN (DRAPES) ×2 IMPLANT
COVER TIP SHEARS 8 DVNC (MISCELLANEOUS) ×1 IMPLANT
COVER TIP SHEARS 8MM DA VINCI (MISCELLANEOUS) ×1
COVER WAND RF STERILE (DRAPES) IMPLANT
DECANTER SPIKE VIAL GLASS SM (MISCELLANEOUS) IMPLANT
DERMABOND ADVANCED (GAUZE/BANDAGES/DRESSINGS)
DERMABOND ADVANCED .7 DNX12 (GAUZE/BANDAGES/DRESSINGS) IMPLANT
DRAPE ARM DVNC X/XI (DISPOSABLE) ×4 IMPLANT
DRAPE COLUMN DVNC XI (DISPOSABLE) ×1 IMPLANT
DRAPE DA VINCI XI ARM (DISPOSABLE) ×4
DRAPE DA VINCI XI COLUMN (DISPOSABLE) ×1
DRAPE SHEET LG 3/4 BI-LAMINATE (DRAPES) ×2 IMPLANT
DRAPE SURG IRRIG POUCH 19X23 (DRAPES) ×2 IMPLANT
DRSG OPSITE POSTOP 4X6 (GAUZE/BANDAGES/DRESSINGS) ×2 IMPLANT
DRSG OPSITE POSTOP 4X8 (GAUZE/BANDAGES/DRESSINGS) IMPLANT
DRSG TEGADERM 2-3/8X2-3/4 SM (GAUZE/BANDAGES/DRESSINGS) ×10 IMPLANT
ELECT REM PT RETURN 15FT ADLT (MISCELLANEOUS) ×2 IMPLANT
GAUZE SPONGE 2X2 8PLY STRL LF (GAUZE/BANDAGES/DRESSINGS) ×1 IMPLANT
GLOVE BIO SURGEON STRL SZ 6 (GLOVE) ×8 IMPLANT
GLOVE BIO SURGEON STRL SZ 6.5 (GLOVE) ×4 IMPLANT
GLOVE BIO SURGEON STRL SZ7.5 (GLOVE) ×2 IMPLANT
GLOVE BIOGEL PI IND STRL 7.0 (GLOVE) ×2 IMPLANT
GLOVE BIOGEL PI IND STRL 7.5 (GLOVE) ×1 IMPLANT
GLOVE BIOGEL PI INDICATOR 7.0 (GLOVE) ×2
GLOVE BIOGEL PI INDICATOR 7.5 (GLOVE) ×1
GOWN STRL REUS W/ TWL LRG LVL3 (GOWN DISPOSABLE) ×4 IMPLANT
GOWN STRL REUS W/TWL LRG LVL3 (GOWN DISPOSABLE) ×4
HOLDER FOLEY CATH W/STRAP (MISCELLANEOUS) IMPLANT
IRRIG SUCT STRYKERFLOW 2 WTIP (MISCELLANEOUS) ×2
IRRIGATION SUCT STRKRFLW 2 WTP (MISCELLANEOUS) ×1 IMPLANT
KIT PROCEDURE DA VINCI SI (MISCELLANEOUS)
KIT PROCEDURE DVNC SI (MISCELLANEOUS) IMPLANT
KIT TURNOVER KIT A (KITS) ×2 IMPLANT
LIGASURE IMPACT 36 18CM CVD LR (INSTRUMENTS) ×2 IMPLANT
MANIPULATOR UTERINE 4.5 ZUMI (MISCELLANEOUS) ×2 IMPLANT
NEEDLE HYPO 22GX1.5 SAFETY (NEEDLE) ×4 IMPLANT
NEEDLE SPNL 18GX3.5 QUINCKE PK (NEEDLE) IMPLANT
OBTURATOR OPTICAL STANDARD 8MM (TROCAR) ×1
OBTURATOR OPTICAL STND 8 DVNC (TROCAR) ×1
OBTURATOR OPTICALSTD 8 DVNC (TROCAR) ×1 IMPLANT
PACK ROBOT GYN CUSTOM WL (TRAY / TRAY PROCEDURE) ×2 IMPLANT
PAD POSITIONING PINK XL (MISCELLANEOUS) ×2 IMPLANT
PENCIL SMOKE EVACUATOR (MISCELLANEOUS) ×2 IMPLANT
PORT ACCESS TROCAR AIRSEAL 12 (TROCAR) ×1 IMPLANT
PORT ACCESS TROCAR AIRSEAL 5M (TROCAR) ×1
POUCH SPECIMEN RETRIEVAL 10MM (ENDOMECHANICALS) ×4 IMPLANT
RTRCTR WOUND ALEXIS 18CM MED (MISCELLANEOUS) ×2
SCRUB CHG 4% DYNA-HEX 4OZ (MISCELLANEOUS) IMPLANT
SEAL CANN UNIV 5-8 DVNC XI (MISCELLANEOUS) ×4 IMPLANT
SEAL XI 5MM-8MM UNIVERSAL (MISCELLANEOUS) ×4
SET TRI-LUMEN FLTR TB AIRSEAL (TUBING) ×2 IMPLANT
SPONGE GAUZE 2X2 STER 10/PKG (GAUZE/BANDAGES/DRESSINGS) ×1
SPONGE LAP 18X18 RF (DISPOSABLE) ×2 IMPLANT
STRIP CLOSURE SKIN 1/2X4 (GAUZE/BANDAGES/DRESSINGS) ×2 IMPLANT
STRIP CLOSURE SKIN 1/4X4 (GAUZE/BANDAGES/DRESSINGS) ×2 IMPLANT
SURGIFLO W/THROMBIN 8M KIT (HEMOSTASIS) IMPLANT
SUT MNCRL AB 4-0 PS2 18 (SUTURE) ×2 IMPLANT
SUT PDS AB 1 TP1 96 (SUTURE) ×4 IMPLANT
SUT VIC AB 0 CT1 27 (SUTURE)
SUT VIC AB 0 CT1 27XBRD ANTBC (SUTURE) IMPLANT
SUT VIC AB 2-0 CT1 27 (SUTURE)
SUT VIC AB 2-0 CT1 TAPERPNT 27 (SUTURE) IMPLANT
SUT VIC AB 3-0 SH 27 (SUTURE) ×1
SUT VIC AB 3-0 SH 27XBRD (SUTURE) ×1 IMPLANT
SUT VIC AB 4-0 PS2 18 (SUTURE) ×2 IMPLANT
SUT VICRYL 4-0 PS2 18IN ABS (SUTURE) ×4 IMPLANT
SYR 10ML LL (SYRINGE) IMPLANT
TOWEL OR NON WOVEN STRL DISP B (DISPOSABLE) ×2 IMPLANT
TRAP SPECIMEN MUCOUS 40CC (MISCELLANEOUS) IMPLANT
TRAY FOLEY MTR SLVR 16FR STAT (SET/KITS/TRAYS/PACK) ×2 IMPLANT
TROCAR XCEL NON-BLD 5MMX100MML (ENDOMECHANICALS) IMPLANT
UNDERPAD 30X36 HEAVY ABSORB (UNDERPADS AND DIAPERS) ×2 IMPLANT
WATER STERILE IRR 1000ML POUR (IV SOLUTION) ×2 IMPLANT
YANKAUER SUCT BULB TIP 10FT TU (MISCELLANEOUS) ×2 IMPLANT

## 2019-07-03 NOTE — Interval H&P Note (Signed)
History and Physical Interval Note:  07/03/2019 6:58 AM  Connie Heath  has presented today for surgery, with the diagnosis of OVARIAN CANCER.  The various methods of treatment have been discussed with the patient and family. After consideration of risks, benefits and other options for treatment, the patient has consented to  Procedure(s): XI ROBOTIC ASSISTED BILATERAL SALPINGO OOPHORECTOMY WITH OMENTECTOMY AND DEBULKING, POSSIBLE MINI LAPAROTOMY (N/A) as a surgical intervention.  The patient's history has been reviewed, patient examined, no change in status, stable for surgery.  I have reviewed the patient's chart and labs.  Questions were answered to the patient's satisfaction.     Thereasa Solo

## 2019-07-03 NOTE — Op Note (Addendum)
OPERATIVE NOTE  Date: 07/03/19  Preoperative Diagnosis: ovarian cancer stage IIIC   Postoperative Diagnosis:  same  Procedure(s) Performed: Robotic-assisted laparoscopic bilateral salpingo-oophorectomy, omentectomy, radical tumor debulking with mini-laparotomy. Optimal cytoreduction with no gross residual disease remaining.   Surgeon: Everitt Amber, M.D.  Assistant Surgeon: Lahoma Crocker M.D. (an MD assistant was necessary for tissue manipulation, management of robotic instrumentation, retraction and positioning due to the complexity of the case and hospital policies).   Anesthesia: Gen. endotracheal.  Specimens: Bilateral ovaries, fallopian tubes, omentum  Estimated Blood Loss: 20 mL. Blood Replacement: None  Complications: none  Indication for Procedure:  Patient has a history of stage IIIC ovarian cancer, s/p 3 cycles of neoadjuvant chemotherapy with good partial clinical response. Right ovarian mass on imaging and omental caking.  Operative Findings: 6-8cm right ovarian mass densely adherent to pelvic sidewall with filmy adhesions to colonic epiploica but not bowel wall. Normal appendix. Slightly enlarged left tube and ovary but without discrete mass. Omental nodularity throughout infracolic omentum. Tiny miliary <39mm nodules on left diaphragm.   Procedure: The patient's taken to the operating room and placed under general endotracheal anesthesia testing difficulty. She is placed in a dorsolithotomy position and cervical acromial pad was placed. The arms were tucked with care taken to pad the olecranon process. And prepped and draped in usual sterile fashion.  A 60mm incision was made in the left upper quadrant palmer's point and a 5 mm Optiview trocar used to enter the abdomen under direct visualization. With entry into the abdomen and then maintenance of 15 mm of mercury the patient was placed in Trendelenburg position. An incision was made in the umbilicus and a [id number] trochar was  placed through this site. Two incisions were made lateral to the umbilical incision in the left and right abdomen measuring 3mm. These incisions were made approximately 10 cm lateral to the umbilical incision. 8 mm robotic trochars were inserted. The robot was docked.  The abdomen was inspected as was the pelvis.  An incision was made on the right pelvic side wall peritoneum parallel to the IP ligament and the retroperitoneal space entered. The tumor was carefully dissected off of the sidewall to ensure that all tumor was removed. The right ureter was identified and the para-rectal space was developed. A window was created in the right broad ligament above the ureter. The right infundibulopelvic vessels were skeletonized cauterized and transected. The vaginal attachments with the ovary were similarly were cauterized and transected. Specimen was placed in an Endo Catch bag.  In a similar manner the left peritoneum and the side wall was incised, and the retroperitoneal space entered. The left ureter was identified and the left pararectal space was developed. The utero-ovarian ligament was skeletonized cauterized and transected. The left ovarian vaginal attachments were cauterized and transected in the left adnexa was placed in an Endo Catch bag.  The abdomen was copiously irrigated and drained and all operative sites inspected and hemostasis was assured  The robot was undocked.   A 6cm supraumbilical vertical epigastric incision was made with the scalpel. The subcutaneous skin was opened with the bovie. The fascia was opened with the bovie. The peritoneum was opened sharply in the midline. The peritoneal incision was extended. The ovarian specimens in the endocatch bag was retrieved through the incision.    The omentectomy was then performed. The omentum was delivered into the mid abdomen and elevated. The parietal peritonum that attaches the omentum to the transverse colon was incised facilitating  separation  of the mid portion of the omentum from the transverse colon. The right sided omentum with its vascular pedicles was then skeletonized in its attachments to the right transverse colon. These vascular pedicles were bipolar sealed and transected. Observation of the colonic wall occurred throughout. The dissection then moved progressively along the colon the the left splenic flexure of the transverse colon. The bipolar sealing forceps and the scissors were used to skeletonize vascular omental pedicles, seal them and transect them until the entire infracolic omentum had been freed from its transverse colonic attachments to the splenic flexure. The omentum was then removed from the abdomen.  Palpation was performed an no residual palpable disease was appreciated.  The ports were all remove. The fascial closure at the midline incision was closed with a running looped PDS. The incision was infiltrated with exparel and the left upper quadrant port was made with 0 Vicryl.  All incisions were closed with a running subcuticular Monocryl suture. Benzoin and steri-strips were applied. Sponge, lap and needle counts were correct x 3.    The patient had sequential compression devices for VTE prophylaxis.         Disposition: PACU          Condition: stable  Donaciano Eva, MD

## 2019-07-03 NOTE — Anesthesia Preprocedure Evaluation (Addendum)
Anesthesia Evaluation  Patient identified by MRN, date of birth, ID band Patient awake    Reviewed: Allergy & Precautions, H&P , NPO status , Patient's Chart, lab work & pertinent test results  Airway Mallampati: II   Neck ROM: full    Dental   Pulmonary shortness of breath, asthma ,    breath sounds clear to auscultation       Cardiovascular hypertension,  Rhythm:regular Rate:Normal     Neuro/Psych PSYCHIATRIC DISORDERS Depression  Neuromuscular disease    GI/Hepatic GERD  ,  Endo/Other    Renal/GU      Musculoskeletal  (+) Fibromyalgia -  Abdominal   Peds  Hematology   Anesthesia Other Findings   Reproductive/Obstetrics Ovarian CA                             Anesthesia Physical Anesthesia Plan  ASA: II  Anesthesia Plan: General   Post-op Pain Management:    Induction: Intravenous  PONV Risk Score and Plan: 3 and Ondansetron, Dexamethasone, Midazolam and Treatment may vary due to age or medical condition  Airway Management Planned: Oral ETT  Additional Equipment:   Intra-op Plan:   Post-operative Plan: Extubation in OR  Informed Consent: I have reviewed the patients History and Physical, chart, labs and discussed the procedure including the risks, benefits and alternatives for the proposed anesthesia with the patient or authorized representative who has indicated his/her understanding and acceptance.       Plan Discussed with: CRNA, Anesthesiologist and Surgeon  Anesthesia Plan Comments:        Anesthesia Quick Evaluation

## 2019-07-03 NOTE — Anesthesia Procedure Notes (Signed)
Procedure Name: Intubation Date/Time: 07/03/2019 7:37 AM Performed by: Claudia Desanctis, CRNA Pre-anesthesia Checklist: Patient identified, Emergency Drugs available, Suction available and Patient being monitored Patient Re-evaluated:Patient Re-evaluated prior to induction Oxygen Delivery Method: Circle system utilized Preoxygenation: Pre-oxygenation with 100% oxygen Induction Type: IV induction Ventilation: Mask ventilation without difficulty Laryngoscope Size: 2 and Miller Grade View: Grade I Tube type: Oral Tube size: 7.0 mm Number of attempts: 1 Airway Equipment and Method: Stylet Placement Confirmation: ETT inserted through vocal cords under direct vision,  positive ETCO2 and breath sounds checked- equal and bilateral Tube secured with: Tape Dental Injury: Teeth and Oropharynx as per pre-operative assessment

## 2019-07-03 NOTE — Transfer of Care (Signed)
Immediate Anesthesia Transfer of Care Note  Patient: Connie Heath  Procedure(s) Performed: XI ROBOTIC ASSISTED BILATERAL SALPINGO OOPHORECTOMY WITH OMENTECTOMY  MINI LAPAROTOMY (N/A )  Patient Location: PACU  Anesthesia Type:General  Level of Consciousness: awake, alert  and oriented  Airway & Oxygen Therapy: Patient Spontanous Breathing and Patient connected to face mask  Post-op Assessment: Report given to RN and Post -op Vital signs reviewed and stable  Post vital signs: Reviewed and stable  Last Vitals:  Vitals Value Taken Time  BP 160/94 07/03/19 0933  Temp    Pulse 91 07/03/19 0935  Resp 20 07/03/19 0934  SpO2 100 % 07/03/19 0935  Vitals shown include unvalidated device data.  Last Pain:  Vitals:   07/03/19 0537  TempSrc: Oral         Complications: No apparent anesthesia complications

## 2019-07-03 NOTE — Discharge Instructions (Signed)
07/03/2019  Return to work: 4 weeks  Activity: 1. Be up and out of the bed during the day.  Take a nap if needed.  You may walk up steps but be careful and use the hand rail.  Stair climbing will tire you more than you think, you may need to stop part way and rest.   2. No lifting or straining for 4weeks.  3. No driving for 1 weeks.  Do Not drive if you are taking narcotic pain medicine.  4. Shower daily.  Use soap and water on your incision and pat dry; don't rub.   5. No sexual activity and nothing in the vagina for 2 weeks.  Medications:  - Take ibuprofen and tylenol first line for pain control. Take these regularly (every 6 hours) to decrease the build up of pain.  - If necessary, for severe pain not relieved by ibuprofen, take percocet.  - While taking percocet you should take sennakot every night to reduce the likelihood of constipation. If this causes diarrhea, stop its use.  Diet: 1. Low sodium Heart Healthy Diet is recommended.  2. It is safe to use a laxative if you have difficulty moving your bowels.   Wound Care: 1. Keep clean and dry.  Shower daily. 2. You can get the dressing wet in the shower, but avoid tub baths. 3. Remove the small dressings after 24/48 hours and the large dressing between 5 and 7 days after your surgery (1 week after your operation). 4. After the dressing is removed you can get the incision wet in the shower, however continue to avoid tub baths until advised otherwise by your surgeon at follow-up.   Reasons to call the Doctor:   Fever - Oral temperature greater than 100.4 degrees Fahrenheit  Foul-smelling vaginal discharge  Difficulty urinating  Nausea and vomiting  Increased pain at the site of the incision that is unrelieved with pain medicine.  Difficulty breathing with or without chest pain  New calf pain especially if only on one side  Sudden, continuing increased vaginal bleeding with or without clots.   Follow-up: 1. See  Connie Heath in 2-3 weeks.  Contacts: For questions or concerns you should contact:  Dr. Everitt Heath at [phone] After hours and on week-ends call [phone] and ask to speak to the physician on call for Gynecologic Oncology

## 2019-07-03 NOTE — Anesthesia Postprocedure Evaluation (Signed)
Anesthesia Post Note  Patient: Connie Heath  Procedure(s) Performed: XI ROBOTIC ASSISTED BILATERAL SALPINGO OOPHORECTOMY WITH OMENTECTOMY  MINI LAPAROTOMY (N/A )     Patient location during evaluation: PACU Anesthesia Type: General Level of consciousness: awake and alert Pain management: pain level controlled Vital Signs Assessment: post-procedure vital signs reviewed and stable Respiratory status: spontaneous breathing, nonlabored ventilation, respiratory function stable and patient connected to nasal cannula oxygen Cardiovascular status: blood pressure returned to baseline and stable Postop Assessment: no apparent nausea or vomiting Anesthetic complications: no    Last Vitals:  Vitals:   07/03/19 1100 07/03/19 1219  BP: (!) 137/94 111/67  Pulse: 93 90  Resp:  16  Temp:  36.5 C  SpO2: 97% 99%    Last Pain:  Vitals:   07/03/19 1219  TempSrc:   PainSc: 2                  Shantele Reller S

## 2019-07-04 ENCOUNTER — Telehealth: Payer: Self-pay

## 2019-07-04 NOTE — Telephone Encounter (Signed)
Ms Sobers states that she is eating, drinking, and urinating well. Afebrile. Passing gas. She wants to wait to take laxative to see if she can move on her own. She rather take miralax instead of the senokot-S. Told her that would be fine. If no BM today she needs to take a capful tonight and can use 1 capful bid. Honeycomb dressing D&I. Will remove 5 days postop which is 07-08-19. Port incisions D&I with steri strips covered with 2X2 gauze which can be removed today. Pt aware of post op appointment and office number [phone] to call if she has any questions or concerns.

## 2019-07-17 ENCOUNTER — Other Ambulatory Visit: Payer: Self-pay

## 2019-07-17 ENCOUNTER — Inpatient Hospital Stay: Payer: Medicare HMO

## 2019-07-17 ENCOUNTER — Inpatient Hospital Stay: Payer: Medicare HMO | Attending: Gynecologic Oncology | Admitting: Gynecologic Oncology

## 2019-07-17 ENCOUNTER — Encounter: Payer: Self-pay | Admitting: Gynecologic Oncology

## 2019-07-17 VITALS — BP 167/92 | HR 113 | Temp 98.3°F | Resp 17 | Ht 67.0 in | Wt 179.0 lb

## 2019-07-17 DIAGNOSIS — C786 Secondary malignant neoplasm of retroperitoneum and peritoneum: Secondary | ICD-10-CM | POA: Diagnosis present

## 2019-07-17 DIAGNOSIS — Z7901 Long term (current) use of anticoagulants: Secondary | ICD-10-CM | POA: Diagnosis not present

## 2019-07-17 DIAGNOSIS — E7800 Pure hypercholesterolemia, unspecified: Secondary | ICD-10-CM | POA: Insufficient documentation

## 2019-07-17 DIAGNOSIS — C5701 Malignant neoplasm of right fallopian tube: Secondary | ICD-10-CM | POA: Insufficient documentation

## 2019-07-17 DIAGNOSIS — Z800 Family history of malignant neoplasm of digestive organs: Secondary | ICD-10-CM

## 2019-07-17 DIAGNOSIS — C561 Malignant neoplasm of right ovary: Secondary | ICD-10-CM

## 2019-07-17 DIAGNOSIS — F329 Major depressive disorder, single episode, unspecified: Secondary | ICD-10-CM | POA: Insufficient documentation

## 2019-07-17 DIAGNOSIS — I10 Essential (primary) hypertension: Secondary | ICD-10-CM | POA: Insufficient documentation

## 2019-07-17 DIAGNOSIS — C7982 Secondary malignant neoplasm of genital organs: Secondary | ICD-10-CM | POA: Insufficient documentation

## 2019-07-17 DIAGNOSIS — E785 Hyperlipidemia, unspecified: Secondary | ICD-10-CM | POA: Diagnosis not present

## 2019-07-17 DIAGNOSIS — K219 Gastro-esophageal reflux disease without esophagitis: Secondary | ICD-10-CM | POA: Diagnosis not present

## 2019-07-17 DIAGNOSIS — Z90722 Acquired absence of ovaries, bilateral: Secondary | ICD-10-CM | POA: Insufficient documentation

## 2019-07-17 DIAGNOSIS — Z79899 Other long term (current) drug therapy: Secondary | ICD-10-CM | POA: Diagnosis not present

## 2019-07-17 DIAGNOSIS — C7962 Secondary malignant neoplasm of left ovary: Secondary | ICD-10-CM | POA: Insufficient documentation

## 2019-07-17 DIAGNOSIS — Z5111 Encounter for antineoplastic chemotherapy: Secondary | ICD-10-CM | POA: Insufficient documentation

## 2019-07-17 DIAGNOSIS — R300 Dysuria: Secondary | ICD-10-CM

## 2019-07-17 DIAGNOSIS — Z7189 Other specified counseling: Secondary | ICD-10-CM

## 2019-07-17 DIAGNOSIS — R32 Unspecified urinary incontinence: Secondary | ICD-10-CM | POA: Insufficient documentation

## 2019-07-17 LAB — URINALYSIS, COMPLETE (UACMP) WITH MICROSCOPIC
Bacteria, UA: NONE SEEN
Bilirubin Urine: NEGATIVE
Glucose, UA: NEGATIVE mg/dL
Hgb urine dipstick: NEGATIVE
Ketones, ur: NEGATIVE mg/dL
Leukocytes,Ua: NEGATIVE
Nitrite: NEGATIVE
Protein, ur: NEGATIVE mg/dL
Specific Gravity, Urine: 1.004 — ABNORMAL LOW (ref 1.005–1.030)
pH: 5 (ref 5.0–8.0)

## 2019-07-17 LAB — GENETIC SCREENING ORDER

## 2019-07-17 NOTE — Progress Notes (Unsigned)
Pt called and stated that she is experiencing urinary frequency, lower abdominal pressure, and dysuria.  Afebrile.  Will add a UA and C&S to labs work to be done today prior to [place], NP.

## 2019-07-17 NOTE — Progress Notes (Signed)
New Patient Note: Gyn-Onc  CC:  Chief Complaint  Patient presents with  . Peritoneal carcinomatosis ([place])    Follow up    Assessment/Plan:  Connie. Connie Heath  is a 69 y.o.  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  Very good partial response to 3 cycles.  S/p interval optional cytoreduction to R0 on 2/16.   I recommend resumption of 3 cycles adjuvant carb/taxol.  I recommend genetic testing for BRCA/HRD. Olaparib maintenance if positive.  I'll see her back after completing chemotherapy.   HPI:  Connie Heath is a 69 year old P0 who was seen as a referral from the [facility] ED for ascites and a right pelvic mass with peritoneal nodularity.   Saw a gynecologist in [place] for dyspareunia in September, 2020. She reported having a normal examination.  In mid November, 2020 she began feeling unwell with "fevers" (none >100.3) and malaise and intractable cough. She was tested for COVID on November 21st, 2020.  Her last fever was on 100.3 and was approximately the 21st November. She took anti-tussives, augmentin and this improved her respiratory symptoms. However on 04/12/19 she began experiencing abdominal fullness and abdominal pains in the right lower quadrant.   She reported having leakage of stools but no urinary abnormalities.   Her past medical history is significant for hypercholesterolism, history of endometriosis, depression. Her past surgical history is significant for tonsillectomy, numerous laparoscopies for endometriosis as a younger woman, hysterectomy ("partial") in 07-23-1986, laparoscopic cholecystectomy in 1998-07-23, in 07/22/09 she had a parathyroidectomy, right hip replacement Jul 22, 2016.  She has never been pregnant.   Her family history is significant for a mother with lung cancer in 07/23/1982 and her father died from gastric cancer, both were smokers.   She lives with her husband who is of good health. 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    Ca1 25 was drawn on April 17, 2019 and was elevated at 288.  She underwent paracentesis for 3 L of peritoneal fluid on April 18, 2019 which revealed scattered malignant cells with immunostains consistent for a gynecologic primary, positive for cytokeratin 7, PAX 8, and WT 1.  She commenced chemotherapy on April 27, 2019 with carboplatin paclitaxel.  She received dosing every 21 days with day 1 of cycle 3 administered on June 11, 2019.  On day 1 of cycle 3 her CA-125 had decreased to 20.8.  She was tolerating chemotherapy extremely well and had minimal toxicities.  Follow-up CT scan of the abdomen and pelvis performed on 31st 2021 revealed good partial response to neoadjuvant chemotherapy with a reduction in the size of a complex cystic right adnexal mass to 6.6 cm, omental nodularity had largely resolved.  The ascites had resolved and there was no adenopathy present.  There were no metastatic disease in the chest identified.  Interval Hx:  On 07/03/19 she underwent robotic assisted BSO omentectomy radical tumor debulking to R0.  Intraoperative findings were significant for an excellent response to neoadjuvant chemotherapy.  There was a six 8 cm right ovarian mass densely adherent to the pelvic sidewall.  Normal appendix.  The left tube and ovary were free of obvious masses.  There was omental nodularity throughout the infracolic omentum.  At the completion of the procedure there was no gross macroscopic disease remaining.  Surgery was uncomplicated  as was her postoperative recovery.  Final pathology confirmed a carcinosarcoma of the right tube and ovary with high grade serous metastasis to the left tube and ovary predominantly on the surface and the omentum with the largest metastatic deposit measuring 4.3 cm.  She was confirmed a stage  IIIc.  Postoperatively she reported mild tenderness in her largest incision site.  She gradually resumed normal activity.  Her appetite was good and she was having daily bowel movements.  She is sleeping well.  She had some onset of symptoms concerning for a urinary tract infection on February 25 with bladder discomfort after voiding including spasms.  Current Meds:  Outpatient Encounter Medications as of 07/17/2019  Medication Sig  . Cholecalciferol (VITAMIN D-3 PO) Take 4,000 Units by mouth daily.   Marland Kitchen dexamethasone (DECADRON) 4 MG tablet Take 1 tablet (4 mg total) by mouth 2 (two) times daily.  Marland Kitchen enoxaparin (LOVENOX) 40 MG/0.4ML injection Inject 0.4 mLs (40 mg total) into the skin daily for 14 doses. For AFTER surgery only  . esomeprazole (NEXIUM) 40 MG capsule Take 1 daily in the morning (Patient taking differently: Take 40 mg by mouth every morning. )  . FLUoxetine (PROZAC) 20 MG capsule Take 1 capsule (20 mg total) by mouth daily.  Marland Kitchen gabapentin (NEURONTIN) 300 MG capsule Take 1 capsule (300 mg total) by mouth 3 (three) times daily. (Patient taking differently: Take 300 mg by mouth at bedtime. )  . lansoprazole (PREVACID) 15 MG capsule Take 15 mg by mouth at bedtime.  . lidocaine-prilocaine (EMLA) cream Apply to affected area once (Patient taking differently: Apply 1 application topically daily as needed (port access). )  . ondansetron (ZOFRAN-ODT) 8 MG disintegrating tablet Take 1 tablet (8 mg total) by mouth every 8 (eight) hours as needed for nausea or vomiting.  Marland Kitchen oxyCODONE (OXY IR/ROXICODONE) 5 MG immediate release tablet Take 1 tablet (5 mg total) by mouth every 4 (four) hours as needed for severe pain. For AFTER surgery only, do not take and drive  . prochlorperazine (COMPAZINE) 10 MG tablet Take 1 tablet (10 mg total) by mouth every 6 (six) hours as needed (Nausea or vomiting).  . simvastatin (ZOCOR) 40 MG tablet Take 1 tablet (40 mg total) by mouth at bedtime.  . [DISCONTINUED]  erythromycin base (E-MYCIN) 500 MG tablet Take 2 tablets at 2pm, 3pm, and 10 pm the day before surgery (Patient not taking: Reported on 07/17/2019)  . [DISCONTINUED] ibuprofen (ADVIL) 600 MG tablet Take 1 tablet (600 mg total) by mouth every 6 (six) hours as needed for moderate pain. For AFTER surgery only (Patient not taking: Reported on 07/17/2019)  . [DISCONTINUED] neomycin (MYCIFRADIN) 500 MG tablet Take 2 tablets at 2pm, 3pm, and 10 pm the day before surgery (Patient not taking: Reported on 07/17/2019)  . [DISCONTINUED] predniSONE (DELTASONE) 50 MG tablet Take 1 tablet 13 hours prior to scan, 1 tablet 7 hours prior to scan and 1 tablet 1 hour prior to scan. (Patient not taking: Reported on 06/19/2019)  . [DISCONTINUED] senna-docusate (SENOKOT-S) 8.6-50 MG tablet Take 2 tablets by mouth at bedtime. For AFTER surgery, do not take if having diarrhea (Patient not taking: Reported on 07/17/2019)   No facility-administered encounter medications on file as of 07/17/2019.    Allergy:  Allergies  Allergen Reactions  . Bee Venom Anaphylaxis  . Iodinated Diagnostic Agents Anaphylaxis    Other reaction(s): Other (See Comments) Other Reaction: CNS Disorder  . Amlodipine Swelling    also ineffective  . Demerol [  Meperidine Hcl] Nausea And Vomiting       . Lisinopril Cough  . Losartan     Bladder pain  . Iodine Other (See Comments)    Felt hot and flushed about 50 years ago after getting IVP dye.  No hives, no hypotension no sob. Likely normal reaction, given pretreatment protocol prior to discussion with patient of actual reaction.  NO reaction on CT  . Other Rash    Dermabond Surgical glue- rash, whelps    Social Hx:   Social History   Socioeconomic History  . Marital status: Married    Spouse name: Not on file  . Number of children: Not on file  . Years of education: Not on file  . Highest education level: Not on file  Occupational History  . 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 Smoking status: Never Smoker  . Smokeless tobacco: Never Used  Substance and Sexual Activity  . Alcohol use: Yes    Comment: Occasional  . Drug use: No  . Sexual activity: Not Currently  Other Topics Concern  . Not on file  Social History Narrative   Married   Gets regular exercise   Social Determinants of Health   Financial Resource Strain:   . Difficulty of Paying Living Expenses: Not on file  Food Insecurity:   . Worried About Charity fundraiser in the Last Year: Not on file  . Ran Out of Food in the Last Year: Not on file  Transportation Needs:   . Lack of Transportation (Medical): Not on file  . Lack of Transportation (Non-Medical): Not on file  Physical Activity:   . Days of Exercise per Week: Not on file  . Minutes of Exercise per Session: Not on file  Stress:   . Feeling of Stress : Not on file  Social Connections:   . Frequency of Communication with Friends and Family: Not on file  . Frequency of Social Gatherings with Friends and Family: Not on file  . Attends Religious Services: Not on file  . Active Member of Clubs or Organizations: Not on file  . Attends Archivist Meetings: Not on file  . Marital Status: Not on file  Intimate Partner Violence:   . Fear of Current or Ex-Partner: Not on file  . Emotionally Abused: Not on file  . Physically Abused: Not on file  . Sexually Abused: Not on file    Past Surgical Hx:  Past Surgical History:  Procedure Laterality Date  . DIAGNOSTIC LAPAROSCOPY    . IR IMAGING GUIDED PORT INSERTION  05/17/2019  . JOINT REPLACEMENT     right total hip   . LAPAROSCOPIC CHOLECYSTECTOMY    . PARACENTESIS    . PARATHYROIDECTOMY Right 2013  . PARTIAL HYSTERECTOMY    . TONSILLECTOMY AND ADENOIDECTOMY      Past Medical Hx:  Past Medical History:  Diagnosis Date  . #[id number] dx'd 03/2019  . Depression   . Dyspnea    with increased exertion   . Family history of  lung cancer   . Family history of stomach cancer   . Fatigue   . GERD (gastroesophageal reflux disease)   . Hyperlipidemia    H/o myalgias with Vytorin but tolerating simvastatin  . Hypertension   . Myalgia    Hx of   . S/P abdominal paracentesis 04/25/2019   pulled off 2.5 liters  .  S/P cholecystectomy   . S/P hysterectomy   . Shingles     Past Gynecological History:  Endometriosis, PCOS, P0, infertility (primary). No LMP recorded. Patient has had a hysterectomy.  Family Hx:  Family History  Problem Relation Age of Onset  . Lung cancer Mother   . Cancer Father 80       Stomach  . Heart failure Maternal Grandmother 80       CHF  . Heart failure Maternal Grandfather 50       Acute MI  . Cancer Paternal Aunt        either colon or liver    Review of Systems:  Constitutional  Feels fatigued, malaise  ENT Normal appearing ears and nares bilaterally Skin/Breast  No rash, sores, jaundice, itching, dryness Cardiovascular  No chest pain, shortness of breath, or edema  Pulmonary  No cough or wheeze.  Gastro Intestinal  no loose stools.  Genito Urinary  No frequency, urgency, dysuria,  Musculo Skeletal  No myalgia, arthralgia, joint swelling or pain  Neurologic  No weakness, numbness, change in gait,  Psychology  No depression, anxiety, insomnia.   Vitals:  Blood pressure (!) 167/92, pulse (!) 113, temperature 98.3 F (36.8 C), temperature source Temporal, resp. rate 17, height '5\' 7"'$  (1.702 m), weight 179 lb (81.2 kg), SpO2 100 %.  Physical Exam: WD in NAD Neck  Supple NROM, without any enlargements.  Lymph Node Survey No cervical supraclavicular or inguinal adenopathy Cardiovascular  Pulse normal rate, regularity and rhythm. S1 and S2 normal.  Lungs  Clear to auscultation bilateraly, without wheezes/crackles/rhonchi. Good air movement.  Skin  No rash/lesions/breakdown  Psychiatry  Alert and oriented to person, place, and time  Abdomen  Normoactive bowel  sounds, abdomen soft, non-tender and obese without evidence of hernia. No palpable masses. Well healed incisions. Back No CVA tenderness Genito Urinary  deferred Rectal  deferred Extremities  No bilateral cyanosis, clubbing or edema.   20 minutes of direct face to face counseling time was spent with the patient. This included discussion about prognosis, therapy recommendations and postoperative side effects and are beyond the scope of routine postoperative care.   Thereasa Solo, MD  07/17/2019, 3:32 PM

## 2019-07-17 NOTE — Patient Instructions (Signed)
Dr Denman George is clearing you to restart chemotherapy for 3 additional cycles. She will contact Dr Alvy Bimler to facilitate this scheduling.  Dr Denman George will see you back after completing chemotherapy to discuss entering surveillance care.

## 2019-07-18 ENCOUNTER — Telehealth: Payer: Self-pay | Admitting: Hematology and Oncology

## 2019-07-18 LAB — URINE CULTURE: Culture: <10000 — AB

## 2019-07-18 NOTE — Telephone Encounter (Signed)
Scheduled appt per 3/3 sch message - pt is aware of appt date and time   

## 2019-07-26 MED FILL — GABAPENTIN 300 MG CAPSULE: 300 | 30 days supply | Qty: 90 | Fill #1

## 2019-07-27 ENCOUNTER — Inpatient Hospital Stay (HOSPITAL_BASED_OUTPATIENT_CLINIC_OR_DEPARTMENT_OTHER): Payer: Medicare HMO | Admitting: Hematology and Oncology

## 2019-07-27 ENCOUNTER — Other Ambulatory Visit: Payer: Self-pay

## 2019-07-27 ENCOUNTER — Inpatient Hospital Stay: Payer: Medicare HMO

## 2019-07-27 ENCOUNTER — Encounter: Payer: Self-pay | Admitting: Hematology and Oncology

## 2019-07-27 VITALS — HR 95

## 2019-07-27 DIAGNOSIS — C786 Secondary malignant neoplasm of retroperitoneum and peritoneum: Secondary | ICD-10-CM

## 2019-07-27 DIAGNOSIS — R739 Hyperglycemia, unspecified: Secondary | ICD-10-CM | POA: Insufficient documentation

## 2019-07-27 DIAGNOSIS — C801 Malignant (primary) neoplasm, unspecified: Secondary | ICD-10-CM

## 2019-07-27 DIAGNOSIS — G620 Drug-induced polyneuropathy: Secondary | ICD-10-CM | POA: Diagnosis not present

## 2019-07-27 DIAGNOSIS — T451X5A Adverse effect of antineoplastic and immunosuppressive drugs, initial encounter: Secondary | ICD-10-CM

## 2019-07-27 DIAGNOSIS — T50905A Adverse effect of unspecified drugs, medicaments and biological substances, initial encounter: Secondary | ICD-10-CM

## 2019-07-27 DIAGNOSIS — Z5111 Encounter for antineoplastic chemotherapy: Secondary | ICD-10-CM | POA: Diagnosis not present

## 2019-07-27 LAB — CBC WITH DIFFERENTIAL (CANCER CENTER ONLY)
Abs Immature Granulocytes: 0.02 10*3/uL (ref 0.00–0.07)
Basophils Absolute: 0 10*3/uL (ref 0.0–0.1)
Basophils Relative: 0 %
Eosinophils Absolute: 0 10*3/uL (ref 0.0–0.5)
Eosinophils Relative: 0 %
HCT: 34.2 % — ABNORMAL LOW (ref 36.0–46.0)
Hemoglobin: 11.5 g/dL — ABNORMAL LOW (ref 12.0–15.0)
Immature Granulocytes: 0 %
Lymphocytes Relative: 13 %
Lymphs Abs: 1 10*3/uL (ref 0.7–4.0)
MCH: 31.4 pg (ref 26.0–34.0)
MCHC: 33.6 g/dL (ref 30.0–36.0)
MCV: 93.4 fL (ref 80.0–100.0)
Monocytes Absolute: 0.1 10*3/uL (ref 0.1–1.0)
Monocytes Relative: 2 %
Neutro Abs: 6.1 10*3/uL (ref 1.7–7.7)
Neutrophils Relative %: 85 %
Platelet Count: 397 10*3/uL (ref 150–400)
RBC: 3.66 MIL/uL — ABNORMAL LOW (ref 3.87–5.11)
RDW: 15.9 % — ABNORMAL HIGH (ref 11.5–15.5)
WBC Count: 7.3 10*3/uL (ref 4.0–10.5)
nRBC: 0 % (ref 0.0–0.2)

## 2019-07-27 LAB — CMP (CANCER CENTER ONLY)
ALT: 18 U/L (ref 0–44)
AST: 17 U/L (ref 15–41)
Albumin: 4.2 g/dL (ref 3.5–5.0)
Alkaline Phosphatase: 78 U/L (ref 38–126)
Anion gap: 11 (ref 5–15)
BUN: 19 mg/dL (ref 8–23)
CO2: 23 mmol/L (ref 22–32)
Calcium: 10.1 mg/dL (ref 8.9–10.3)
Chloride: 101 mmol/L (ref 98–111)
Creatinine: 0.87 mg/dL (ref 0.44–1.00)
GFR, Est AFR Am: >60 mL/min (ref >60)
GFR, Estimated: >60 mL/min (ref >60)
Glucose, Bld: 140 mg/dL — ABNORMAL HIGH (ref 70–99)
Potassium: 4 mmol/L (ref 3.5–5.1)
Sodium: 135 mmol/L (ref 135–145)
Total Bilirubin: 0.3 mg/dL (ref 0.3–1.2)
Total Protein: 7.9 g/dL (ref 6.5–8.1)

## 2019-07-27 MED ORDER — PALONOSETRON HCL INJECTION 0.25 MG/5ML
INTRAVENOUS | Status: AC
  Filled 2019-07-27: qty 5

## 2019-07-27 MED ORDER — SODIUM CHLORIDE 0.9 % IV SOLN
150.0000 mg | Freq: Once | INTRAVENOUS | Status: AC
  Administered 2019-07-27: 150 mg via INTRAVENOUS
  Filled 2019-07-27: qty 150

## 2019-07-27 MED ORDER — SODIUM CHLORIDE 0.9% FLUSH
10.0000 mL | INTRAVENOUS | Status: DC | PRN
  Administered 2019-07-27: 10 mL
  Filled 2019-07-27: qty 10

## 2019-07-27 MED ORDER — FAMOTIDINE IN NACL 20-0.9 MG/50ML-% IV SOLN
20.0000 mg | Freq: Once | INTRAVENOUS | Status: AC
  Administered 2019-07-27: 20 mg via INTRAVENOUS

## 2019-07-27 MED ORDER — HEPARIN SOD (PORK) LOCK FLUSH 100 UNIT/ML IV SOLN
500.0000 [IU] | Freq: Once | INTRAVENOUS | Status: AC | PRN
  Administered 2019-07-27: 500 [IU]
  Filled 2019-07-27: qty 5

## 2019-07-27 MED ORDER — DIPHENHYDRAMINE HCL 50 MG/ML IJ SOLN
25.0000 mg | Freq: Once | INTRAMUSCULAR | Status: AC
  Administered 2019-07-27: 25 mg via INTRAVENOUS

## 2019-07-27 MED ORDER — PALONOSETRON HCL INJECTION 0.25 MG/5ML
0.2500 mg | Freq: Once | INTRAVENOUS | Status: AC
  Administered 2019-07-27: 0.25 mg via INTRAVENOUS

## 2019-07-27 MED ORDER — SODIUM CHLORIDE 0.9 % IV SOLN
553.2000 mg | Freq: Once | INTRAVENOUS | Status: AC
  Administered 2019-07-27: 550 mg via INTRAVENOUS
  Filled 2019-07-27: qty 55

## 2019-07-27 MED ORDER — DEXAMETHASONE SODIUM PHOSPHATE 10 MG/ML IJ SOLN
10.0000 mg | Freq: Once | INTRAMUSCULAR | Status: AC
  Administered 2019-07-27: 10 mg via INTRAVENOUS

## 2019-07-27 MED ORDER — DEXAMETHASONE SODIUM PHOSPHATE 10 MG/ML IJ SOLN
INTRAMUSCULAR | Status: AC
  Filled 2019-07-27: qty 1

## 2019-07-27 MED ORDER — FAMOTIDINE IN NACL 20-0.9 MG/50ML-% IV SOLN
INTRAVENOUS | Status: AC
  Filled 2019-07-27: qty 50

## 2019-07-27 MED ORDER — SODIUM CHLORIDE 0.9 % IV SOLN
175.0000 mg/m2 | Freq: Once | INTRAVENOUS | Status: AC
  Administered 2019-07-27: 13:00:00 336 mg via INTRAVENOUS
  Filled 2019-07-27: qty 56

## 2019-07-27 MED ORDER — SODIUM CHLORIDE 0.9 % IV SOLN
Freq: Once | INTRAVENOUS | Status: AC
  Filled 2019-07-27: qty 250

## 2019-07-27 MED ORDER — DIPHENHYDRAMINE HCL 50 MG/ML IJ SOLN
INTRAMUSCULAR | Status: AC
  Filled 2019-07-27: qty 1

## 2019-07-27 NOTE — Patient Instructions (Signed)
   [facility] Discharge Instructions for Patients Receiving Chemotherapy  Today you received the following chemotherapy agents Taxol and Carboplatin   To help prevent nausea and vomiting after your treatment, we encourage you to take your nausea medication as directed.    If you develop nausea and vomiting that is not controlled by your nausea medication, call the clinic.   BELOW ARE SYMPTOMS THAT SHOULD BE REPORTED IMMEDIATELY:  *FEVER GREATER THAN 100.5 F  *CHILLS WITH OR WITHOUT FEVER  NAUSEA AND VOMITING THAT IS NOT CONTROLLED WITH YOUR NAUSEA MEDICATION  *UNUSUAL SHORTNESS OF BREATH  *UNUSUAL BRUISING OR BLEEDING  TENDERNESS IN MOUTH AND THROAT WITH OR WITHOUT PRESENCE OF ULCERS  *URINARY PROBLEMS  *BOWEL PROBLEMS  UNUSUAL RASH Items with * indicate a potential emergency and should be followed up as soon as possible.  Feel free to call the clinic should you have any questions or concerns. The clinic phone number is [phone].  Please show the CHEMO ALERT CARD at check-in to the Emergency Department and triage nurse.   

## 2019-07-27 NOTE — Assessment & Plan Note (Signed)
She has elevated blood sugar due to steroid treatment We discussed the importance of dietary modification and lifestyle changes while on treatment

## 2019-07-27 NOTE — Assessment & Plan Note (Signed)
Her neuropathy has improved since she took a break for surgery She will continue gabapentin for now We will continue with previous dose

## 2019-07-27 NOTE — Progress Notes (Signed)
[place] OFFICE PROGRESS NOTE  Patient Care Team: Larene Beach, MD as PCP - General (Family Medicine) Awanda Mink Craige Cotta, RN as Oncology Nurse Navigator (Oncology)  ASSESSMENT & PLAN:  Peritoneal carcinomatosis [facility]) She has excellent response to neoadjuvant chemotherapy and successful optimal debulking surgery The plan would be to proceed with 3 more cycles of carboplatin and Taxol followed by repeat imaging study We will also follow-up on results of genetic testing I will see her back again in 3 weeks for further follow-up  Peripheral neuropathy due to chemotherapy [facility]) Her neuropathy has improved since she took a break for surgery She will continue gabapentin for now We will continue with previous dose   Drug-induced hyperglycemia She has elevated blood sugar due to steroid treatment We discussed the importance of dietary modification and lifestyle changes while on treatment   No orders of the defined types were placed in this encounter.   All questions were answered. The patient knows to call the clinic with any problems, questions or concerns. The total time spent in the appointment was 20 minutes encounter with patients including review of chart and various tests results, discussions about plan of care and coordination of care plan   [name], MD 07/27/2019 4:46 PM  INTERVAL HISTORY: Please see below for problem oriented charting. She returns to resume chemotherapy after recent interval debulking surgery She is doing well No concerns for wound infection Her appetite is fair Her previous neuropathy has improved She continues taking gabapentin as prescribed  SUMMARY OF ONCOLOGIC HISTORY: Oncology History  Peritoneal carcinomatosis ([place])  01/16/2019 Initial Diagnosis   She had vague symptom of dyspareunia. Presented to ER for evaluation in November for abdominal pain and vague symptom of fullness and imaging showed abnormalities   04/16/2019 Imaging    1. Nonvisualization of the appendix. However, there is a large volume of ascites within the abdomen and pelvis which appears partially loculated. In the acute setting findings may be the sequelae of peritonitis. However, there are multiple partially calcified peritoneal nodules throughout the omentum which are concerning for peritoneal carcinomatosis. Noncalcified nodule in the left lower quadrant peritoneal reflection is noted. Further investigation with diagnostic paracentesis is advised. 2. Ill-defined low-density mass within the right iliac fossa is suspected and may represent an ovarian neoplasm. 3. Aortic atherosclerosis. 4. Small bilateral pleural effusions. 5. Hiatal hernia. 6. Prior granulomatous disease.   Aortic Atherosclerosis (ICD10-I70.0).   04/17/2019 Tumor Marker   Patient's tumor was tested for the following markers: CA-125 Results of the tumor marker test revealed 288.   04/18/2019 Procedure   Successful ultrasound-guided diagnostic and therapeutic paracentesis yielding 3.2 liters of peritoneal fluid.   04/18/2019 Pathology Results   FINAL MICROSCOPIC DIAGNOSIS:  - Malignant cells present  - See comment   SPECIMEN ADEQUACY:  Satisfactory for evaluation   DIAGNOSTIC COMMENTS:  The cell block has scattered malignant cells.  These atypical cells are positive for cytokeraitn 7, MOC-31, Pax8, and WT-1 but negative for TTF-1 and cytokeratin 20.  Overall, the features are consistent with a primary gynecologic neoplasm   04/20/2019 Cancer Staging   Staging form: Ovary, Fallopian Tube, and Primary Peritoneal Carcinoma, AJCC 8th Edition - Clinical stage from 04/20/2019: FIGO Stage IIIC (cT3c, cN0, cM0) - Signed by [name], MD on 04/20/2019   04/25/2019 Procedure   Successful ultrasound-guided therapeutic paracentesis yielding 2.5 liters of peritoneal fluid.     04/27/2019 -  Chemotherapy   The patient had carboplatin and taxol x 3 cycles for  neoadjuvant chemotherapy  treatment, followed by interval debulking surgery and 3 more cycles of chemotherapy     05/17/2019 Procedure   Placement of a subcutaneous port device. Catheter tip at the SVC and right atrium junction.   06/11/2019 Tumor Marker   Patient's tumor was tested for the following markers: CA-125 Results of the tumor marker test revealed 20.8   06/18/2019 Imaging   CT chest, abdomen and pelvis 1. Positive response to therapy. Complex 6.6 cm cystic right adnexal mass is decreased in size. Omental nodularity has largely resolved. Ascites has resolved. No adenopathy. 2. No evidence of metastatic disease in the chest. 3. Chronic findings include: Aortic Atherosclerosis (ICD10-I70.0). Small hiatal hernia. Mild sigmoid diverticulosis.   07/03/2019 Surgery   Preoperative Diagnosis: ovarian cancer stage IIIC     Procedure(s) Performed: Robotic-assisted laparoscopic bilateral salpingo-oophorectomy, omentectomy, radical tumor debulking with mini-laparotomy. Optimal cytoreduction with no gross residual disease remaining.    Surgeon: Everitt Amber, M.D. Specimens: Bilateral ovaries, fallopian tubes, omentum    Indication for Procedure:  Patient has a history of stage IIIC ovarian cancer, s/p 3 cycles of neoadjuvant chemotherapy with good partial clinical response. Right ovarian mass on imaging and omental caking.   Operative Findings: 6-8cm right ovarian mass densely adherent to pelvic sidewall with filmy adhesions to colonic epiploica but not bowel wall. Normal appendix. Slightly enlarged left tube and ovary but without discrete mass. Omental nodularity throughout infracolic omentum. Tiny miliary <42mm nodules on left diaphragm.    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) Histologic Grade: High-grade Other Tissue/ Organ Involvement: Left ovary and omentum Largest Extrapelvic Peritoneal Focus: 4.3 cm tumor deposit in the omentum Peritoneal/Ascitic Fluid: Positive for carcinoma [id number]) Treatment Effect: Moderate response identified (CRS 2) Regional Lymph Nodes: No lymph nodes submitted or found Pathologic Stage Classification (pTNM, AJCC 8th Edition): pT3c, pNx Representative Tumor Block: B10 Comment(s): Only the serous component shows metastasis to the omentum and spread to left ovary and right fallopian tube     REVIEW OF SYSTEMS:   Constitutional: Denies fevers, chills or abnormal weight loss Eyes: Denies blurriness of vision Ears, nose, mouth, throat, and face: Denies mucositis or sore throat Respiratory: Denies cough, dyspnea or wheezes Cardiovascular: Denies palpitation, chest discomfort or lower extremity swelling Gastrointestinal:  Denies nausea, heartburn or change in bowel habits Skin: Denies abnormal skin rashes Lymphatics: Denies new lymphadenopathy or easy bruising Behavioral/Psych: Mood is stable, no new changes  All other systems were reviewed with the patient and are negative.  I have reviewed the past medical history, past surgical history, social history and family history with the patient and they are unchanged from previous note.  ALLERGIES:  is allergic to bee venom; iodinated diagnostic agents; amlodipine; demerol [meperidine hcl]; lisinopril; losartan; iodine; and other.  MEDICATIONS:  Current Outpatient Medications  Medication Sig Dispense Refill  . Cholecalciferol (VITAMIN D-3 PO) Take 4,000 Units by mouth daily.     Marland Kitchen dexamethasone (DECADRON) 4 MG tablet Take 1 tablet (4 mg total) by mouth 2 (two) times daily. 14 tablet 0  . esomeprazole (NEXIUM) 40 MG capsule Take 1 daily in the  morning (Patient taking differently: Take 40 mg by mouth every morning. ) 30 capsule 3  . FLUoxetine (PROZAC) 20  MG capsule Take 1 capsule (20 mg total) by mouth daily. 90 capsule 3  . gabapentin (NEURONTIN) 300 MG capsule Take 1 capsule (300 mg total) by mouth 3 (three) times daily. (Patient taking differently: Take 300 mg by mouth at bedtime. ) 90 capsule 3  . lansoprazole (PREVACID) 15 MG capsule Take 15 mg by mouth at bedtime.    . lidocaine-prilocaine (EMLA) cream Apply to affected area once (Patient taking differently: Apply 1 application topically daily as needed (port access). ) 30 g 3  . ondansetron (ZOFRAN-ODT) 8 MG disintegrating tablet Take 1 tablet (8 mg total) by mouth every 8 (eight) hours as needed for nausea or vomiting. 30 tablet 1  . oxyCODONE (OXY IR/ROXICODONE) 5 MG immediate release tablet Take 1 tablet (5 mg total) by mouth every 4 (four) hours as needed for severe pain. For AFTER surgery only, do not take and drive 15 tablet 0  . prochlorperazine (COMPAZINE) 10 MG tablet Take 1 tablet (10 mg total) by mouth every 6 (six) hours as needed (Nausea or vomiting). 30 tablet 1  . simvastatin (ZOCOR) 40 MG tablet Take 1 tablet (40 mg total) by mouth at bedtime. 90 tablet 4   No current facility-administered medications for this visit.   Facility-Administered Medications Ordered in Other Visits  Medication Dose Route Frequency Provider Last Rate Last Admin  . heparin lock flush 100 unit/mL  500 Units Intracatheter Once PRN Alvy Bimler, Dessa Ledee, MD      . sodium chloride flush (NS) 0.9 % injection 10 mL  10 mL Intracatheter PRN Alvy Bimler, Thy Gullikson, MD        PHYSICAL EXAMINATION: ECOG PERFORMANCE STATUS: 1 - Symptomatic but completely ambulatory  Vitals:   07/27/19 1034  BP: (!) 145/85  Pulse: (!) 120  Resp: 18  Temp: 98.6 F (37 C)  SpO2: 99%   Filed Weights   07/27/19 1034  Weight: 175 lb 3.2 oz (79.5 kg)    GENERAL:alert, no distress and comfortable ABDOMEN: Noted well-healed  surgical scar Musculoskeletal:no cyanosis of digits and no clubbing  NEURO: alert & oriented x 3 with fluent speech, no focal motor/sensory deficits  LABORATORY DATA:  I have reviewed the data as listed    Component Value Date/Time   NA 135 07/27/2019 1004   NA 137 10/27/2011 0738   K 4.0 07/27/2019 1004   K 4.1 10/27/2011 0738   CL 101 07/27/2019 1004   CL 105 10/27/2011 0738   CO2 23 07/27/2019 1004   CO2 26 10/27/2011 0738   GLUCOSE 140 (H) 07/27/2019 1004   GLUCOSE 111 (H) 10/27/2011 0738   BUN 19 07/27/2019 1004   BUN 15 10/27/2011 0738   CREATININE 0.87 07/27/2019 1004   CREATININE 1.01 (H) 12/16/2015 1030   CALCIUM 10.1 07/27/2019 1004   CALCIUM 10.0 09/28/2013 0000   PROT 7.9 07/27/2019 1004   PROT 8.1 10/27/2011 0738   ALBUMIN 4.2 07/27/2019 1004   ALBUMIN 3.9 10/27/2011 0738   AST 17 07/27/2019 1004   ALT 18 07/27/2019 1004   ALT 29 10/27/2011 0738   ALKPHOS 78 07/27/2019 1004   ALKPHOS 102 10/27/2011 0738   BILITOT 0.3 07/27/2019 1004   GFRNONAA >60 07/27/2019 1004   GFRNONAA 59 (L) 12/16/2015 1030   GFRAA >60 07/27/2019 1004   GFRAA 68 12/16/2015 1030    No results found for: SPEP, UPEP  Lab Results  Component Value Date   WBC 7.3 07/27/2019   NEUTROABS 6.1 07/27/2019   HGB 11.5 (L) 07/27/2019  HCT 34.2 (L) 07/27/2019   MCV 93.4 07/27/2019   PLT 397 07/27/2019      Chemistry      Component Value Date/Time   NA 135 07/27/2019 1004   NA 137 10/27/2011 0738   K 4.0 07/27/2019 1004   K 4.1 10/27/2011 0738   CL 101 07/27/2019 1004   CL 105 10/27/2011 0738   CO2 23 07/27/2019 1004   CO2 26 10/27/2011 0738   BUN 19 07/27/2019 1004   BUN 15 10/27/2011 0738   CREATININE 0.87 07/27/2019 1004   CREATININE 1.01 (H) 12/16/2015 1030   GLU 98 09/28/2013 0000      Component Value Date/Time   CALCIUM 10.1 07/27/2019 1004   CALCIUM 10.0 09/28/2013 0000   ALKPHOS 78 07/27/2019 1004   ALKPHOS 102 10/27/2011 0738   AST 17 07/27/2019 1004   ALT 18  07/27/2019 1004   ALT 29 10/27/2011 0738   BILITOT 0.3 07/27/2019 1004

## 2019-07-27 NOTE — Assessment & Plan Note (Signed)
She has excellent response to neoadjuvant chemotherapy and successful optimal debulking surgery The plan would be to proceed with 3 more cycles of carboplatin and Taxol followed by repeat imaging study We will also follow-up on results of genetic testing I will see her back again in 3 weeks for further follow-up

## 2019-07-27 NOTE — Progress Notes (Signed)
Proceed with treatment today per Juliann Pulse in setting of pending PA. 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  Demetrius Charity, PharmD, [place] Oncology Pharmacist Pharmacy Phone: [phone] 07/27/2019

## 2019-07-28 LAB — CA 125: Cancer Antigen (CA) 125: 13.7 U/mL (ref 0.0–38.1)

## 2019-08-02 ENCOUNTER — Telehealth: Payer: Self-pay | Admitting: Hematology and Oncology

## 2019-08-02 NOTE — Telephone Encounter (Signed)
Scheduled per 03/12 scheduled message, patient has been called and notified. 

## 2019-08-06 ENCOUNTER — Other Ambulatory Visit: Payer: Self-pay | Admitting: Oncology

## 2019-08-06 NOTE — Progress Notes (Signed)
Gynecologic Oncology Multi-Disciplinary Disposition Conference Note  Date of the Conference: 08/06/2019  Patient Name: Connie Heath  Primary GYN Oncologist: Dr. 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 Disposition is to 3 more cycles of chemotherapy. CT chest/abd/pelvis imaging recommended for follow up after chemotherapy.   This Multidisciplinary conference took place involving physicians from [place], [place], Radiation Oncology, Pathology, Radiology along with the Gynecologic Oncology Nurse Practitioner and RN.  Comprehensive assessment of the patient's malignancy, staging, need for surgery, chemotherapy, radiation therapy, and need for further testing were reviewed. Supportive measures, both inpatient and following discharge were also discussed. The recommended plan of care is documented. Greater than 35 minutes were spent correlating and coordinating this patient's care.

## 2019-08-13 NOTE — Progress Notes (Signed)
Pharmacist Chemotherapy Monitoring - Follow Up Assessment    I verify that I have reviewed each item in the below checklist:  . Regimen for the patient is scheduled for the appropriate day and plan matches scheduled date. Marland Kitchen Appropriate non-routine labs are ordered dependent on drug ordered. . If applicable, additional medications reviewed and ordered per protocol based on lifetime cumulative doses and/or treatment regimen.   Plan for follow-up and/or issues identified: No . I-vent associated with next due treatment: No . MD and/or nursing notified: No  Connie Heath D 08/13/2019 2:50 PM

## 2019-08-14 ENCOUNTER — Encounter: Payer: Self-pay | Admitting: Licensed Clinical Social Worker

## 2019-08-14 ENCOUNTER — Ambulatory Visit: Payer: Self-pay | Admitting: Licensed Clinical Social Worker

## 2019-08-14 ENCOUNTER — Telehealth: Payer: Self-pay | Admitting: Licensed Clinical Social Worker

## 2019-08-14 DIAGNOSIS — Z801 Family history of malignant neoplasm of trachea, bronchus and lung: Secondary | ICD-10-CM

## 2019-08-14 DIAGNOSIS — Z1379 Encounter for other screening for genetic and chromosomal anomalies: Secondary | ICD-10-CM | POA: Insufficient documentation

## 2019-08-14 DIAGNOSIS — Z800 Family history of malignant neoplasm of digestive organs: Secondary | ICD-10-CM

## 2019-08-14 DIAGNOSIS — C786 Secondary malignant neoplasm of retroperitoneum and peritoneum: Secondary | ICD-10-CM

## 2019-08-14 NOTE — Progress Notes (Signed)
HPI:  Ms. Lund was previously seen in the [facility] due to a personal and family history of cancer and concerns regarding a hereditary predisposition to cancer. Please refer to our prior cancer genetics clinic note for more information regarding our discussion, assessment and recommendations, at the time. Ms. Tufaro recent genetic test results were disclosed to her, as were recommendations warranted by these results. These results and recommendations are discussed in more detail below.  CANCER HISTORY:  Oncology History  Peritoneal carcinomatosis ([place])  01/16/2019 Initial Diagnosis   She had vague symptom of dyspareunia. Presented to ER for evaluation in November for abdominal pain and vague symptom of fullness and imaging showed abnormalities   04/16/2019 Imaging   1. Nonvisualization of the appendix. However, there is a large volume of ascites within the abdomen and pelvis which appears partially loculated. In the acute setting findings may be the sequelae of peritonitis. However, there are multiple partially calcified peritoneal nodules throughout the omentum which are concerning for peritoneal carcinomatosis. Noncalcified nodule in the left lower quadrant peritoneal reflection is noted. Further investigation with diagnostic paracentesis is advised. 2. Ill-defined low-density mass within the right iliac fossa is suspected and may represent an ovarian neoplasm. 3. Aortic atherosclerosis. 4. Small bilateral pleural effusions. 5. Hiatal hernia. 6. Prior granulomatous disease.   Aortic Atherosclerosis (ICD10-I70.0).   04/17/2019 Tumor Marker   Patient's tumor was tested for the following markers: CA-125 Results of the tumor marker test revealed 288.   04/18/2019 Procedure   Successful ultrasound-guided diagnostic and therapeutic paracentesis yielding 3.2 liters of peritoneal fluid.   04/18/2019 Pathology Results   FINAL MICROSCOPIC DIAGNOSIS:  - Malignant cells  present  - See comment   SPECIMEN ADEQUACY:  Satisfactory for evaluation   DIAGNOSTIC COMMENTS:  The cell block has scattered malignant cells.  These atypical cells are positive for cytokeraitn 7, MOC-31, Pax8, and WT-1 but negative for TTF-1 and cytokeratin 20.  Overall, the features are consistent with a primary gynecologic neoplasm   04/20/2019 Cancer Staging   Staging form: Ovary, Fallopian Tube, and Primary Peritoneal Carcinoma, AJCC 8th Edition - Clinical stage from 04/20/2019: FIGO Stage IIIC (cT3c, cN0, cM0) - Signed by [name], MD on 04/20/2019   04/25/2019 Procedure   Successful ultrasound-guided therapeutic paracentesis yielding 2.5 liters of peritoneal fluid.     04/27/2019 -  Chemotherapy   The patient had carboplatin and taxol x 3 cycles for neoadjuvant chemotherapy treatment, followed by interval debulking surgery and 3 more cycles of chemotherapy     05/17/2019 Procedure   Placement of a subcutaneous port device. Catheter tip at the SVC and right atrium junction.   06/11/2019 Tumor Marker   Patient's tumor was tested for the following markers: CA-125 Results of the tumor marker test revealed 20.8   06/18/2019 Imaging   CT chest, abdomen and pelvis 1. Positive response to therapy. Complex 6.6 cm cystic right adnexal mass is decreased in size. Omental nodularity has largely resolved. Ascites has resolved. No adenopathy. 2. No evidence of metastatic disease in the chest. 3. Chronic findings include: Aortic Atherosclerosis (ICD10-I70.0). Small hiatal hernia. Mild sigmoid diverticulosis.   07/03/2019 Surgery   Preoperative Diagnosis: ovarian cancer stage IIIC     Procedure(s) Performed: Robotic-assisted laparoscopic bilateral salpingo-oophorectomy, omentectomy, radical tumor debulking with mini-laparotomy. Optimal cytoreduction with no gross residual disease remaining.    Surgeon: Everitt Amber, M.D. Specimens: Bilateral ovaries, fallopian tubes, omentum    Indication  for Procedure:  Patient has a history of  stage IIIC ovarian cancer, s/p 3 cycles of neoadjuvant chemotherapy with good partial clinical response. Right ovarian mass on imaging and omental caking.   Operative Findings: 6-8cm right ovarian mass densely adherent to pelvic sidewall with filmy adhesions to colonic epiploica but not bowel wall. Normal appendix. Slightly enlarged left tube and ovary but without discrete mass. Omental nodularity throughout infracolic omentum. Tiny miliary <82m nodules on left diaphragm.    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 Other Tissue/ Organ Involvement: Left ovary and omentum Largest Extrapelvic Peritoneal Focus: 4.3 cm tumor deposit in the omentum Peritoneal/Ascitic Fluid: Positive for carcinoma (([id number] Treatment Effect: Moderate response identified (CRS 2) Regional Lymph Nodes: No lymph nodes submitted or found Pathologic Stage Classification (pTNM, AJCC 8th Edition): pT3c, pNx Representative Tumor Block: B10 Comment(s): Only the serous component shows metastasis to the omentum and spread to left ovary and right fallopian tube   07/27/2019 Tumor Marker   Patient's tumor was tested for the following markers: CA-125 Results of the tumor marker test revealed 13.7   08/14/2019 Genetic Testing   Negative germline + somatic testing. No pathogenic variants identified on the [employer] VUS in MRE11A identified in the germline.  The report date is 08/14/2019.   The CancerNext gene panel offered by [employer] sequencing  and rearrangement analysis for the following 36 genes: APC*, ATM*, AXIN2, BARD1, BMPR1A, BRCA1*, BRCA2*, BRIP1*, CDH1*, CDK4, CDKN2A, CHEK2*, DICER1, MLH1*, MSH2*, MSH3, MSH6*, MUTYH*, NBN, NF1*, NTHL1, PALB2*, PMS2*, PTEN*, RAD51C*, RAD51D*, RECQL, SMAD4, SMARCA4, STK11 and TP53* (sequencing and deletion/duplication); HOXB13, POLD1 and POLE (sequencing only); EPCAM and GREM1 (deletion/duplication only).   Somatic genes analyzed through TumorNext-HRD: ATM, BARD1, BRCA1, BRCA2, BRIP1, CHEK2, MRE11A, NBN, PALB2, RAD51C, RAD51D.       FAMILY HISTORY:  We obtained a detailed, 4-generation family history.  Significant diagnoses are listed below: Family History  Problem Relation Age of Onset  . Lung cancer Mother   . Cancer Father 564      Stomach  . Heart failure Maternal Grandmother 80       CHF  . Heart failure Maternal Grandfather 50       Acute MI  . Cancer Paternal Aunt        either colon or liver   Ms. WWooldridgedoes not have children. She does not have siblings.  Ms. WWeltemother died of lung cancer at age 2872and had history of smoking. Patient's mother did have one brother but he died in infancy due to a heart condition, therefore patient does not have maternal first cousins. Maternal grandmother died at 833 grandfather died around age 68 no cancers.  Ms. WBurbagefather was diagnosed with gastric cancer at 583and died at 564 Patient had 2 paternal aunts. One aunt had either colon or liver cancer and died in her 685s The other aunt did not have cancer and died in her 747s No known cancers in paternal cousins. Paternal grandmother died in her 725s grandfather died in his late 655sdue to stroke.  Ms. WArreagais unaware of previous family history of genetic testing for hereditary cancer risks. Patient's maternal ancestors are of [place], and paternal ancestors are of SFurniture conservator/restorer EScientist, forensic  descent. There is no reported Ashkenazi Jewish ancestry. There is no known  consanguinity.  GENETIC TEST RESULTS: Genetic testing reported out on 08/14/2019 through the [employer] TumorNext-HRD+ CancerNext cancer panel found no pathogenic mutations.   The CancerNext gene panel offered by [employer] includes sequencing and rearrangement analysis for the following 36 genes: APC*, ATM*, AXIN2, BARD1, BMPR1A, BRCA1*, BRCA2*, BRIP1*, CDH1*, CDK4, CDKN2A, CHEK2*, DICER1, MLH1*, MSH2*, MSH3, MSH6*, MUTYH*, NBN, NF1*, NTHL1, PALB2*, PMS2*, PTEN*, RAD51C*, RAD51D*, RECQL, SMAD4, SMARCA4, STK11 and TP53* (sequencing and deletion/duplication); HOXB13, POLD1 and POLE (sequencing only); EPCAM and GREM1 (deletion/duplication only).   Somatic genes analyzed through TumorNext-HRD: ATM, BARD1, BRCA1, BRCA2, BRIP1, CHEK2, MRE11A, NBN, PALB2, RAD51C, RAD51D.  The test report has been scanned into EPIC and is located under the Molecular Pathology section of the Results Review tab.  A portion of the result report is included below for reference.      We discussed with Ms. Gomer that because current genetic testing is not perfect, it is possible there may be a gene mutation in one of these genes that current testing cannot detect, but that chance is small.  We also discussed, that there could be another gene that has not yet been discovered, or that we have not yet tested, that is responsible for the cancer diagnoses in the family. It is also possible there is a hereditary cause for the cancer in the family that Ms. Latulippe did not inherit and therefore was not identified in her testing.  Therefore, it is important to remain in touch with cancer genetics in the future so that we can continue to offer Ms. Mayson the most up to date genetic testing.   Genetic testing did identify a variant of uncertain significance (VUS) in the MRE11A gene  At this time, it is unknown if this variant is associated with increased cancer risk or if this is a normal finding, but most variants such as this get  reclassified to being inconsequential. It should not be used to make medical management decisions. With time, we suspect the lab will determine the significance of this variant, if any. If we do learn more about it, we will try to contact Ms. Impastato to discuss it further. However, it is important to stay in touch with [place] periodically and keep the address and phone number up to date.  ADDITIONAL GENETIC TESTING: We discussed with Ms. Scinto that her genetic testing was fairly extensive.  If there are genes identified to increase cancer risk that can be analyzed in the future, we would be happy to discuss and coordinate this testing at that time.    CANCER SCREENING RECOMMENDATIONS: Ms. Rochette test result is considered negative (normal).  This means that we have not identified a hereditary cause for her  personal and family history of cancer at this time. Most cancers happen by chance and this negative test suggests that her cancer may fall into this category.    While reassuring, this does not definitively rule out a hereditary predisposition to cancer. It is still possible that there could be genetic mutations that are undetectable by current technology. There could be genetic mutations in genes that have not been tested or identified to increase cancer risk.  Therefore, it is recommended she continue to follow the cancer management and screening guidelines provided by her oncology and primary healthcare provider.   An individual's cancer risk and medical management are not determined by genetic test results alone. Overall cancer risk assessment incorporates additional  factors, including personal medical history, family history, and any available genetic information that may result in a personalized plan for cancer prevention and surveillance.  RECOMMENDATIONS FOR FAMILY MEMBERS:  Relatives in this family might be at some increased risk of developing cancer, over the general population risk, simply due  to the family history of cancer.  We recommended female relatives in this family have a yearly mammogram beginning at age 53, or 41 years younger than the earliest onset of cancer, an annual clinical breast exam, and perform monthly breast self-exams. Female relatives in this family should also have a gynecological exam as recommended by their primary provider. All family members should have a colonoscopy by age 23, or as directed by their physicians.  FOLLOW-UP: Lastly, we discussed with Ms. Byington that cancer genetics is a rapidly advancing field and it is possible that new genetic tests will be appropriate for her and/or her family members in the future. We encouraged her to remain in contact with cancer genetics on an annual basis so we can update her personal and family histories and let her know of advances in cancer genetics that may benefit this family.   Our contact number was provided. Ms. Upson questions were answered to her satisfaction, and she knows she is welcome to call us at anytime with additional questions or concerns.   Faith Rogue, MS, [facility] Genetic Counselor [email or web address] Phone: [phone]

## 2019-08-14 NOTE — Telephone Encounter (Signed)
Revealed negative germline and somatic genetic testing.  Revealed that a VUS in MRE11A was identified through the germline panel.  We discussed that we do not know why she has cancer or why there is cancer in the family. It could be due to a different gene that we are not testing, or something our current technology cannot pick up.  It will be important for her to keep in contact with genetics to learn if additional testing may be needed in the future.

## 2019-08-17 ENCOUNTER — Inpatient Hospital Stay (HOSPITAL_BASED_OUTPATIENT_CLINIC_OR_DEPARTMENT_OTHER): Payer: Medicare HMO | Admitting: Hematology and Oncology

## 2019-08-17 ENCOUNTER — Other Ambulatory Visit: Payer: Self-pay

## 2019-08-17 ENCOUNTER — Inpatient Hospital Stay: Payer: Medicare HMO | Attending: Gynecologic Oncology

## 2019-08-17 ENCOUNTER — Inpatient Hospital Stay: Payer: Medicare HMO

## 2019-08-17 ENCOUNTER — Telehealth: Payer: Self-pay | Admitting: Hematology and Oncology

## 2019-08-17 ENCOUNTER — Encounter: Payer: Self-pay | Admitting: Hematology and Oncology

## 2019-08-17 VITALS — HR 90

## 2019-08-17 VITALS — BP 131/55 | HR 104 | Temp 98.2°F | Resp 18 | Ht 67.0 in | Wt 178.6 lb

## 2019-08-17 DIAGNOSIS — T451X5A Adverse effect of antineoplastic and immunosuppressive drugs, initial encounter: Secondary | ICD-10-CM | POA: Diagnosis not present

## 2019-08-17 DIAGNOSIS — C561 Malignant neoplasm of right ovary: Secondary | ICD-10-CM | POA: Diagnosis not present

## 2019-08-17 DIAGNOSIS — M2550 Pain in unspecified joint: Secondary | ICD-10-CM | POA: Insufficient documentation

## 2019-08-17 DIAGNOSIS — K5900 Constipation, unspecified: Secondary | ICD-10-CM | POA: Diagnosis not present

## 2019-08-17 DIAGNOSIS — Z79899 Other long term (current) drug therapy: Secondary | ICD-10-CM | POA: Insufficient documentation

## 2019-08-17 DIAGNOSIS — Z7952 Long term (current) use of systemic steroids: Secondary | ICD-10-CM | POA: Diagnosis not present

## 2019-08-17 DIAGNOSIS — R110 Nausea: Secondary | ICD-10-CM | POA: Diagnosis not present

## 2019-08-17 DIAGNOSIS — Z5111 Encounter for antineoplastic chemotherapy: Secondary | ICD-10-CM | POA: Diagnosis not present

## 2019-08-17 DIAGNOSIS — J90 Pleural effusion, not elsewhere classified: Secondary | ICD-10-CM | POA: Diagnosis not present

## 2019-08-17 DIAGNOSIS — Z885 Allergy status to narcotic agent status: Secondary | ICD-10-CM | POA: Diagnosis not present

## 2019-08-17 DIAGNOSIS — G893 Neoplasm related pain (acute) (chronic): Secondary | ICD-10-CM

## 2019-08-17 DIAGNOSIS — N952 Postmenopausal atrophic vaginitis: Secondary | ICD-10-CM | POA: Diagnosis not present

## 2019-08-17 DIAGNOSIS — R350 Frequency of micturition: Secondary | ICD-10-CM | POA: Diagnosis not present

## 2019-08-17 DIAGNOSIS — R197 Diarrhea, unspecified: Secondary | ICD-10-CM | POA: Insufficient documentation

## 2019-08-17 DIAGNOSIS — G620 Drug-induced polyneuropathy: Secondary | ICD-10-CM | POA: Insufficient documentation

## 2019-08-17 DIAGNOSIS — I700 Atherosclerosis of aorta: Secondary | ICD-10-CM | POA: Insufficient documentation

## 2019-08-17 DIAGNOSIS — C786 Secondary malignant neoplasm of retroperitoneum and peritoneum: Secondary | ICD-10-CM

## 2019-08-17 LAB — CBC WITH DIFFERENTIAL (CANCER CENTER ONLY)
Abs Immature Granulocytes: 0.09 10*3/uL — ABNORMAL HIGH (ref 0.00–0.07)
Basophils Absolute: 0 10*3/uL (ref 0.0–0.1)
Basophils Relative: 0 %
Eosinophils Absolute: 0 10*3/uL (ref 0.0–0.5)
Eosinophils Relative: 0 %
HCT: 34.5 % — ABNORMAL LOW (ref 36.0–46.0)
Hemoglobin: 11.5 g/dL — ABNORMAL LOW (ref 12.0–15.0)
Immature Granulocytes: 1 %
Lymphocytes Relative: 14 %
Lymphs Abs: 1.2 10*3/uL (ref 0.7–4.0)
MCH: 31.5 pg (ref 26.0–34.0)
MCHC: 33.3 g/dL (ref 30.0–36.0)
MCV: 94.5 fL (ref 80.0–100.0)
Monocytes Absolute: 0.2 10*3/uL (ref 0.1–1.0)
Monocytes Relative: 2 %
Neutro Abs: 7 10*3/uL (ref 1.7–7.7)
Neutrophils Relative %: 83 %
Platelet Count: 358 10*3/uL (ref 150–400)
RBC: 3.65 MIL/uL — ABNORMAL LOW (ref 3.87–5.11)
RDW: 14.5 % (ref 11.5–15.5)
WBC Count: 8.5 10*3/uL (ref 4.0–10.5)
nRBC: 0 % (ref 0.0–0.2)

## 2019-08-17 LAB — CMP (CANCER CENTER ONLY)
ALT: 17 U/L (ref 0–44)
AST: 17 U/L (ref 15–41)
Albumin: 4 g/dL (ref 3.5–5.0)
Alkaline Phosphatase: 88 U/L (ref 38–126)
Anion gap: 14 (ref 5–15)
BUN: 18 mg/dL (ref 8–23)
CO2: 21 mmol/L — ABNORMAL LOW (ref 22–32)
Calcium: 10.1 mg/dL (ref 8.9–10.3)
Chloride: 101 mmol/L (ref 98–111)
Creatinine: 0.85 mg/dL (ref 0.44–1.00)
GFR, Est AFR Am: >60 mL/min (ref >60)
GFR, Estimated: >60 mL/min (ref >60)
Glucose, Bld: 136 mg/dL — ABNORMAL HIGH (ref 70–99)
Potassium: 4.3 mmol/L (ref 3.5–5.1)
Sodium: 136 mmol/L (ref 135–145)
Total Bilirubin: 0.2 mg/dL — ABNORMAL LOW (ref 0.3–1.2)
Total Protein: 7.9 g/dL (ref 6.5–8.1)

## 2019-08-17 MED ORDER — ESTRADIOL 0.1 MG/GM VA CREA: 1.0000 | TOPICAL_CREAM | VAGINAL | 12 refills | Status: AC

## 2019-08-17 MED ORDER — SODIUM CHLORIDE 0.9 % IV SOLN
175.0000 mg/m2 | Freq: Once | INTRAVENOUS | Status: AC
  Administered 2019-08-17: 336 mg via INTRAVENOUS
  Filled 2019-08-17: qty 56

## 2019-08-17 MED ORDER — SODIUM CHLORIDE 0.9 % IV SOLN
553.2000 mg | Freq: Once | INTRAVENOUS | Status: AC
  Administered 2019-08-17: 550 mg via INTRAVENOUS
  Filled 2019-08-17: qty 55

## 2019-08-17 MED ORDER — FAMOTIDINE IN NACL 20-0.9 MG/50ML-% IV SOLN
20.0000 mg | Freq: Once | INTRAVENOUS | Status: AC
  Administered 2019-08-17: 20 mg via INTRAVENOUS

## 2019-08-17 MED ORDER — DEXAMETHASONE SODIUM PHOSPHATE 10 MG/ML IJ SOLN
10.0000 mg | Freq: Once | INTRAMUSCULAR | Status: AC
  Administered 2019-08-17: 10 mg via INTRAVENOUS

## 2019-08-17 MED ORDER — PALONOSETRON HCL INJECTION 0.25 MG/5ML
0.2500 mg | Freq: Once | INTRAVENOUS | Status: AC
  Administered 2019-08-17: 0.25 mg via INTRAVENOUS

## 2019-08-17 MED ORDER — SODIUM CHLORIDE 0.9 % IV SOLN
150.0000 mg | Freq: Once | INTRAVENOUS | Status: AC
  Administered 2019-08-17: 150 mg via INTRAVENOUS
  Filled 2019-08-17: qty 150

## 2019-08-17 MED ORDER — DIPHENHYDRAMINE HCL 50 MG/ML IJ SOLN
25.0000 mg | Freq: Once | INTRAMUSCULAR | Status: AC
  Administered 2019-08-17: 25 mg via INTRAVENOUS

## 2019-08-17 MED ORDER — SODIUM CHLORIDE 0.9% FLUSH
10.0000 mL | Freq: Once | INTRAVENOUS | Status: AC
  Administered 2019-08-17: 10 mL
  Filled 2019-08-17: qty 10

## 2019-08-17 MED ORDER — HEPARIN SOD (PORK) LOCK FLUSH 100 UNIT/ML IV SOLN
500.0000 [IU] | Freq: Once | INTRAVENOUS | Status: AC | PRN
  Administered 2019-08-17: 500 [IU]
  Filled 2019-08-17: qty 5

## 2019-08-17 MED ORDER — DEXAMETHASONE SODIUM PHOSPHATE 10 MG/ML IJ SOLN
INTRAMUSCULAR | Status: AC
  Filled 2019-08-17: qty 1

## 2019-08-17 MED ORDER — PALONOSETRON HCL INJECTION 0.25 MG/5ML
INTRAVENOUS | Status: AC
  Filled 2019-08-17: qty 5

## 2019-08-17 MED ORDER — SODIUM CHLORIDE 0.9% FLUSH
10.0000 mL | INTRAVENOUS | Status: DC | PRN
  Administered 2019-08-17: 10 mL
  Filled 2019-08-17: qty 10

## 2019-08-17 MED ORDER — DIPHENHYDRAMINE HCL 50 MG/ML IJ SOLN
INTRAMUSCULAR | Status: AC
  Filled 2019-08-17: qty 1

## 2019-08-17 MED ORDER — SODIUM CHLORIDE 0.9 % IV SOLN
Freq: Once | INTRAVENOUS | Status: AC
  Filled 2019-08-17: qty 250

## 2019-08-17 MED ORDER — FAMOTIDINE IN NACL 20-0.9 MG/50ML-% IV SOLN
INTRAVENOUS | Status: AC
  Filled 2019-08-17: qty 50

## 2019-08-17 MED FILL — ESTRADIOL 0.1 MG/GM CRM: 0.1 | 90 days supply | Qty: 43 | Fill #0

## 2019-08-17 NOTE — Assessment & Plan Note (Signed)
So far, she tolerated chemotherapy fairly well, except for some bone pain, nausea and changes in bowel habits We will proceed with treatment without delay Recent genetic testing is negative

## 2019-08-17 NOTE — Patient Instructions (Signed)
[facility] Discharge Instructions for Patients Receiving Chemotherapy  Today you received the following chemotherapy agents: paclitaxel and carboplatin.  To help prevent nausea and vomiting after your treatment, we encourage you to take your nausea medication as directed.   If you develop nausea and vomiting that is not controlled by your nausea medication, call the clinic.   BELOW ARE SYMPTOMS THAT SHOULD BE REPORTED IMMEDIATELY:  *FEVER GREATER THAN 100.5 F  *CHILLS WITH OR WITHOUT FEVER  NAUSEA AND VOMITING THAT IS NOT CONTROLLED WITH YOUR NAUSEA MEDICATION  *UNUSUAL SHORTNESS OF BREATH  *UNUSUAL BRUISING OR BLEEDING  TENDERNESS IN MOUTH AND THROAT WITH OR WITHOUT PRESENCE OF ULCERS  *URINARY PROBLEMS  *BOWEL PROBLEMS  UNUSUAL RASH Items with * indicate a potential emergency and should be followed up as soon as possible.  Feel free to call the clinic should you have any questions or concerns. The clinic phone number is [phone].  Please show the CHEMO ALERT CARD at check-in to the Emergency Department and triage nurse.   

## 2019-08-17 NOTE — Assessment & Plan Note (Signed)
Her neuropathy is stable She will continue gabapentin for now We will continue with previous dose

## 2019-08-17 NOTE — Telephone Encounter (Signed)
Scheduled appt per 4/2 sch message - pt to get an updated schedule in tx area

## 2019-08-17 NOTE — Assessment & Plan Note (Signed)
She has symptoms of frequent urination It could be due to atrophic cystitis/vaginitis I recommend trial of vaginal estrogen cream and she agreed

## 2019-08-17 NOTE — Assessment & Plan Note (Signed)
Her pain is well controlled with low-dose pain medicine as needed She will continue the same

## 2019-08-17 NOTE — Progress Notes (Signed)
[place] OFFICE PROGRESS NOTE  Patient Care Team: Larene Beach, MD as PCP - General (Family Medicine) Awanda Mink Craige Cotta, RN as Oncology Nurse Navigator (Oncology)  ASSESSMENT & PLAN:  Ovarian cancer on right [facility]) So far, she tolerated chemotherapy fairly well, except for some bone pain, nausea and changes in bowel habits We will proceed with treatment without delay Recent genetic testing is negative  Atrophic vaginitis She has symptoms of frequent urination It could be due to atrophic cystitis/vaginitis I recommend trial of vaginal estrogen cream and she agreed  Cancer associated pain Her pain is well controlled with low-dose pain medicine as needed She will continue the same  Peripheral neuropathy due to chemotherapy [facility]) Her neuropathy is stable She will continue gabapentin for now We will continue with previous dose    No orders of the defined types were placed in this encounter.   All questions were answered. The patient knows to call the clinic with any problems, questions or concerns. The total time spent in the appointment was 20 minutes encounter with patients including review of chart and various tests results, discussions about plan of care and coordination of care plan   [name], MD 08/17/2019 10:44 AM  INTERVAL HISTORY: Please see below for problem oriented charting. She returns for further follow-up She tolerated last cycle of treatment well except for some mild nausea and bone pain She has some mild changes in bowel habits with mild loose stool alternate with constipation She also have frequent urination but she did her own dipstick at home which show no evidence to suggest infection Denies worsening peripheral neuropathy  SUMMARY OF ONCOLOGIC HISTORY: Oncology History Overview Note  Neg genetics   Ovarian cancer on right ([place])  01/16/2019 Initial Diagnosis   She had vague symptom of dyspareunia. Presented to ER for evaluation in  November for abdominal pain and vague symptom of fullness and imaging showed abnormalities   04/16/2019 Imaging   1. Nonvisualization of the appendix. However, there is a large volume of ascites within the abdomen and pelvis which appears partially loculated. In the acute setting findings may be the sequelae of peritonitis. However, there are multiple partially calcified peritoneal nodules throughout the omentum which are concerning for peritoneal carcinomatosis. Noncalcified nodule in the left lower quadrant peritoneal reflection is noted. Further investigation with diagnostic paracentesis is advised. 2. Ill-defined low-density mass within the right iliac fossa is suspected and may represent an ovarian neoplasm. 3. Aortic atherosclerosis. 4. Small bilateral pleural effusions. 5. Hiatal hernia. 6. Prior granulomatous disease.   Aortic Atherosclerosis (ICD10-I70.0).   04/17/2019 Tumor Marker   Patient's tumor was tested for the following markers: CA-125 Results of the tumor marker test revealed 288.   04/18/2019 Procedure   Successful ultrasound-guided diagnostic and therapeutic paracentesis yielding 3.2 liters of peritoneal fluid.   04/18/2019 Pathology Results   FINAL MICROSCOPIC DIAGNOSIS:  - Malignant cells present  - See comment   SPECIMEN ADEQUACY:  Satisfactory for evaluation   DIAGNOSTIC COMMENTS:  The cell block has scattered malignant cells.  These atypical cells are positive for cytokeraitn 7, MOC-31, Pax8, and WT-1 but negative for TTF-1 and cytokeratin 20.  Overall, the features are consistent with a primary gynecologic neoplasm   04/20/2019 Cancer Staging   Staging form: Ovary, Fallopian Tube, and Primary Peritoneal Carcinoma, AJCC 8th Edition - Clinical stage from 04/20/2019: FIGO Stage IIIC (cT3c, cN0, cM0) - Signed by [name], MD on 04/20/2019   04/25/2019 Procedure   Successful ultrasound-guided therapeutic  paracentesis yielding 2.5 liters of peritoneal fluid.      04/27/2019 -  Chemotherapy   The patient had carboplatin and taxol x 3 cycles for neoadjuvant chemotherapy treatment, followed by interval debulking surgery and 3 more cycles of chemotherapy     05/17/2019 Procedure   Placement of a subcutaneous port device. Catheter tip at the SVC and right atrium junction.   06/11/2019 Tumor Marker   Patient's tumor was tested for the following markers: CA-125 Results of the tumor marker test revealed 20.8   06/18/2019 Imaging   CT chest, abdomen and pelvis 1. Positive response to therapy. Complex 6.6 cm cystic right adnexal mass is decreased in size. Omental nodularity has largely resolved. Ascites has resolved. No adenopathy. 2. No evidence of metastatic disease in the chest. 3. Chronic findings include: Aortic Atherosclerosis (ICD10-I70.0). Small hiatal hernia. Mild sigmoid diverticulosis.   07/03/2019 Surgery   Preoperative Diagnosis: ovarian cancer stage IIIC     Procedure(s) Performed: Robotic-assisted laparoscopic bilateral salpingo-oophorectomy, omentectomy, radical tumor debulking with mini-laparotomy. Optimal cytoreduction with no gross residual disease remaining.    Surgeon: Everitt Amber, M.D. Specimens: Bilateral ovaries, fallopian tubes, omentum    Indication for Procedure:  Patient has a history of stage IIIC ovarian cancer, s/p 3 cycles of neoadjuvant chemotherapy with good partial clinical response. Right ovarian mass on imaging and omental caking.   Operative Findings: 6-8cm right ovarian mass densely adherent to pelvic sidewall with filmy adhesions to colonic epiploica but not bowel wall. Normal appendix. Slightly enlarged left tube and ovary but without discrete mass. Omental nodularity throughout infracolic omentum. Tiny miliary <20m nodules on left diaphragm.    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 Other Tissue/ Organ Involvement: Left ovary and omentum Largest Extrapelvic Peritoneal Focus: 4.3 cm tumor deposit in the omentum Peritoneal/Ascitic Fluid: Positive for carcinoma (([id number] Treatment Effect: Moderate response identified (CRS 2) Regional Lymph Nodes: No lymph nodes submitted or found Pathologic Stage Classification (pTNM, AJCC 8th Edition): pT3c, pNx Representative Tumor Block: B10 Comment(s): Only the serous component shows metastasis to the omentum and spread to left ovary and right fallopian tube   07/27/2019 Tumor Marker   Patient's tumor was tested for the following markers: CA-125 Results of the tumor marker test revealed 13.7   08/14/2019 Genetic Testing   Negative germline + somatic testing. No pathogenic variants identified on the [employer] VUS in MRE11A identified in the germline.  The report date is 08/14/2019.   The CancerNext gene panel offered by [employer] sequencing and rearrangement analysis for the following 36 genes: APC*, ATM*, AXIN2, BARD1, BMPR1A, BRCA1*, BRCA2*, BRIP1*, CDH1*, CDK4, CDKN2A, CHEK2*, DICER1, MLH1*, MSH2*, MSH3, MSH6*, MUTYH*, NBN, NF1*, NTHL1, PALB2*, PMS2*, PTEN*, RAD51C*, RAD51D*, RECQL, SMAD4, SMARCA4, STK11 and TP53* (sequencing and deletion/duplication); HOXB13, POLD1 and POLE (sequencing only); EPCAM and GREM1 (deletion/duplication only).   Somatic genes analyzed through TumorNext-HRD: ATM, BARD1, BRCA1, BRCA2, BRIP1, CHEK2, MRE11A, NBN, PALB2, RAD51C, RAD51D.       REVIEW OF SYSTEMS:   Constitutional: Denies fevers, chills or abnormal weight loss Eyes: Denies blurriness of vision Ears, nose, mouth,  throat, and face: Denies mucositis or sore throat Respiratory: Denies cough, dyspnea or wheezes Cardiovascular: Denies palpitation, chest discomfort  or lower extremity swelling Skin: Denies abnormal skin rashes Lymphatics: Denies new lymphadenopathy or easy bruising Behavioral/Psych: Mood is stable, no new changes  All other systems were reviewed with the patient and are negative.  I have reviewed the past medical history, past surgical history, social history and family history with the patient and they are unchanged from previous note.  ALLERGIES:  is allergic to bee venom; iodinated diagnostic agents; amlodipine; demerol [meperidine hcl]; lisinopril; losartan; iodine; and other.  MEDICATIONS:  Current Outpatient Medications  Medication Sig Dispense Refill  . Cholecalciferol (VITAMIN D-3 PO) Take 4,000 Units by mouth daily.     Marland Kitchen dexamethasone (DECADRON) 4 MG tablet Take 1 tablet (4 mg total) by mouth 2 (two) times daily. 14 tablet 0  . esomeprazole (NEXIUM) 40 MG capsule Take 1 daily in the morning (Patient taking differently: Take 40 mg by mouth every morning. ) 30 capsule 3  . estradiol (ESTRACE VAGINAL) 0.1 MG/GM vaginal cream Place 1 Applicatorful vaginally 3 (three) times a week. 42.5 g 12  . FLUoxetine (PROZAC) 20 MG capsule Take 1 capsule (20 mg total) by mouth daily. 90 capsule 3  . gabapentin (NEURONTIN) 300 MG capsule Take 1 capsule (300 mg total) by mouth 3 (three) times daily. (Patient taking differently: Take 300 mg by mouth at bedtime. ) 90 capsule 3  . lansoprazole (PREVACID) 15 MG capsule Take 15 mg by mouth at bedtime.    . lidocaine-prilocaine (EMLA) cream Apply to affected area once (Patient taking differently: Apply 1 application topically daily as needed (port access). ) 30 g 3  . ondansetron (ZOFRAN-ODT) 8 MG disintegrating tablet Take 1 tablet (8 mg total) by mouth every 8 (eight) hours as needed for nausea or vomiting. 30 tablet 1  . oxyCODONE (OXY IR/ROXICODONE) 5  MG immediate release tablet Take 1 tablet (5 mg total) by mouth every 4 (four) hours as needed for severe pain. For AFTER surgery only, do not take and drive 15 tablet 0  . prochlorperazine (COMPAZINE) 10 MG tablet Take 1 tablet (10 mg total) by mouth every 6 (six) hours as needed (Nausea or vomiting). 30 tablet 1  . simvastatin (ZOCOR) 40 MG tablet Take 1 tablet (40 mg total) by mouth at bedtime. 90 tablet 4   No current facility-administered medications for this visit.    PHYSICAL EXAMINATION: ECOG PERFORMANCE STATUS: 1 - Symptomatic but completely ambulatory  Vitals:   08/17/19 0936  BP: (!) 131/55  Pulse: (!) 104  Resp: 18  Temp: 98.2 F (36.8 C)  SpO2: 100%   Filed Weights   08/17/19 0936  Weight: 178 lb 9.6 oz (81 kg)    GENERAL:alert, no distress and comfortable SKIN: skin color, texture, turgor are normal, no rashes or significant lesions EYES: normal, Conjunctiva are pink and non-injected, sclera clear OROPHARYNX:no exudate, no erythema and lips, buccal mucosa, and tongue normal  NECK: supple, thyroid normal size, non-tender, without nodularity LYMPH:  no palpable lymphadenopathy in the cervical, axillary or inguinal LUNGS: clear to auscultation and percussion with normal breathing effort HEART: regular rate & rhythm and no murmurs and no lower extremity edema ABDOMEN:abdomen soft, non-tender and normal bowel sounds Musculoskeletal:no cyanosis of digits and no clubbing  NEURO: alert & oriented x 3 with fluent speech, no focal motor/sensory deficits  LABORATORY DATA:  I have reviewed the data as listed    Component Value Date/Time   NA 136 08/17/2019 0930   NA 137 10/27/2011 0738   K 4.3 08/17/2019 0930  K 4.1 10/27/2011 0738   CL 101 08/17/2019 0930   CL 105 10/27/2011 0738   CO2 21 (L) 08/17/2019 0930   CO2 26 10/27/2011 0738   GLUCOSE 136 (H) 08/17/2019 0930   GLUCOSE 111 (H) 10/27/2011 0738   BUN 18 08/17/2019 0930   BUN 15 10/27/2011 0738   CREATININE  0.85 08/17/2019 0930   CREATININE 1.01 (H) 12/16/2015 1030   CALCIUM 10.1 08/17/2019 0930   CALCIUM 10.0 09/28/2013 0000   PROT 7.9 08/17/2019 0930   PROT 8.1 10/27/2011 0738   ALBUMIN 4.0 08/17/2019 0930   ALBUMIN 3.9 10/27/2011 0738   AST 17 08/17/2019 0930   ALT 17 08/17/2019 0930   ALT 29 10/27/2011 0738   ALKPHOS 88 08/17/2019 0930   ALKPHOS 102 10/27/2011 0738   BILITOT 0.2 (L) 08/17/2019 0930   GFRNONAA >60 08/17/2019 0930   GFRNONAA 59 (L) 12/16/2015 1030   GFRAA >60 08/17/2019 0930   GFRAA 68 12/16/2015 1030    No results found for: SPEP, UPEP  Lab Results  Component Value Date   WBC 8.5 08/17/2019   NEUTROABS 7.0 08/17/2019   HGB 11.5 (L) 08/17/2019   HCT 34.5 (L) 08/17/2019   MCV 94.5 08/17/2019   PLT 358 08/17/2019      Chemistry      Component Value Date/Time   NA 136 08/17/2019 0930   NA 137 10/27/2011 0738   K 4.3 08/17/2019 0930   K 4.1 10/27/2011 0738   CL 101 08/17/2019 0930   CL 105 10/27/2011 0738   CO2 21 (L) 08/17/2019 0930   CO2 26 10/27/2011 0738   BUN 18 08/17/2019 0930   BUN 15 10/27/2011 0738   CREATININE 0.85 08/17/2019 0930   CREATININE 1.01 (H) 12/16/2015 1030   GLU 98 09/28/2013 0000      Component Value Date/Time   CALCIUM 10.1 08/17/2019 0930   CALCIUM 10.0 09/28/2013 0000   ALKPHOS 88 08/17/2019 0930   ALKPHOS 102 10/27/2011 0738   AST 17 08/17/2019 0930   ALT 17 08/17/2019 0930   ALT 29 10/27/2011 0738   BILITOT 0.2 (L) 08/17/2019 0930

## 2019-08-23 ENCOUNTER — Other Ambulatory Visit: Payer: Self-pay | Admitting: Hematology and Oncology

## 2019-08-23 ENCOUNTER — Telehealth: Payer: Self-pay

## 2019-08-23 NOTE — Telephone Encounter (Signed)
She called and left a message. She is having allergy issues due to the pollen. Any over the counter medications for allergies that she should avoid? Any OTC that you would recommend?

## 2019-08-23 NOTE — Telephone Encounter (Signed)
Zyrtec, benadryl, claritin or allegra all ok I would avoid anything with added decongestants (for example, get plain claritin and not claritin with D)

## 2019-08-23 NOTE — Telephone Encounter (Signed)
Called and given below message. She verbalized understanding.  She wanted to let Dr. Alvy Bimler know that is a genius. The estradiol cream has made her feel so much better. She said thank you!

## 2019-09-04 NOTE — Progress Notes (Signed)
Pharmacist Chemotherapy Monitoring - Follow Up Assessment    I verify that I have reviewed each item in the below checklist:  . Regimen for the patient is scheduled for the appropriate day and plan matches scheduled date. Marland Kitchen Appropriate non-routine labs are ordered dependent on drug ordered. . If applicable, additional medications reviewed and ordered per protocol based on lifetime cumulative doses and/or treatment regimen.   Plan for follow-up and/or issues identified: No . I-vent associated with next due treatment: No . MD and/or nursing notified: No  Marvis Bakken D 09/04/2019 4:23 PM

## 2019-09-10 ENCOUNTER — Inpatient Hospital Stay: Payer: Medicare HMO

## 2019-09-10 ENCOUNTER — Telehealth: Payer: Self-pay | Admitting: Hematology and Oncology

## 2019-09-10 ENCOUNTER — Inpatient Hospital Stay (HOSPITAL_BASED_OUTPATIENT_CLINIC_OR_DEPARTMENT_OTHER): Payer: Medicare HMO | Admitting: Hematology and Oncology

## 2019-09-10 ENCOUNTER — Other Ambulatory Visit: Payer: Self-pay

## 2019-09-10 ENCOUNTER — Encounter: Payer: Self-pay | Admitting: Hematology and Oncology

## 2019-09-10 VITALS — BP 150/87 | HR 95 | Temp 98.3°F | Resp 18 | Ht 67.0 in | Wt 181.8 lb

## 2019-09-10 DIAGNOSIS — C786 Secondary malignant neoplasm of retroperitoneum and peritoneum: Secondary | ICD-10-CM | POA: Diagnosis not present

## 2019-09-10 DIAGNOSIS — G620 Drug-induced polyneuropathy: Secondary | ICD-10-CM | POA: Diagnosis not present

## 2019-09-10 DIAGNOSIS — Z5111 Encounter for antineoplastic chemotherapy: Secondary | ICD-10-CM | POA: Diagnosis not present

## 2019-09-10 DIAGNOSIS — C561 Malignant neoplasm of right ovary: Secondary | ICD-10-CM | POA: Diagnosis not present

## 2019-09-10 DIAGNOSIS — G893 Neoplasm related pain (acute) (chronic): Secondary | ICD-10-CM

## 2019-09-10 DIAGNOSIS — C801 Malignant (primary) neoplasm, unspecified: Secondary | ICD-10-CM

## 2019-09-10 DIAGNOSIS — T451X5A Adverse effect of antineoplastic and immunosuppressive drugs, initial encounter: Secondary | ICD-10-CM

## 2019-09-10 LAB — CBC WITH DIFFERENTIAL (CANCER CENTER ONLY)
Abs Immature Granulocytes: 0.05 10*3/uL (ref 0.00–0.07)
Basophils Absolute: 0 10*3/uL (ref 0.0–0.1)
Basophils Relative: 0 %
Eosinophils Absolute: 0 10*3/uL (ref 0.0–0.5)
Eosinophils Relative: 0 %
HCT: 33 % — ABNORMAL LOW (ref 36.0–46.0)
Hemoglobin: 11.1 g/dL — ABNORMAL LOW (ref 12.0–15.0)
Immature Granulocytes: 1 %
Lymphocytes Relative: 12 %
Lymphs Abs: 1.2 10*3/uL (ref 0.7–4.0)
MCH: 31.5 pg (ref 26.0–34.0)
MCHC: 33.6 g/dL (ref 30.0–36.0)
MCV: 93.8 fL (ref 80.0–100.0)
Monocytes Absolute: 0.2 10*3/uL (ref 0.1–1.0)
Monocytes Relative: 2 %
Neutro Abs: 8.4 10*3/uL — ABNORMAL HIGH (ref 1.7–7.7)
Neutrophils Relative %: 85 %
Platelet Count: 299 10*3/uL (ref 150–400)
RBC: 3.52 MIL/uL — ABNORMAL LOW (ref 3.87–5.11)
RDW: 14.2 % (ref 11.5–15.5)
WBC Count: 9.9 10*3/uL (ref 4.0–10.5)
nRBC: 0 % (ref 0.0–0.2)

## 2019-09-10 LAB — CMP (CANCER CENTER ONLY)
ALT: 21 U/L (ref 0–44)
AST: 19 U/L (ref 15–41)
Albumin: 3.8 g/dL (ref 3.5–5.0)
Alkaline Phosphatase: 95 U/L (ref 38–126)
Anion gap: 12 (ref 5–15)
BUN: 10 mg/dL (ref 8–23)
CO2: 23 mmol/L (ref 22–32)
Calcium: 9.8 mg/dL (ref 8.9–10.3)
Chloride: 103 mmol/L (ref 98–111)
Creatinine: 0.83 mg/dL (ref 0.44–1.00)
GFR, Est AFR Am: >60 mL/min (ref >60)
GFR, Estimated: >60 mL/min (ref >60)
Glucose, Bld: 126 mg/dL — ABNORMAL HIGH (ref 70–99)
Potassium: 4.2 mmol/L (ref 3.5–5.1)
Sodium: 138 mmol/L (ref 135–145)
Total Bilirubin: 0.3 mg/dL (ref 0.3–1.2)
Total Protein: 7.8 g/dL (ref 6.5–8.1)

## 2019-09-10 MED ORDER — SODIUM CHLORIDE 0.9 % IV SOLN
175.0000 mg/m2 | Freq: Once | INTRAVENOUS | Status: AC
  Administered 2019-09-10: 336 mg via INTRAVENOUS
  Filled 2019-09-10: qty 56

## 2019-09-10 MED ORDER — SODIUM CHLORIDE 0.9 % IV SOLN
Freq: Once | INTRAVENOUS | Status: AC
  Filled 2019-09-10: qty 250

## 2019-09-10 MED ORDER — PALONOSETRON HCL INJECTION 0.25 MG/5ML
INTRAVENOUS | Status: AC
  Filled 2019-09-10: qty 5

## 2019-09-10 MED ORDER — SODIUM CHLORIDE 0.9% FLUSH
10.0000 mL | Freq: Once | INTRAVENOUS | Status: AC
  Administered 2019-09-10: 10 mL
  Filled 2019-09-10: qty 10

## 2019-09-10 MED ORDER — SODIUM CHLORIDE 0.9 % IV SOLN
150.0000 mg | Freq: Once | INTRAVENOUS | Status: AC
  Administered 2019-09-10: 13:00:00 150 mg via INTRAVENOUS
  Filled 2019-09-10: qty 150

## 2019-09-10 MED ORDER — PALONOSETRON HCL INJECTION 0.25 MG/5ML
0.2500 mg | Freq: Once | INTRAVENOUS | Status: AC
  Administered 2019-09-10: 12:00:00 0.25 mg via INTRAVENOUS

## 2019-09-10 MED ORDER — OXYCODONE HCL 5 MG PO TABS: 5.0000 mg | ORAL_TABLET | Freq: Four times a day (QID) | ORAL | 0 refills | Status: DC | PRN

## 2019-09-10 MED ORDER — FAMOTIDINE IN NACL 20-0.9 MG/50ML-% IV SOLN
INTRAVENOUS | Status: AC
  Filled 2019-09-10: qty 50

## 2019-09-10 MED ORDER — SODIUM CHLORIDE 0.9% FLUSH
10.0000 mL | INTRAVENOUS | Status: DC | PRN
  Administered 2019-09-10: 18:00:00 10 mL
  Filled 2019-09-10: qty 10

## 2019-09-10 MED ORDER — DIPHENHYDRAMINE HCL 50 MG/ML IJ SOLN
INTRAMUSCULAR | Status: AC
  Filled 2019-09-10: qty 1

## 2019-09-10 MED ORDER — FAMOTIDINE IN NACL 20-0.9 MG/50ML-% IV SOLN
20.0000 mg | Freq: Once | INTRAVENOUS | Status: AC
  Administered 2019-09-10: 20 mg via INTRAVENOUS

## 2019-09-10 MED ORDER — SODIUM CHLORIDE 0.9 % IV SOLN
10.0000 mg | Freq: Once | INTRAVENOUS | Status: AC
  Administered 2019-09-10: 10 mg via INTRAVENOUS
  Filled 2019-09-10: qty 10

## 2019-09-10 MED ORDER — HEPARIN SOD (PORK) LOCK FLUSH 100 UNIT/ML IV SOLN
500.0000 [IU] | Freq: Once | INTRAVENOUS | Status: AC | PRN
  Administered 2019-09-10: 18:00:00 500 [IU]
  Filled 2019-09-10: qty 5

## 2019-09-10 MED ORDER — DIPHENHYDRAMINE HCL 50 MG/ML IJ SOLN
25.0000 mg | Freq: Once | INTRAMUSCULAR | Status: AC
  Administered 2019-09-10: 25 mg via INTRAVENOUS

## 2019-09-10 MED ORDER — SODIUM CHLORIDE 0.9 % IV SOLN
553.2000 mg | Freq: Once | INTRAVENOUS | Status: AC
  Administered 2019-09-10: 550 mg via INTRAVENOUS
  Filled 2019-09-10: qty 55

## 2019-09-10 MED FILL — GABAPENTIN 300 MG CAPSULE: 300 | 30 days supply | Qty: 90 | Fill #2

## 2019-09-10 MED FILL — oxyCODONE HCL 5 MG TABS: 5 | 7 days supply | Qty: 30 | Fill #0

## 2019-09-10 NOTE — Assessment & Plan Note (Signed)
So far, she tolerated chemotherapy fairly well, except for some bone pain, nausea and mild neuropathy We will proceed with treatment without delay Recent genetic testing is negative After completion of chemotherapy today, I plan to order CT imaging next month for further follow-up

## 2019-09-10 NOTE — Assessment & Plan Note (Signed)
Her pain is well controlled with low-dose pain medicine as needed She will continue the same

## 2019-09-10 NOTE — Telephone Encounter (Signed)
Scheduled appts per 4/26 sch msg. Pt confirmed appt date and time.  

## 2019-09-10 NOTE — Progress Notes (Signed)
[facility] OFFICE PROGRESS NOTE  Patient Care Team: Larene Beach, MD as PCP - General (Family Medicine) Awanda Mink Craige Cotta, RN as Oncology Nurse Navigator (Oncology)  ASSESSMENT & PLAN:  Ovarian cancer on right [facility]) So far, she tolerated chemotherapy fairly well, except for some bone pain, nausea and mild neuropathy We will proceed with treatment without delay Recent genetic testing is negative After completion of chemotherapy today, I plan to order CT imaging next month for further follow-up  Cancer associated pain Her pain is well controlled with low-dose pain medicine as needed She will continue the same  Peripheral neuropathy due to chemotherapy [facility]) Her neuropathy is stable She will continue gabapentin for now We will continue with previous dose    Orders Placed This Encounter  Procedures  . CT ABDOMEN PELVIS W CONTRAST    Standing Status:   Future    Standing Expiration Date:   09/09/2020    Order Specific Question:   If indicated for the ordered procedure, I authorize the administration of contrast media per Radiology protocol    Answer:   Yes    Order Specific Question:   Preferred imaging location?    Answer:   [facility]    Order Specific Question:   Radiology Contrast Protocol - do NOT remove file path    Answer:   [email or web address]    All questions were answered. The patient knows to call the clinic with any problems, questions or concerns. The total time spent in the appointment was 20 minutes encounter with patients including review of chart and various tests results, discussions about plan of care and coordination of care plan   Connie Lark, MD 09/10/2019 1:29 PM  INTERVAL HISTORY: Please see below for problem oriented charting. She is seen prior to cycle 6 of treatment Her neuropathy is stable She continues to have intermittent joint pain for about 7 days after each dose of treatment No significant  nausea or constipation She had numerous questions about future follow-up Overall, she tolerated treatment fairly well  SUMMARY OF ONCOLOGIC HISTORY: Oncology History Overview Note  Neg genetics   Ovarian cancer on right ([place])  01/16/2019 Initial Diagnosis   She had vague symptom of dyspareunia. Presented to ER for evaluation in November for abdominal pain and vague symptom of fullness and imaging showed abnormalities   04/16/2019 Imaging   1. Nonvisualization of the appendix. However, there is a large volume of ascites within the abdomen and pelvis which appears partially loculated. In the acute setting findings may be the sequelae of peritonitis. However, there are multiple partially calcified peritoneal nodules throughout the omentum which are concerning for peritoneal carcinomatosis. Noncalcified nodule in the left lower quadrant peritoneal reflection is noted. Further investigation with diagnostic paracentesis is advised. 2. Ill-defined low-density mass within the right iliac fossa is suspected and may represent an ovarian neoplasm. 3. Aortic atherosclerosis. 4. Small bilateral pleural effusions. 5. Hiatal hernia. 6. Prior granulomatous disease.   Aortic Atherosclerosis (ICD10-I70.0).   04/17/2019 Tumor Marker   Patient's tumor was tested for the following markers: CA-125 Results of the tumor marker test revealed 288.   04/18/2019 Procedure   Successful ultrasound-guided diagnostic and therapeutic paracentesis yielding 3.2 liters of peritoneal fluid.   04/18/2019 Pathology Results   FINAL MICROSCOPIC DIAGNOSIS:  - Malignant cells present  - See comment   SPECIMEN ADEQUACY:  Satisfactory for evaluation   DIAGNOSTIC COMMENTS:  The cell block has scattered malignant cells.  These atypical cells are positive for  cytokeraitn 7, MOC-31, Pax8, and WT-1 but negative for TTF-1 and cytokeratin 20.  Overall, the features are consistent with a primary gynecologic neoplasm   04/20/2019  Cancer Staging   Staging form: Ovary, Fallopian Tube, and Primary Peritoneal Carcinoma, AJCC 8th Edition - Clinical stage from 04/20/2019: FIGO Stage IIIC (cT3c, cN0, cM0) - Signed by Connie Lark, MD on 04/20/2019   04/25/2019 Procedure   Successful ultrasound-guided therapeutic paracentesis yielding 2.5 liters of peritoneal fluid.     04/27/2019 -  Chemotherapy   The patient had carboplatin and taxol x 3 cycles for neoadjuvant chemotherapy treatment, followed by interval debulking surgery and 3 more cycles of chemotherapy     05/17/2019 Procedure   Placement of a subcutaneous port device. Catheter tip at the SVC and right atrium junction.   06/11/2019 Tumor Marker   Patient's tumor was tested for the following markers: CA-125 Results of the tumor marker test revealed 20.8   06/18/2019 Imaging   CT chest, abdomen and pelvis 1. Positive response to therapy. Complex 6.6 cm cystic right adnexal mass is decreased in size. Omental nodularity has largely resolved. Ascites has resolved. No adenopathy. 2. No evidence of metastatic disease in the chest. 3. Chronic findings include: Aortic Atherosclerosis (ICD10-I70.0). Small hiatal hernia. Mild sigmoid diverticulosis.   07/03/2019 Surgery   Preoperative Diagnosis: ovarian cancer stage IIIC     Procedure(s) Performed: Robotic-assisted laparoscopic bilateral salpingo-oophorectomy, omentectomy, radical tumor debulking with mini-laparotomy. Optimal cytoreduction with no gross residual disease remaining.    Surgeon: Everitt Amber, M.D. Specimens: Bilateral ovaries, fallopian tubes, omentum    Indication for Procedure:  Patient has a history of stage IIIC ovarian cancer, s/p 3 cycles of neoadjuvant chemotherapy with good partial clinical response. Right ovarian mass on imaging and omental caking.   Operative Findings: 6-8cm right ovarian mass densely adherent to pelvic sidewall with filmy adhesions to colonic epiploica but not bowel wall. Normal appendix.  Slightly enlarged left tube and ovary but without discrete mass. Omental nodularity throughout infracolic omentum. Tiny miliary <45m nodules on left diaphragm.    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 Other Tissue/ Organ Involvement: Left ovary and omentum Largest Extrapelvic Peritoneal Focus: 4.3 cm tumor deposit in the omentum Peritoneal/Ascitic Fluid: Positive for carcinoma (([id number] Treatment Effect: Moderate response identified (CRS 2) Regional Lymph Nodes: No lymph nodes submitted or found Pathologic Stage Classification (pTNM, AJCC 8th Edition): pT3c, pNx Representative Tumor Block: B10 Comment(s): Only the serous component shows metastasis to the omentum and spread to left ovary and right fallopian tube   07/27/2019 Tumor Marker   Patient's tumor was tested for the following markers: CA-125 Results of the tumor marker test revealed 13.7   08/14/2019 Genetic Testing   Negative germline + somatic testing. No pathogenic variants identified on the [employer] VUS in MRE11A identified in the germline.  The report date is 08/14/2019.   The CancerNext gene panel offered by [employer] sequencing and rearrangement analysis for the following 36 genes: APC*, ATM*, AXIN2, BARD1, BMPR1A, BRCA1*, BRCA2*, BRIP1*, CDH1*, CDK4, CDKN2A, CHEK2*, DICER1, MLH1*, MSH2*, MSH3, MSH6*, MUTYH*, NBN, NF1*, NTHL1, PALB2*, PMS2*, PTEN*, RAD51C*, RAD51D*, RECQL, SMAD4, SMARCA4, STK11 and TP53* (sequencing and deletion/duplication); HOXB13, POLD1 and POLE (sequencing  only); EPCAM and GREM1 (deletion/duplication only).   Somatic genes analyzed through TumorNext-HRD: ATM, BARD1, BRCA1, BRCA2, BRIP1, CHEK2, MRE11A, NBN, PALB2, RAD51C, RAD51D.       REVIEW OF SYSTEMS:   Constitutional: Denies fevers, chills or abnormal weight loss Eyes: Denies blurriness of vision Ears, nose, mouth, throat, and face: Denies mucositis or sore throat Respiratory: Denies cough, dyspnea or wheezes Cardiovascular: Denies palpitation, chest discomfort or lower extremity swelling Gastrointestinal:  Denies nausea, heartburn or change in bowel habits Skin: Denies abnormal skin rashes Lymphatics: Denies new lymphadenopathy or easy bruising Behavioral/Psych: Mood is stable, no new changes  All other systems were reviewed with the patient and are negative.  I have reviewed the past medical history, past surgical history, social history and family history with the patient and they are unchanged from previous note.  ALLERGIES:  is allergic to bee venom; amlodipine; demerol [meperidine hcl]; lisinopril; losartan; and other.  MEDICATIONS:  Current Outpatient Medications  Medication Sig Dispense Refill  . loratadine (CLARITIN) 10 MG tablet Take 10 mg by mouth daily as needed for allergies.    . Cholecalciferol (VITAMIN D-3 PO) Take 4,000 Units by mouth daily.     Marland Kitchen dexamethasone (DECADRON) 4 MG tablet Take 1 tablet (4 mg total) by mouth 2 (two) times daily. 14 tablet 0  . esomeprazole (NEXIUM) 40 MG capsule Take 1 capsule by mouth once daily in the morning 30 capsule 6  . estradiol (ESTRACE VAGINAL) 0.1 MG/GM vaginal cream Place 1 Applicatorful vaginally 3 (three) times a week. 42.5 g 12  . FLUoxetine (PROZAC) 20 MG capsule Take 1 capsule (20 mg total) by mouth daily. 90 capsule 3  . gabapentin (NEURONTIN) 300 MG capsule Take 1 capsule (300 mg total) by mouth 3 (three) times daily. (Patient taking differently: Take 300 mg by mouth at bedtime. ) 90 capsule 3  . lansoprazole  (PREVACID) 15 MG capsule Take 15 mg by mouth at bedtime.    . lidocaine-prilocaine (EMLA) cream Apply to affected area once (Patient taking differently: Apply 1 application topically daily as needed (port access). ) 30 g 3  . ondansetron (ZOFRAN-ODT) 8 MG disintegrating tablet Take 1 tablet (8 mg total) by mouth every 8 (eight) hours as needed for nausea or vomiting. 30 tablet 1  . oxyCODONE (OXY IR/ROXICODONE) 5 MG immediate release tablet Take 1 tablet (5 mg total) by mouth every 6 (six) hours as needed for severe pain. 30 tablet 0  . prochlorperazine (COMPAZINE) 10 MG tablet Take 1 tablet (10 mg total) by mouth every 6 (six) hours as needed (Nausea or vomiting). 30 tablet 1  . simvastatin (ZOCOR) 40 MG tablet Take 1 tablet (40 mg total) by mouth at bedtime. 90 tablet 4   No current facility-administered medications for this visit.   Facility-Administered Medications Ordered in Other Visits  Medication Dose Route Frequency Provider Last Rate Last Admin  . CARBOplatin (PARAPLATIN) 550 mg in sodium chloride 0.9 % 250 mL chemo infusion  550 mg Intravenous Once Alvy Bimler, Ermagene Saidi, MD      . heparin lock flush 100 unit/mL  500 Units Intracatheter Once PRN Alvy Bimler, Javonte Elenes, MD      . PACLitaxel (TAXOL) 336 mg in sodium chloride 0.9 % 500 mL chemo infusion (> 29m/m2)  175 mg/m2 (Treatment Plan Recorded) Intravenous Once Elvis Boot, MD      . sodium chloride flush (NS) 0.9 % injection 10 mL  10 mL Intracatheter PRN GHeath Lark MD        PHYSICAL EXAMINATION:  ECOG PERFORMANCE STATUS: 1 - Symptomatic but completely ambulatory  Vitals:   09/10/19 1143  BP: (!) 150/87  Pulse: 95  Resp: 18  Temp: 98.3 F (36.8 C)  SpO2: 100%   Filed Weights   09/10/19 1143  Weight: 181 lb 12.8 oz (82.5 kg)    GENERAL:alert, no distress and comfortable NEURO: alert & oriented x 3 with fluent speech, no focal motor/sensory deficits  LABORATORY DATA:  I have reviewed the data as listed    Component Value Date/Time    NA 138 09/10/2019 1115   NA 137 10/27/2011 0738   K 4.2 09/10/2019 1115   K 4.1 10/27/2011 0738   CL 103 09/10/2019 1115   CL 105 10/27/2011 0738   CO2 23 09/10/2019 1115   CO2 26 10/27/2011 0738   GLUCOSE 126 (H) 09/10/2019 1115   GLUCOSE 111 (H) 10/27/2011 0738   BUN 10 09/10/2019 1115   BUN 15 10/27/2011 0738   CREATININE 0.83 09/10/2019 1115   CREATININE 1.01 (H) 12/16/2015 1030   CALCIUM 9.8 09/10/2019 1115   CALCIUM 10.0 09/28/2013 0000   PROT 7.8 09/10/2019 1115   PROT 8.1 10/27/2011 0738   ALBUMIN 3.8 09/10/2019 1115   ALBUMIN 3.9 10/27/2011 0738   AST 19 09/10/2019 1115   ALT 21 09/10/2019 1115   ALT 29 10/27/2011 0738   ALKPHOS 95 09/10/2019 1115   ALKPHOS 102 10/27/2011 0738   BILITOT 0.3 09/10/2019 1115   GFRNONAA >60 09/10/2019 1115   GFRNONAA 59 (L) 12/16/2015 1030   GFRAA >60 09/10/2019 1115   GFRAA 68 12/16/2015 1030    No results found for: SPEP, UPEP  Lab Results  Component Value Date   WBC 9.9 09/10/2019   NEUTROABS 8.4 (H) 09/10/2019   HGB 11.1 (L) 09/10/2019   HCT 33.0 (L) 09/10/2019   MCV 93.8 09/10/2019   PLT 299 09/10/2019      Chemistry      Component Value Date/Time   NA 138 09/10/2019 1115   NA 137 10/27/2011 0738   K 4.2 09/10/2019 1115   K 4.1 10/27/2011 0738   CL 103 09/10/2019 1115   CL 105 10/27/2011 0738   CO2 23 09/10/2019 1115   CO2 26 10/27/2011 0738   BUN 10 09/10/2019 1115   BUN 15 10/27/2011 0738   CREATININE 0.83 09/10/2019 1115   CREATININE 1.01 (H) 12/16/2015 1030   GLU 98 09/28/2013 0000      Component Value Date/Time   CALCIUM 9.8 09/10/2019 1115   CALCIUM 10.0 09/28/2013 0000   ALKPHOS 95 09/10/2019 1115   ALKPHOS 102 10/27/2011 0738   AST 19 09/10/2019 1115   ALT 21 09/10/2019 1115   ALT 29 10/27/2011 0738   BILITOT 0.3 09/10/2019 1115

## 2019-09-10 NOTE — Patient Instructions (Signed)
[facility] Discharge Instructions for Patients Receiving Chemotherapy  Today you received the following chemotherapy agents: Taxol/Carbo  To help prevent nausea and vomiting after your treatment, we encourage you to take your nausea medication as directed.   If you develop nausea and vomiting that is not controlled by your nausea medication, call the clinic.   BELOW ARE SYMPTOMS THAT SHOULD BE REPORTED IMMEDIATELY:  *FEVER GREATER THAN 100.5 F  *CHILLS WITH OR WITHOUT FEVER  NAUSEA AND VOMITING THAT IS NOT CONTROLLED WITH YOUR NAUSEA MEDICATION  *UNUSUAL SHORTNESS OF BREATH  *UNUSUAL BRUISING OR BLEEDING  TENDERNESS IN MOUTH AND THROAT WITH OR WITHOUT PRESENCE OF ULCERS  *URINARY PROBLEMS  *BOWEL PROBLEMS  UNUSUAL RASH Items with * indicate a potential emergency and should be followed up as soon as possible.  Feel free to call the clinic should you have any questions or concerns. The clinic phone number is [phone].  Please show the CHEMO ALERT CARD at check-in to the Emergency Department and triage nurse.   

## 2019-09-10 NOTE — Assessment & Plan Note (Signed)
Her neuropathy is stable She will continue gabapentin for now We will continue with previous dose

## 2019-09-11 ENCOUNTER — Telehealth: Payer: Self-pay

## 2019-09-11 LAB — CA 125: Cancer Antigen (CA) 125: 9.5 U/mL (ref 0.0–38.1)

## 2019-09-11 NOTE — Telephone Encounter (Signed)
-----   Message from [name], MD sent at 09/11/2019  7:49 AM EDT ----- Regarding: let her know CA-125 is good

## 2019-09-11 NOTE — Telephone Encounter (Signed)
Called and given below message. She verbalized understanding.  She forgot mention yesterday. The estrace cream is helping so much. When she has a full bladder, sometimes she has urinary leakage. Just FYI.

## 2019-10-10 ENCOUNTER — Inpatient Hospital Stay: Payer: Medicare HMO | Attending: Gynecologic Oncology

## 2019-10-10 ENCOUNTER — Inpatient Hospital Stay: Payer: Medicare HMO

## 2019-10-10 ENCOUNTER — Ambulatory Visit (HOSPITAL_COMMUNITY)
Admission: RE | Admit: 2019-10-10 | Discharge: 2019-10-10 | Disposition: A | Discharge status: Home / Self Care | Payer: Medicare HMO | Source: Ambulatory Visit | Attending: Hematology and Oncology | Admitting: Hematology and Oncology

## 2019-10-10 ENCOUNTER — Other Ambulatory Visit: Payer: Self-pay

## 2019-10-10 DIAGNOSIS — Z9221 Personal history of antineoplastic chemotherapy: Secondary | ICD-10-CM | POA: Insufficient documentation

## 2019-10-10 DIAGNOSIS — D6481 Anemia due to antineoplastic chemotherapy: Secondary | ICD-10-CM | POA: Diagnosis not present

## 2019-10-10 DIAGNOSIS — G620 Drug-induced polyneuropathy: Secondary | ICD-10-CM | POA: Diagnosis not present

## 2019-10-10 DIAGNOSIS — C786 Secondary malignant neoplasm of retroperitoneum and peritoneum: Secondary | ICD-10-CM | POA: Diagnosis present

## 2019-10-10 DIAGNOSIS — Z79899 Other long term (current) drug therapy: Secondary | ICD-10-CM | POA: Insufficient documentation

## 2019-10-10 DIAGNOSIS — C561 Malignant neoplasm of right ovary: Secondary | ICD-10-CM | POA: Insufficient documentation

## 2019-10-10 DIAGNOSIS — Z885 Allergy status to narcotic agent status: Secondary | ICD-10-CM | POA: Insufficient documentation

## 2019-10-10 DIAGNOSIS — K5730 Diverticulosis of large intestine without perforation or abscess without bleeding: Secondary | ICD-10-CM | POA: Insufficient documentation

## 2019-10-10 DIAGNOSIS — K449 Diaphragmatic hernia without obstruction or gangrene: Secondary | ICD-10-CM | POA: Insufficient documentation

## 2019-10-10 DIAGNOSIS — C801 Malignant (primary) neoplasm, unspecified: Secondary | ICD-10-CM | POA: Insufficient documentation

## 2019-10-10 DIAGNOSIS — R188 Other ascites: Secondary | ICD-10-CM | POA: Insufficient documentation

## 2019-10-10 DIAGNOSIS — Z888 Allergy status to other drugs, medicaments and biological substances status: Secondary | ICD-10-CM | POA: Insufficient documentation

## 2019-10-10 DIAGNOSIS — I700 Atherosclerosis of aorta: Secondary | ICD-10-CM | POA: Diagnosis not present

## 2019-10-10 DIAGNOSIS — T451X5A Adverse effect of antineoplastic and immunosuppressive drugs, initial encounter: Secondary | ICD-10-CM | POA: Diagnosis not present

## 2019-10-10 LAB — CBC WITH DIFFERENTIAL (CANCER CENTER ONLY)
Abs Immature Granulocytes: 0.02 10*3/uL (ref 0.00–0.07)
Basophils Absolute: 0.1 10*3/uL (ref 0.0–0.1)
Basophils Relative: 1 %
Eosinophils Absolute: 0.3 10*3/uL (ref 0.0–0.5)
Eosinophils Relative: 4 %
HCT: 33 % — ABNORMAL LOW (ref 36.0–46.0)
Hemoglobin: 10.9 g/dL — ABNORMAL LOW (ref 12.0–15.0)
Immature Granulocytes: 0 %
Lymphocytes Relative: 33 %
Lymphs Abs: 2.1 10*3/uL (ref 0.7–4.0)
MCH: 32.1 pg (ref 26.0–34.0)
MCHC: 33 g/dL (ref 30.0–36.0)
MCV: 97.1 fL (ref 80.0–100.0)
Monocytes Absolute: 0.8 10*3/uL (ref 0.1–1.0)
Monocytes Relative: 13 %
Neutro Abs: 3 10*3/uL (ref 1.7–7.7)
Neutrophils Relative %: 49 %
Platelet Count: 283 10*3/uL (ref 150–400)
RBC: 3.4 MIL/uL — ABNORMAL LOW (ref 3.87–5.11)
RDW: 15.2 % (ref 11.5–15.5)
WBC Count: 6.3 10*3/uL (ref 4.0–10.5)
nRBC: 0 % (ref 0.0–0.2)

## 2019-10-10 LAB — CMP (CANCER CENTER ONLY)
ALT: 20 U/L (ref 0–44)
AST: 19 U/L (ref 15–41)
Albumin: 3.9 g/dL (ref 3.5–5.0)
Alkaline Phosphatase: 76 U/L (ref 38–126)
Anion gap: 10 (ref 5–15)
BUN: 15 mg/dL (ref 8–23)
CO2: 25 mmol/L (ref 22–32)
Calcium: 9.9 mg/dL (ref 8.9–10.3)
Chloride: 102 mmol/L (ref 98–111)
Creatinine: 0.95 mg/dL (ref 0.44–1.00)
GFR, Est AFR Am: >60 mL/min (ref >60)
GFR, Estimated: >60 mL/min (ref >60)
Glucose, Bld: 96 mg/dL (ref 70–99)
Potassium: 4.2 mmol/L (ref 3.5–5.1)
Sodium: 137 mmol/L (ref 135–145)
Total Bilirubin: 0.4 mg/dL (ref 0.3–1.2)
Total Protein: 7.6 g/dL (ref 6.5–8.1)

## 2019-10-10 MED ORDER — HEPARIN SOD (PORK) LOCK FLUSH 100 UNIT/ML IV SOLN
500.0000 [IU] | Freq: Once | INTRAVENOUS | Status: AC
  Administered 2019-10-10: 500 [IU] via INTRAVENOUS

## 2019-10-10 MED ORDER — HEPARIN SOD (PORK) LOCK FLUSH 100 UNIT/ML IV SOLN
INTRAVENOUS | Status: AC
  Filled 2019-10-10: qty 5

## 2019-10-10 MED ORDER — SODIUM CHLORIDE (PF) 0.9 % IJ SOLN
INTRAMUSCULAR | Status: AC
  Filled 2019-10-10: qty 50

## 2019-10-10 MED ORDER — SODIUM CHLORIDE 0.9% FLUSH
10.0000 mL | Freq: Once | INTRAVENOUS | Status: AC
  Administered 2019-10-10: 10 mL
  Filled 2019-10-10: qty 10

## 2019-10-10 MED ORDER — IOHEXOL 300 MG/ML  SOLN
100.0000 mL | Freq: Once | INTRAMUSCULAR | Status: AC | PRN
  Administered 2019-10-10: 100 mL via INTRAVENOUS

## 2019-10-11 ENCOUNTER — Other Ambulatory Visit: Payer: Self-pay

## 2019-10-11 ENCOUNTER — Inpatient Hospital Stay (HOSPITAL_BASED_OUTPATIENT_CLINIC_OR_DEPARTMENT_OTHER): Payer: Medicare HMO | Admitting: Hematology and Oncology

## 2019-10-11 ENCOUNTER — Encounter: Payer: Self-pay | Admitting: Hematology and Oncology

## 2019-10-11 DIAGNOSIS — D6481 Anemia due to antineoplastic chemotherapy: Secondary | ICD-10-CM | POA: Insufficient documentation

## 2019-10-11 DIAGNOSIS — G620 Drug-induced polyneuropathy: Secondary | ICD-10-CM

## 2019-10-11 DIAGNOSIS — T451X5A Adverse effect of antineoplastic and immunosuppressive drugs, initial encounter: Secondary | ICD-10-CM | POA: Diagnosis not present

## 2019-10-11 DIAGNOSIS — C561 Malignant neoplasm of right ovary: Secondary | ICD-10-CM

## 2019-10-11 LAB — CA 125: Cancer Antigen (CA) 125: 8.9 U/mL (ref 0.0–38.1)

## 2019-10-11 MED FILL — ESTRADIOL 0.1 MG/GM CRM: 0.1 | 90 days supply | Qty: 43 | Fill #1

## 2019-10-11 NOTE — Progress Notes (Signed)
[place] OFFICE PROGRESS NOTE  Patient Care Team: Patient, No Pcp Per as PCP - General (General Practice) Connie Heath, Craige Cotta, RN as Oncology Nurse Navigator (Oncology)  ASSESSMENT & PLAN:  Ovarian cancer on right [facility]) I reviewed blood work and imaging studies with the patient and her husband She has achieved complete response to therapy I educated the patient the signs and symptoms to watch out for cancer recurrence I recommend port maintenance for 2 years We will continue close monitoring of tumor marker every 3 months She will see GYN surgeon in 3 months and I will see her back in 6 months  Peripheral neuropathy due to chemotherapy [facility]) She has residual neuropathy from treatment It is not severe I recommend her to continue taking gabapentin Once her neuropathy is less severe, she can slowly wean herself off gabapentin  Anemia due to antineoplastic chemotherapy She is not symptomatic Observe only for now I anticipate her anemia will improve in time   No orders of the defined types were placed in this encounter.   All questions were answered. The patient knows to call the clinic with any problems, questions or concerns. The total time spent in the appointment was 20 minutes encounter with patients including review of chart and various tests results, discussions about plan of care and coordination of care plan   [name], MD 10/11/2019 4:11 PM  INTERVAL HISTORY: Please see below for problem oriented charting. She returns with her husband for further follow-up She had multiple side effects from last cycle of treatment but they are improving She still have residual significant neuropathy in her hands and feet She is attempting to eat healthy and managed to lose some weight She is active Denies nausea or changes in bowel habits  SUMMARY OF ONCOLOGIC HISTORY: Oncology History Overview Note  Neg genetics   Ovarian cancer on right ([place])  01/16/2019 Initial  Diagnosis   She had vague symptom of dyspareunia. Presented to ER for evaluation in November for abdominal pain and vague symptom of fullness and imaging showed abnormalities   04/16/2019 Imaging   1. Nonvisualization of the appendix. However, there is a large volume of ascites within the abdomen and pelvis which appears partially loculated. In the acute setting findings may be the sequelae of peritonitis. However, there are multiple partially calcified peritoneal nodules throughout the omentum which are concerning for peritoneal carcinomatosis. Noncalcified nodule in the left lower quadrant peritoneal reflection is noted. Further investigation with diagnostic paracentesis is advised. 2. Ill-defined low-density mass within the right iliac fossa is suspected and may represent an ovarian neoplasm. 3. Aortic atherosclerosis. 4. Small bilateral pleural effusions. 5. Hiatal hernia. 6. Prior granulomatous disease.   Aortic Atherosclerosis (ICD10-I70.0).   04/17/2019 Tumor Marker   Patient's tumor was tested for the following markers: CA-125 Results of the tumor marker test revealed 288.   04/18/2019 Procedure   Successful ultrasound-guided diagnostic and therapeutic paracentesis yielding 3.2 liters of peritoneal fluid.   04/18/2019 Pathology Results   FINAL MICROSCOPIC DIAGNOSIS:  - Malignant cells present  - See comment   SPECIMEN ADEQUACY:  Satisfactory for evaluation   DIAGNOSTIC COMMENTS:  The cell block has scattered malignant cells.  These atypical cells are positive for cytokeraitn 7, MOC-31, Pax8, and WT-1 but negative for TTF-1 and cytokeratin 20.  Overall, the features are consistent with a primary gynecologic neoplasm   04/20/2019 Cancer Staging   Staging form: Ovary, Fallopian Tube, and Primary Peritoneal Carcinoma, AJCC 8th Edition - Clinical stage  from 04/20/2019: FIGO Stage IIIC (cT3c, cN0, cM0) - Signed by [name], MD on 04/20/2019   04/25/2019 Procedure   Successful  ultrasound-guided therapeutic paracentesis yielding 2.5 liters of peritoneal fluid.     04/27/2019 - 09/10/2019 Chemotherapy   The patient had carboplatin and taxol x 3 cycles for neoadjuvant chemotherapy treatment, followed by interval debulking surgery and 3 more cycles of chemotherapy     05/17/2019 Procedure   Placement of a subcutaneous port device. Catheter tip at the SVC and right atrium junction.   06/11/2019 Tumor Marker   Patient's tumor was tested for the following markers: CA-125 Results of the tumor marker test revealed 20.8   06/18/2019 Imaging   CT chest, abdomen and pelvis 1. Positive response to therapy. Complex 6.6 cm cystic right adnexal mass is decreased in size. Omental nodularity has largely resolved. Ascites has resolved. No adenopathy. 2. No evidence of metastatic disease in the chest. 3. Chronic findings include: Aortic Atherosclerosis (ICD10-I70.0). Small hiatal hernia. Mild sigmoid diverticulosis.   07/03/2019 Surgery   Preoperative Diagnosis: ovarian cancer stage IIIC     Procedure(s) Performed: Robotic-assisted laparoscopic bilateral salpingo-oophorectomy, omentectomy, radical tumor debulking with mini-laparotomy. Optimal cytoreduction with no gross residual disease remaining.    Surgeon: Everitt Amber, M.D. Specimens: Bilateral ovaries, fallopian tubes, omentum    Indication for Procedure:  Patient has a history of stage IIIC ovarian cancer, s/p 3 cycles of neoadjuvant chemotherapy with good partial clinical response. Right ovarian mass on imaging and omental caking.   Operative Findings: 6-8cm right ovarian mass densely adherent to pelvic sidewall with filmy adhesions to colonic epiploica but not bowel wall. Normal appendix. Slightly enlarged left tube and ovary but without discrete mass. Omental nodularity throughout infracolic omentum. Tiny miliary <92m nodules on left diaphragm.    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) Histologic Grade: High-grade Other Tissue/ Organ Involvement: Left ovary and omentum Largest Extrapelvic Peritoneal Focus: 4.3 cm tumor deposit in the omentum Peritoneal/Ascitic Fluid: Positive for carcinoma (([id number] Treatment Effect: Moderate response identified (CRS 2) Regional Lymph Nodes: No lymph nodes submitted or found Pathologic Stage Classification (pTNM, AJCC 8th Edition): pT3c, pNx Representative Tumor Block: B10 Comment(s): Only the serous component shows metastasis to the omentum and spread to left ovary and right fallopian tube   07/27/2019 Tumor Marker   Patient's tumor was tested for the following markers: CA-125 Results of the tumor marker test revealed 13.7   08/14/2019 Genetic Testing   Negative germline + somatic testing. No pathogenic variants identified on the [employer] VUS in MRE11A identified in the germline.  The report date is 08/14/2019.   The CancerNext gene panel offered by [employer] sequencing and rearrangement analysis for the following 36 genes: APC*, ATM*, AXIN2, BARD1, BMPR1A, BRCA1*, BRCA2*, BRIP1*, CDH1*, CDK4, CDKN2A, CHEK2*, DICER1, MLH1*, MSH2*, MSH3, MSH6*, MUTYH*, NBN, NF1*, NTHL1, PALB2*, PMS2*, PTEN*, RAD51C*, RAD51D*, RECQL, SMAD4, SMARCA4, STK11 and TP53* (sequencing and deletion/duplication); HOXB13, POLD1 and POLE (sequencing only); EPCAM and GREM1 (deletion/duplication only).   Somatic genes analyzed through TumorNext-HRD: ATM, BARD1, BRCA1, BRCA2, BRIP1, CHEK2, MRE11A, NBN, PALB2, RAD51C, RAD51D.     09/10/2019 Tumor Marker   Patient's tumor was  tested for the following markers: CA-125 Results of the  tumor marker test revealed 9.5   10/10/2019 Imaging   1. No findings suspicious for recurrent metastatic disease in the abdomen or pelvis. No adenopathy. Minimal smooth peritoneal thickening in the paracolic gutters and deep pelvis, nonspecific, with no discrete peritoneal implants. No ascites. Continued CT surveillance warranted. 2. Chronic findings include: Aortic Atherosclerosis (ICD10-I70.0). Small hiatal hernia. Minimal sigmoid diverticulosis.     10/10/2019 Tumor Marker   Patient's tumor was tested for the following markers: CA-125 Results of the tumor marker test revealed 8.9     REVIEW OF SYSTEMS:   Constitutional: Denies fevers, chills or abnormal weight loss Eyes: Denies blurriness of vision Ears, nose, mouth, throat, and face: Denies mucositis or sore throat Respiratory: Denies cough, dyspnea or wheezes Cardiovascular: Denies palpitation, chest discomfort or lower extremity swelling Gastrointestinal:  Denies nausea, heartburn or change in bowel habits Skin: Denies abnormal skin rashes Lymphatics: Denies new lymphadenopathy or easy bruising Behavioral/Psych: Mood is stable, no new changes  All other systems were reviewed with the patient and are negative.  I have reviewed the past medical history, past surgical history, social history and family history with the patient and they are unchanged from previous note.  ALLERGIES:  is allergic to bee venom; amlodipine; demerol [meperidine hcl]; lisinopril; losartan; and other.  MEDICATIONS:  Current Outpatient Medications  Medication Sig Dispense Refill  . Cholecalciferol (VITAMIN D-3 PO) Take 4,000 Units by mouth daily.     Marland Kitchen estradiol (ESTRACE VAGINAL) 0.1 MG/GM vaginal cream Place 1 Applicatorful vaginally 3 (three) times a week. 42.5 g 12  . FLUoxetine (PROZAC) 20 MG capsule Take 1 capsule (20 mg total) by mouth daily. 90 capsule 3  . gabapentin (NEURONTIN) 300 MG  capsule Take 1 capsule (300 mg total) by mouth 3 (three) times daily. (Patient taking differently: Take 300 mg by mouth at bedtime. ) 90 capsule 3  . lansoprazole (PREVACID) 15 MG capsule Take 15 mg by mouth at bedtime.    . lidocaine-prilocaine (EMLA) cream Apply to affected area once (Patient taking differently: Apply 1 application topically daily as needed (port access). ) 30 g 3  . loratadine (CLARITIN) 10 MG tablet Take 10 mg by mouth daily as needed for allergies.    . simvastatin (ZOCOR) 40 MG tablet Take 1 tablet (40 mg total) by mouth at bedtime. 90 tablet 4   No current facility-administered medications for this visit.    PHYSICAL EXAMINATION: ECOG PERFORMANCE STATUS: 1 - Symptomatic but completely ambulatory  Vitals:   10/11/19 0843  BP: 93/65  Pulse: 94  Resp: 17  Temp: 97.6 F (36.4 C)  SpO2: 100%   Filed Weights   10/11/19 0843  Weight: 180 lb 6.4 oz (81.8 kg)    GENERAL:alert, no distress and comfortable NEURO: alert & oriented x 3 with fluent speech, no focal motor/sensory deficits  LABORATORY DATA:  I have reviewed the data as listed    Component Value Date/Time   NA 137 10/10/2019 0927   NA 137 10/27/2011 0738   K 4.2 10/10/2019 0927   K 4.1 10/27/2011 0738   CL 102 10/10/2019 0927   CL 105 10/27/2011 0738   CO2 25 10/10/2019 0927   CO2 26 10/27/2011 0738   GLUCOSE 96 10/10/2019 0927   GLUCOSE 111 (H) 10/27/2011 0738   BUN 15 10/10/2019 0927   BUN 15 10/27/2011 0738   CREATININE 0.95 10/10/2019 0927   CREATININE 1.01 (H) 12/16/2015 1030   CALCIUM 9.9 10/10/2019 0927   CALCIUM 10.0 09/28/2013 0000  PROT 7.6 10/10/2019 0927   PROT 8.1 10/27/2011 0738   ALBUMIN 3.9 10/10/2019 0927   ALBUMIN 3.9 10/27/2011 0738   AST 19 10/10/2019 0927   ALT 20 10/10/2019 0927   ALT 29 10/27/2011 0738   ALKPHOS 76 10/10/2019 0927   ALKPHOS 102 10/27/2011 0738   BILITOT 0.4 10/10/2019 0927   GFRNONAA >60 10/10/2019 0927   GFRNONAA 59 (L) 12/16/2015 1030    GFRAA >60 10/10/2019 0927   GFRAA 68 12/16/2015 1030    No results found for: SPEP, UPEP  Lab Results  Component Value Date   WBC 6.3 10/10/2019   NEUTROABS 3.0 10/10/2019   HGB 10.9 (L) 10/10/2019   HCT 33.0 (L) 10/10/2019   MCV 97.1 10/10/2019   PLT 283 10/10/2019      Chemistry      Component Value Date/Time   NA 137 10/10/2019 0927   NA 137 10/27/2011 0738   K 4.2 10/10/2019 0927   K 4.1 10/27/2011 0738   CL 102 10/10/2019 0927   CL 105 10/27/2011 0738   CO2 25 10/10/2019 0927   CO2 26 10/27/2011 0738   BUN 15 10/10/2019 0927   BUN 15 10/27/2011 0738   CREATININE 0.95 10/10/2019 0927   CREATININE 1.01 (H) 12/16/2015 1030   GLU 98 09/28/2013 0000      Component Value Date/Time   CALCIUM 9.9 10/10/2019 0927   CALCIUM 10.0 09/28/2013 0000   ALKPHOS 76 10/10/2019 0927   ALKPHOS 102 10/27/2011 0738   AST 19 10/10/2019 0927   ALT 20 10/10/2019 0927   ALT 29 10/27/2011 0738   BILITOT 0.4 10/10/2019 0927       RADIOGRAPHIC STUDIES: We reviewed multiple imaging studies I have personally reviewed the radiological images as listed and agreed with the findings in the report. CT ABDOMEN PELVIS W CONTRAST  Result Date: 10/10/2019 CLINICAL DATA:  Ovarian cancer status post chemotherapy completed in April. Bilateral salpingo-oophorectomy, omentectomy and radical tumor debulking on 07/03/2019. Restaging. EXAM: CT ABDOMEN AND PELVIS WITH CONTRAST TECHNIQUE: Multidetector CT imaging of the abdomen and pelvis was performed using the standard protocol following bolus administration of intravenous contrast. CONTRAST:  175m OMNIPAQUE IOHEXOL 300 MG/ML  SOLN COMPARISON:  06/18/2019 CT chest, abdomen and pelvis. FINDINGS: Lower chest: Two stable calcified 5 mm basilar right lower lobe pulmonary nodules (series 6/images 6 and 13). Stable calcified 5 mm medial basilar left lower lobe nodule (series 6/image 2). No acute abnormality at the lung bases. Hepatobiliary: Normal liver size.  Stable tiny calcified left liver granulomas. No liver masses. Cholecystectomy. Bile ducts are stable and within normal post cholecystectomy limits with CBD diameter 6 mm. Pancreas: Normal, with no mass or duct dilation. Spleen: Normal size spleen. No splenic mass. Punctate splenic calcified granulomas are stable. Adrenals/Urinary Tract: Normal adrenals. Normal kidneys with no hydronephrosis and no renal mass. Nondistended bladder obscured streak artifact from right hip hardware with no gross bladder abnormality. Stomach/Bowel: Small hiatal hernia. Otherwise normal nondistended stomach. Normal caliber small bowel with no small bowel wall thickening. Normal appendix. Oral contrast transits to the colon. Minimal sigmoid diverticulosis, with no large bowel wall thickening or significant pericolonic fat stranding. Vascular/Lymphatic: Atherosclerotic nonaneurysmal abdominal aorta. Patent portal, splenic, hepatic and renal veins. No pathologically enlarged lymph nodes in the abdomen or pelvis. Reproductive: Status post hysterectomy, with no abnormal findings at the hysterectomy margin. No residual or new adnexal masses. Other: No pneumoperitoneum, ascites or focal fluid collection. No discrete abdominal or pelvic peritoneal implants. Minimal smooth  peritoneal thickening in the paracolic gutters and deep pelvis. Musculoskeletal: No aggressive appearing focal osseous lesions. Right total hip arthroplasty. Moderate thoracolumbar spondylosis, most prominent at L5-S1. IMPRESSION: 1. No findings suspicious for recurrent metastatic disease in the abdomen or pelvis. No adenopathy. Minimal smooth peritoneal thickening in the paracolic gutters and deep pelvis, nonspecific, with no discrete peritoneal implants. No ascites. Continued CT surveillance warranted. 2. Chronic findings include: Aortic Atherosclerosis (ICD10-I70.0). Small hiatal hernia. Minimal sigmoid diverticulosis. Electronically Signed   By: Ilona Sorrel M.D.   On:  10/10/2019 14:06

## 2019-10-11 NOTE — Assessment & Plan Note (Signed)
I reviewed blood work and imaging studies with the patient and her husband She has achieved complete response to therapy I educated the patient the signs and symptoms to watch out for cancer recurrence I recommend port maintenance for 2 years We will continue close monitoring of tumor marker every 3 months She will see GYN surgeon in 3 months and I will see her back in 6 months

## 2019-10-11 NOTE — Assessment & Plan Note (Signed)
She is not symptomatic Observe only for now I anticipate her anemia will improve in time

## 2019-10-11 NOTE — Assessment & Plan Note (Signed)
She has residual neuropathy from treatment It is not severe I recommend her to continue taking gabapentin Once her neuropathy is less severe, she can slowly wean herself off gabapentin

## 2019-10-12 ENCOUNTER — Telehealth: Payer: Self-pay | Admitting: Hematology and Oncology

## 2019-10-12 NOTE — Telephone Encounter (Signed)
Scheduled appts per 5/27 los. Pt confirmed appt date and time.

## 2019-10-16 ENCOUNTER — Telehealth: Payer: Self-pay | Admitting: *Deleted

## 2019-10-16 NOTE — Telephone Encounter (Signed)
Called and left the patient a message to call the office; need to schedule the patient for a follow up appt in August

## 2019-10-16 NOTE — Telephone Encounter (Signed)
Patient called back and scheduled an appt for 8/18

## 2019-11-02 ENCOUNTER — Telehealth: Payer: Self-pay | Admitting: Oncology

## 2019-11-02 NOTE — Telephone Encounter (Signed)
Kathryne Hitch with message below from Dr. Alvy Bimler.  She said she has an appointment with Dr. Jacalyn Lefevre next Friday with [facility] and will discuss it with her.

## 2019-11-02 NOTE — Telephone Encounter (Signed)
Connie Heath called and said for the past week and a half, she has felt like her "nerves are on vibrate" when she tries to do anything.  She said she is really shakey.  She denies having any pain. She is wondering if physical therapy would help and is wondering if it is offered in [place].

## 2019-11-02 NOTE — Telephone Encounter (Signed)
Sounds like anxiety Probably start with primary care to see her, consider anti-anxiety medicine/counselling

## 2019-11-16 ENCOUNTER — Other Ambulatory Visit: Payer: Self-pay | Admitting: Internal Medicine

## 2019-11-16 DIAGNOSIS — Z13820 Encounter for screening for osteoporosis: Secondary | ICD-10-CM

## 2019-11-16 DIAGNOSIS — Z1231 Encounter for screening mammogram for malignant neoplasm of breast: Secondary | ICD-10-CM

## 2019-11-22 ENCOUNTER — Other Ambulatory Visit: Payer: Self-pay

## 2019-11-22 ENCOUNTER — Inpatient Hospital Stay: Payer: Medicare HMO | Attending: Gynecologic Oncology

## 2019-11-22 DIAGNOSIS — C786 Secondary malignant neoplasm of retroperitoneum and peritoneum: Secondary | ICD-10-CM | POA: Diagnosis not present

## 2019-11-22 DIAGNOSIS — Z452 Encounter for adjustment and management of vascular access device: Secondary | ICD-10-CM | POA: Diagnosis not present

## 2019-11-22 DIAGNOSIS — C561 Malignant neoplasm of right ovary: Secondary | ICD-10-CM | POA: Insufficient documentation

## 2019-11-22 MED ORDER — HEPARIN SOD (PORK) LOCK FLUSH 100 UNIT/ML IV SOLN
500.0000 [IU] | Freq: Once | INTRAVENOUS | Status: AC
  Administered 2019-11-22: 500 [IU]
  Filled 2019-11-22: qty 5

## 2019-11-22 MED ORDER — SODIUM CHLORIDE 0.9% FLUSH
10.0000 mL | Freq: Once | INTRAVENOUS | Status: AC
  Administered 2019-11-22: 10 mL
  Filled 2019-11-22: qty 10

## 2019-12-07 ENCOUNTER — Ambulatory Visit
Admission: RE | Admit: 2019-12-07 | Discharge: 2019-12-07 | Disposition: A | Discharge status: Home / Self Care | Payer: Medicare HMO | Source: Ambulatory Visit | Attending: Internal Medicine | Admitting: Internal Medicine

## 2019-12-07 ENCOUNTER — Other Ambulatory Visit: Payer: Self-pay

## 2019-12-07 DIAGNOSIS — Z1231 Encounter for screening mammogram for malignant neoplasm of breast: Secondary | ICD-10-CM

## 2019-12-12 ENCOUNTER — Other Ambulatory Visit: Payer: Self-pay | Admitting: Internal Medicine

## 2019-12-12 DIAGNOSIS — R928 Other abnormal and inconclusive findings on diagnostic imaging of breast: Secondary | ICD-10-CM

## 2019-12-13 DIAGNOSIS — E785 Hyperlipidemia, unspecified: Secondary | ICD-10-CM | POA: Diagnosis not present

## 2019-12-13 DIAGNOSIS — G620 Drug-induced polyneuropathy: Secondary | ICD-10-CM | POA: Diagnosis not present

## 2019-12-13 DIAGNOSIS — F419 Anxiety disorder, unspecified: Secondary | ICD-10-CM | POA: Diagnosis not present

## 2019-12-17 ENCOUNTER — Other Ambulatory Visit: Payer: Self-pay

## 2019-12-17 ENCOUNTER — Ambulatory Visit
Admission: RE | Admit: 2019-12-17 | Discharge: 2019-12-17 | Disposition: A | Discharge status: Home / Self Care | Payer: Medicare HMO | Source: Ambulatory Visit | Attending: Internal Medicine | Admitting: Internal Medicine

## 2019-12-17 ENCOUNTER — Other Ambulatory Visit: Payer: Self-pay | Admitting: Internal Medicine

## 2019-12-17 DIAGNOSIS — N6489 Other specified disorders of breast: Secondary | ICD-10-CM | POA: Diagnosis not present

## 2019-12-17 DIAGNOSIS — R928 Other abnormal and inconclusive findings on diagnostic imaging of breast: Secondary | ICD-10-CM

## 2019-12-20 ENCOUNTER — Ambulatory Visit
Admission: RE | Admit: 2019-12-20 | Discharge: 2019-12-20 | Disposition: A | Discharge status: Home / Self Care | Payer: Medicare HMO | Source: Ambulatory Visit | Attending: Internal Medicine | Admitting: Internal Medicine

## 2019-12-20 ENCOUNTER — Other Ambulatory Visit: Payer: Self-pay

## 2019-12-20 DIAGNOSIS — N6032 Fibrosclerosis of left breast: Secondary | ICD-10-CM | POA: Diagnosis not present

## 2019-12-20 DIAGNOSIS — R928 Other abnormal and inconclusive findings on diagnostic imaging of breast: Secondary | ICD-10-CM | POA: Diagnosis not present

## 2019-12-20 DIAGNOSIS — N6489 Other specified disorders of breast: Secondary | ICD-10-CM

## 2019-12-20 DIAGNOSIS — R921 Mammographic calcification found on diagnostic imaging of breast: Secondary | ICD-10-CM | POA: Diagnosis not present

## 2019-12-27 ENCOUNTER — Other Ambulatory Visit: Payer: Self-pay | Admitting: Gynecologic Oncology

## 2019-12-27 DIAGNOSIS — R180 Malignant ascites: Secondary | ICD-10-CM

## 2019-12-27 DIAGNOSIS — C561 Malignant neoplasm of right ovary: Secondary | ICD-10-CM

## 2019-12-27 DIAGNOSIS — C786 Secondary malignant neoplasm of retroperitoneum and peritoneum: Secondary | ICD-10-CM

## 2020-01-02 ENCOUNTER — Other Ambulatory Visit: Payer: Self-pay

## 2020-01-02 ENCOUNTER — Inpatient Hospital Stay: Payer: Medicare HMO | Attending: Gynecologic Oncology | Admitting: Gynecologic Oncology

## 2020-01-02 ENCOUNTER — Inpatient Hospital Stay: Payer: Medicare HMO

## 2020-01-02 ENCOUNTER — Telehealth: Payer: Self-pay | Admitting: Oncology

## 2020-01-02 ENCOUNTER — Other Ambulatory Visit: Payer: Self-pay | Admitting: Oncology

## 2020-01-02 ENCOUNTER — Encounter: Payer: Self-pay | Admitting: Gynecologic Oncology

## 2020-01-02 VITALS — BP 151/76 | HR 92 | Temp 97.3°F | Resp 17 | Ht 67.0 in | Wt 184.4 lb

## 2020-01-02 DIAGNOSIS — C561 Malignant neoplasm of right ovary: Secondary | ICD-10-CM

## 2020-01-02 DIAGNOSIS — E282 Polycystic ovarian syndrome: Secondary | ICD-10-CM | POA: Insufficient documentation

## 2020-01-02 DIAGNOSIS — C786 Secondary malignant neoplasm of retroperitoneum and peritoneum: Secondary | ICD-10-CM | POA: Insufficient documentation

## 2020-01-02 DIAGNOSIS — F329 Major depressive disorder, single episode, unspecified: Secondary | ICD-10-CM | POA: Diagnosis not present

## 2020-01-02 DIAGNOSIS — E785 Hyperlipidemia, unspecified: Secondary | ICD-10-CM | POA: Insufficient documentation

## 2020-01-02 DIAGNOSIS — T451X5S Adverse effect of antineoplastic and immunosuppressive drugs, sequela: Secondary | ICD-10-CM

## 2020-01-02 DIAGNOSIS — K219 Gastro-esophageal reflux disease without esophagitis: Secondary | ICD-10-CM | POA: Insufficient documentation

## 2020-01-02 DIAGNOSIS — T451X5A Adverse effect of antineoplastic and immunosuppressive drugs, initial encounter: Secondary | ICD-10-CM

## 2020-01-02 DIAGNOSIS — Z79899 Other long term (current) drug therapy: Secondary | ICD-10-CM | POA: Insufficient documentation

## 2020-01-02 DIAGNOSIS — Z9221 Personal history of antineoplastic chemotherapy: Secondary | ICD-10-CM | POA: Diagnosis not present

## 2020-01-02 DIAGNOSIS — Z90722 Acquired absence of ovaries, bilateral: Secondary | ICD-10-CM | POA: Insufficient documentation

## 2020-01-02 DIAGNOSIS — I10 Essential (primary) hypertension: Secondary | ICD-10-CM | POA: Diagnosis not present

## 2020-01-02 DIAGNOSIS — G620 Drug-induced polyneuropathy: Secondary | ICD-10-CM

## 2020-01-02 DIAGNOSIS — Z9079 Acquired absence of other genital organ(s): Secondary | ICD-10-CM | POA: Insufficient documentation

## 2020-01-02 LAB — CBC WITH DIFFERENTIAL (CANCER CENTER ONLY)
Abs Immature Granulocytes: 0.01 10*3/uL (ref 0.00–0.07)
Basophils Absolute: 0.1 10*3/uL (ref 0.0–0.1)
Basophils Relative: 1 %
Eosinophils Absolute: 0.4 10*3/uL (ref 0.0–0.5)
Eosinophils Relative: 6 %
HCT: 35.6 % — ABNORMAL LOW (ref 36.0–46.0)
Hemoglobin: 12.1 g/dL (ref 12.0–15.0)
Immature Granulocytes: 0 %
Lymphocytes Relative: 34 %
Lymphs Abs: 2.3 10*3/uL (ref 0.7–4.0)
MCH: 31.8 pg (ref 26.0–34.0)
MCHC: 34 g/dL (ref 30.0–36.0)
MCV: 93.7 fL (ref 80.0–100.0)
Monocytes Absolute: 0.8 10*3/uL (ref 0.1–1.0)
Monocytes Relative: 11 %
Neutro Abs: 3.2 10*3/uL (ref 1.7–7.7)
Neutrophils Relative %: 48 %
Platelet Count: 288 10*3/uL (ref 150–400)
RBC: 3.8 MIL/uL — ABNORMAL LOW (ref 3.87–5.11)
RDW: 12.3 % (ref 11.5–15.5)
WBC Count: 6.8 10*3/uL (ref 4.0–10.5)
nRBC: 0 % (ref 0.0–0.2)

## 2020-01-02 LAB — CMP (CANCER CENTER ONLY)
ALT: 37 U/L (ref 0–44)
AST: 28 U/L (ref 15–41)
Albumin: 3.9 g/dL (ref 3.5–5.0)
Alkaline Phosphatase: 75 U/L (ref 38–126)
Anion gap: 10 (ref 5–15)
BUN: 18 mg/dL (ref 8–23)
CO2: 24 mmol/L (ref 22–32)
Calcium: 10.3 mg/dL (ref 8.9–10.3)
Chloride: 99 mmol/L (ref 98–111)
Creatinine: 1.12 mg/dL — ABNORMAL HIGH (ref 0.44–1.00)
GFR, Est AFR Am: 59 mL/min — ABNORMAL LOW (ref >60)
GFR, Estimated: 51 mL/min — ABNORMAL LOW (ref >60)
Glucose, Bld: 84 mg/dL (ref 70–99)
Potassium: 4.2 mmol/L (ref 3.5–5.1)
Sodium: 133 mmol/L — ABNORMAL LOW (ref 135–145)
Total Bilirubin: 0.3 mg/dL (ref 0.3–1.2)
Total Protein: 7.4 g/dL (ref 6.5–8.1)

## 2020-01-02 MED ORDER — SODIUM CHLORIDE 0.9% FLUSH
10.0000 mL | Freq: Once | INTRAVENOUS | Status: AC
  Administered 2020-01-02: 10 mL
  Filled 2020-01-02: qty 10

## 2020-01-02 MED ORDER — HEPARIN SOD (PORK) LOCK FLUSH 100 UNIT/ML IV SOLN
500.0000 [IU] | Freq: Once | INTRAVENOUS | Status: AC
  Administered 2020-01-02: 500 [IU]
  Filled 2020-01-02: qty 5

## 2020-01-02 NOTE — Progress Notes (Signed)
Follow-up Note: Gyn-Onc  CC:  Chief Complaint  Patient presents with  . Ovarian cancer on right [facility])    Assessment/Plan:  Ms. SAHVANNAH RIESER  is a 69 y.o.  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  S/p interval optional cytoreduction to R0 on 2/16.  s/p 3 cycles adjuvant carb/taxol completed 09/10/19.   I'll see her back in 6 months (Dr Alvy Bimler will see in 3 months).   Referral made [facility] for physical therapy for neuropathy/balance.   HPI:  Ms Sabine Tenenbaum is a 69 year old P0 who was seen as a referral from the [facility] ED for ascites and a right pelvic mass with peritoneal nodularity.   Saw a gynecologist in [place] for dyspareunia in September, 2020. She reported having a normal examination.  In mid November, 2020 she began feeling unwell with "fevers" (none >100.3) and malaise and intractable cough. She was tested for COVID on November 21st, 2020.  She took anti-tussives, augmentin and this improved her respiratory symptoms. However on 04/12/19 she began experiencing abdominal fullness and abdominal pains in the right lower quadrant.    Ca125 was drawn on April 17, 2019 and was elevated at 288.  She underwent paracentesis for 3 L of peritoneal fluid on April 18, 2019 which revealed scattered malignant cells with immunostains consistent for a gynecologic primary, positive for cytokeratin 7, PAX 8, and WT1.  She commenced chemotherapy on April 27, 2019 with carboplatin paclitaxel.  She received dosing every 21 days with day 1 of cycle 3 administered on June 11, 2019.  On day 1 of cycle 3 her CA-125 had decreased to 20.8.  Follow-up CT scan of the abdomen and pelvis performed on 31st 2021 revealed good partial response to neoadjuvant chemotherapy with a reduction in the size of a complex cystic right adnexal mass to 6.6 cm, omental nodularity had largely resolved.  The ascites  had resolved and there was no adenopathy present.  There were no metastatic disease in the chest identified.  On 07/03/19 she underwent robotic assisted BSO omentectomy radical tumor debulking to R0.  Intraoperative findings were significant for an excellent response to neoadjuvant chemotherapy.  There was a six 8 cm right ovarian mass densely adherent to the pelvic sidewall.  Normal appendix.  The left tube and ovary were free of obvious masses.  There was omental nodularity throughout the infracolic omentum.  At the completion of the procedure there was no gross macroscopic disease remaining.  Genetic testing was negative for BRCA mutations/HRD. She demonstrated a VUS for MRE11A.   Surgery was uncomplicated as was her postoperative recovery.  Final pathology confirmed a carcinosarcoma of the right tube and ovary with high grade serous metastasis to the left tube and ovary predominantly on the surface and the omentum with the largest metastatic deposit measuring 4.3 cm.  She was confirmed a stage IIIc.  Interval Hx:  Postoperatively she completed 3 adjuvant cycles of carboplatin and paclitaxel. This was completed on 09/10/19 when CA 125 was normal at 9.5.   Post treatment imaging on 10/10/19 showed no recurrent/persistent disease (complete clinical response).  CA 125 was normal on 10/10/19 at 8.9.  She has bothersome neuropathy in her feet.   Current Meds:  Outpatient Encounter Medications as of 01/02/2020  Medication Sig  . B Complex Vitamins (VITAMIN B COMPLEX PO) Take 1 tablet by mouth daily.  . Cholecalciferol (VITAMIN D-3 PO) Take  4,000 Units by mouth daily.   Marland Kitchen estradiol (ESTRACE VAGINAL) 0.1 MG/GM vaginal cream Place 1 Applicatorful vaginally 3 (three) times a week. (Patient taking differently: Place 1 Applicatorful vaginally as needed. )  . FLUoxetine (PROZAC) 20 MG capsule Take 1 capsule (20 mg total) by mouth daily.  Marland Kitchen lidocaine-prilocaine (EMLA) cream Apply to affected area once  (Patient taking differently: Apply 1 application topically daily as needed (port access). )  . loratadine (CLARITIN) 10 MG tablet Take 10 mg by mouth daily as needed for allergies.  . simvastatin (ZOCOR) 40 MG tablet Take 1 tablet (40 mg total) by mouth at bedtime.  . [DISCONTINUED] gabapentin (NEURONTIN) 300 MG capsule Take 1 capsule (300 mg total) by mouth 3 (three) times daily. (Patient taking differently: Take 300 mg by mouth at bedtime. )  . [DISCONTINUED] lansoprazole (PREVACID) 15 MG capsule Take 15 mg by mouth at bedtime.   No facility-administered encounter medications on file as of 01/02/2020.    Allergy:  Allergies  Allergen Reactions  . Bee Venom Anaphylaxis  . Amlodipine Swelling    also ineffective  . Demerol [Meperidine Hcl] Nausea And Vomiting       . Lisinopril Cough  . Losartan     Bladder pain  . Other Rash    Dermabond Surgical glue- rash, whelps    Social Hx:   Social History   Socioeconomic History  . Marital status: Married    Spouse name: Not on file  . Number of children: Not on file  . Years of education: Not on file  . Highest education level: Not on file  Occupational History  . 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 Smoking status: Never Smoker  . Smokeless tobacco: Never Used  Vaping Use  . Vaping Use: Never used  Substance and Sexual Activity  . Alcohol use: Yes    Comment: Occasional  . Drug use: No  . Sexual activity: Not Currently  Other Topics Concern  . Not on file  Social History Narrative   Married   Gets regular exercise   Social Determinants of Health   Financial Resource Strain:   . Difficulty of Paying Living Expenses:   Food Insecurity:   . Worried About Charity fundraiser in the Last Year:   . Arboriculturist in the Last Year:   Transportation Needs:   . Film/video editor (Medical):   Marland Kitchen Lack of Transportation (Non-Medical):   Physical  Activity:   . Days of Exercise per Week:   . Minutes of Exercise per Session:   Stress:   . Feeling of Stress :   Social Connections:   . Frequency of Communication with Friends and Family:   . Frequency of Social Gatherings with Friends and Family:   . Attends Religious Services:   . Active Member of Clubs or Organizations:   . Attends Archivist Meetings:   Marland Kitchen Marital Status:   Intimate Partner Violence:   . Fear of Current or Ex-Partner:   . Emotionally Abused:   Marland Kitchen Physically Abused:   . Sexually Abused:     Past Surgical Hx:  Past Surgical History:  Procedure Laterality Date  . DIAGNOSTIC LAPAROSCOPY    . IR IMAGING GUIDED PORT INSERTION  05/17/2019  . JOINT REPLACEMENT     right total hip   . LAPAROSCOPIC CHOLECYSTECTOMY    . PARACENTESIS    .  PARATHYROIDECTOMY Right 2013  . PARTIAL HYSTERECTOMY    . TONSILLECTOMY AND ADENOIDECTOMY      Past Medical Hx:  Past Medical History:  Diagnosis Date  . #[id number] dx'd 03/2019   ovarian cancer   . Depression   . Dyspnea    with increased exertion   . Family history of lung cancer   . Family history of stomach cancer   . Fatigue   . GERD (gastroesophageal reflux disease)   . Hyperlipidemia    H/o myalgias with Vytorin but tolerating simvastatin  . Hypertension   . Myalgia    Hx of   . S/P abdominal paracentesis 04/25/2019   pulled off 2.5 liters  . S/P cholecystectomy   . S/P hysterectomy   . Shingles     Past Gynecological History:  Endometriosis, PCOS, P0, infertility (primary). No LMP recorded. Patient has had a hysterectomy.  Family Hx:  Family History  Problem Relation Age of Onset  . Lung cancer Mother   . Cancer Father 74       Stomach  . Heart failure Maternal Grandmother 80       CHF  . Heart failure Maternal Grandfather 50       Acute MI  . Cancer Paternal Aunt        either colon or liver    Review of Systems:  Constitutional  Feels fatigued, malaise  ENT Normal appearing ears  and nares bilaterally Skin/Breast  No rash, sores, jaundice, itching, dryness Cardiovascular  No chest pain, shortness of breath, or edema  Pulmonary  No cough or wheeze.  Gastro Intestinal  no loose stools.  Genito Urinary  No frequency, urgency, dysuria,  Musculo Skeletal  No myalgia, arthralgia, joint swelling or pain  Neurologic  No weakness, numbness, change in gait,  Psychology  No depression, anxiety, insomnia.   Vitals:  Blood pressure (!) 151/76, pulse 92, temperature (!) 97.3 F (36.3 C), temperature source Tympanic, resp. rate 17, height _0  (1.702 m), weight 184 lb 6.4 oz (83.6 kg), SpO2 100 %.  Physical Exam: WD in NAD Neck  Supple NROM, without any enlargements.  Lymph Node Survey No cervical supraclavicular or inguinal adenopathy Cardiovascular  Pulse normal rate, regularity and rhythm. S1 and S2 normal.  Lungs  Clear to auscultation bilateraly, without wheezes/crackles/rhonchi. Good air movement.  Skin  No rash/lesions/breakdown  Psychiatry  Alert and oriented to person, place, and time  Abdomen  Normoactive bowel sounds, abdomen soft, non-tender and obese without evidence of hernia. No palpable masses. Well healed incisions. Back No CVA tenderness Genito Urinary  No palpable pelvic masses Rectal  deferred Extremities  No bilateral cyanosis, clubbing or edema.   Thereasa Solo, MD  01/02/2020, 2:18 PM

## 2020-01-02 NOTE — Patient Instructions (Signed)
Please notify Dr Denman George at phone number [phone] if you notice vaginal bleeding, new pelvic or abdominal pains, bloating, feeling full easy, or a change in bladder or bowel function.   Please have Dr Calton Dach office contact Dr Serita Grit office (at [phone]) in November after your appointment with her to request an appointment with Dr Denman George for February, 2022.  A referral will be made to [facility] for PT for neuropathy.

## 2020-01-02 NOTE — Telephone Encounter (Signed)
Called in referral to [facility].  They said due to her insurance, she may have a lower cost if she goes to [place] location.  They have sent the referral to [facility] and they will call the patient with an appointment.  Called [place] and let her know that Jule Ser will be calling her with an appointment.

## 2020-01-03 ENCOUNTER — Other Ambulatory Visit: Payer: Medicare HMO

## 2020-01-03 ENCOUNTER — Telehealth: Payer: Self-pay

## 2020-01-03 LAB — CA 125: Cancer Antigen (CA) 125: 8.2 U/mL (ref 0.0–38.1)

## 2020-01-03 NOTE — Telephone Encounter (Signed)
Ca-125 results reported to patient.  Patient pleased with result.

## 2020-01-08 ENCOUNTER — Ambulatory Visit (INDEPENDENT_AMBULATORY_CARE_PROVIDER_SITE_OTHER): Payer: Medicare HMO | Admitting: Rehabilitative and Restorative Service Providers"

## 2020-01-08 ENCOUNTER — Encounter: Payer: Self-pay | Admitting: Rehabilitative and Restorative Service Providers"

## 2020-01-08 ENCOUNTER — Other Ambulatory Visit: Payer: Self-pay

## 2020-01-08 DIAGNOSIS — R29818 Other symptoms and signs involving the nervous system: Secondary | ICD-10-CM | POA: Diagnosis not present

## 2020-01-08 DIAGNOSIS — R2689 Other abnormalities of gait and mobility: Secondary | ICD-10-CM

## 2020-01-08 NOTE — Therapy (Deleted)
[facility] [place], [place], [address] Phone: [phone]   Fax:  [phone]  Physical Therapy Evaluation  Patient Details  Name: Connie Heath MRN: [id number] Date of Birth: January 03, 1951 Referring Provider (PT): Everitt Amber, MD   Encounter Date: 01/08/2020   PT End of Session - 01/08/20 2117    Visit Number 1    Number of Visits 12    Date for PT Re-Evaluation 02/19/20    Authorization Type humana medicare-  awaiting authorization    Authorization - Visit Number 1    Authorization - Number of Visits 12    PT Start Time [phone]    PT Stop Time 1020    PT Time Calculation (min) 42 min    Activity Tolerance Patient tolerated treatment well    Behavior During Therapy [facility] for tasks assessed/performed           Past Medical History:  Diagnosis Date  . #[id number] dx'd 03/2019   ovarian cancer   . Depression   . Dyspnea    with increased exertion   . Family history of lung cancer   . Family history of stomach cancer   . Fatigue   . GERD (gastroesophageal reflux disease)   . Hyperlipidemia    H/o myalgias with Vytorin but tolerating simvastatin  . Hypertension   . Myalgia    Hx of   . S/P abdominal paracentesis 04/25/2019   pulled off 2.5 liters  . S/P cholecystectomy   . S/P hysterectomy   . Shingles     Past Surgical History:  Procedure Laterality Date  . DIAGNOSTIC LAPAROSCOPY    . IR IMAGING GUIDED PORT INSERTION  05/17/2019  . JOINT REPLACEMENT     right total hip   . LAPAROSCOPIC CHOLECYSTECTOMY    . PARACENTESIS    . PARATHYROIDECTOMY Right 2013  . PARTIAL HYSTERECTOMY    . TONSILLECTOMY AND ADENOIDECTOMY      There were no vitals filed for this visit.    Subjective Assessment - 01/08/20 0939    Subjective The patient's last chemo treatment was April 26 and she is noting increasing sensory symptoms due to peripheral neuropathy since chemo treatment.  If she is walking in her yard  or at the beach, she has imbalance.  She also notes dec'd fine motor skills in her fingers noting L hand improvement, but continued R hand difficulty.  The patient does painting, sewing, etc that is hindered with numbness in R hand.    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 No/denies   occasional pain, not primary concern             [facility] PT Assessment - 01/08/20 0942      Assessment   Medical Diagnosis periheral neuropathy s/p chemotherapy, imbalance    Referring Provider (PT) Everitt Amber, MD    Onset Date/Surgical Date 09/10/19   last chemo treatment   Hand Dominance Right      Precautions   Precautions None      Restrictions   Weight Bearing Restrictions No  Balance Screen   Has the patient fallen in the past 6 months No    Has the patient had a decrease in activity level because of a fear of falling?  No    Is the patient reluctant to leave their home because of a fear of falling?  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  Plantar surface and toes are continuing with numbness.       Functional Tests   Functional tests --   putting back of earrings in is limited with R hand     ROM / Strength   AROM / PROM / Strength AROM;Strength      AROM   Overall AROM  Within functional limits for tasks performed      Strength   Overall Strength Within functional limits for tasks performed    Overall Strength Comments 5/5 bilat shoulder strength, elbow flexion, hip flexion, knee flexion/extension, ankle DF    Strength Assessment Site Hand    Right/Left hand Right;Left    Right Hand Grip (lbs) 68    Right Hand Lateral Pinch 6 lbs    Left Hand Grip (lbs) 62    Left Hand Lateral Pinch 5 lbs      Ambulation/Gait   Ambulation/Gait Yes    Ambulation/Gait Assistance 7: Independent    Ambulation Distance (Feet) 100 Feet    Assistive device None    Ambulation Surface Level;Indoor      High Level Balance   High Level Balance Comments tandem stance 30 seconds with significant work to maintain, SLS L 3 seconds, R 10 seconds      Functional Gait  Assessment   Gait assessed  Yes    Gait Level Surface Walks 20 ft in less than 5.5 sec, no assistive devices, good speed, no evidence for imbalance, normal gait pattern, deviates no more than 6 in outside of the 12 in walkway width.    Change in Gait Speed Able to smoothly change walking speed without loss of balance or gait deviation. Deviate no more than 6 in outside of the 12 in walkway width.    Gait with Horizontal Head Turns Performs head turns smoothly with no change in gait. Deviates no more than 6 in outside 12 in walkway width    Gait with Vertical Head Turns Performs head turns with no change in gait. Deviates no more than 6 in outside 12 in walkway width.    Gait and Pivot Turn Pivot turns safely within 3 sec and stops quickly with no loss of balance.    Gait with Narrow Base of Support Ambulates 4-7 steps.    Gait with Eyes Closed Walks 20 ft, no  assistive devices, good speed, no evidence of imbalance, normal gait pattern, deviates  no more than 6 in outside 12 in walkway width. Ambulates 20 ft in less than 7 sec.    Ambulating Backwards Walks 20 ft, no assistive devices, good speed, no evidence for imbalance, normal gait    FGA comment: *did not complete full test (to do steps and obstacle)                      Objective measurements completed on examination: See above findings.       [facility] PT Treatment/Exercise - 01/08/20 3267      Neuro Re-ed    Neuro Re-ed Details  Corner balance exercises performing tandem stance, tandem gait near wall, and single leg stance.                  PT Education - 01/08/20 2116    Education Details HEP    Person(s) Educated Patient    Methods Explanation;Demonstration;Handout    Comprehension Returned demonstration;Verbalized understanding               PT Long Term Goals - 01/08/20 2118      PT LONG TERM GOAL #1   Title The patient will be independent with HEP for LE strengthening, balance, neural gliding (UE/LE), and general mobility.    Time 6    Period Weeks    Target Date 02/19/20      PT LONG TERM GOAL #2   Title The patient will reduce functional limitation per FOTO from 40% limitation to < or equal to 30% limitation.    Time 6    Period Weeks    Target Date 02/19/20      PT LONG TERM GOAL #3   Title The patient will demonstrate single limb stance x 10 seconds bilaterally to demo improved balance control for high level tasks.    Time 6    Period Weeks    Target Date 02/19/20      PT LONG TERM GOAL #4   Title The patient will report return to unlevel surface negotiation for hiking x 1-2 miles.    Time 6    Period Weeks    Target Date 02/19/20      PT LONG TERM GOAL #5   Title The patient will be further assessed on R hand fine motor skills and goal to follow.    Time 6    Period Weeks    Target Date 02/19/20                   Plan - 01/08/20 2125    Clinical Impression Statement The patient is a 69 year old female presenting to OP physical therapy s/p chemotherapy for R ovarian cancer with resultant neuropathy impacting balance and UE fine motor tasks.  She presents with impairments in high level balance, dynamic gait, R UE sensation, bilateral plantar sensation, muti-sensory balance deficit and decreased activity tolerance.  These impairments lead to functional limitations of dec'd performance of recreational activities (walking on beach, hiking, art and sewing), dec'd IADL performance.  PT to address deficits to work to return to prior functional status.    Personal Factors and Comorbidities Comorbidity 1    Comorbidities stage III ovarian cancer    Examination-Activity Limitations Locomotion Level    Examination-Participation Restrictions Community Activity;Other   dec'd ability to perform hobbies   Stability/Clinical Decision Making Stable/Uncomplicated    Clinical Decision Making Low    Rehab Potential Good    PT Frequency 2x /  week    PT Duration 6 weeks    PT Treatment/Interventions ADLs/Self Care Home Management;Gait training;Stair training;Functional mobility training;Therapeutic activities;Therapeutic exercise;Taping;Patient/family education;Manual techniques;Neuromuscular re-education;Balance training    PT Next Visit Plan further assess R wrist for CTS, neural gliding, and establish HEP for fine motor tasks; high level balance; multi-sensory balance training.    PT Home Exercise Plan Access Code: [email or web address]    [id number] and Agree with Plan of Care Patient           Patient will benefit from skilled therapeutic intervention in order to improve the following deficits and impairments:  Abnormal gait, Decreased balance, Decreased activity tolerance, Impaired sensation   Visit Diagnosi   Problem List Patient Active Problem List   Diagnosis Date Noted  . Anemia due to antineoplastic chemotherapy  10/11/2019  . Atrophic vaginitis 08/17/2019  . Genetic testing 08/14/2019  . Peripheral neuropathy due to and not concurrent with chemotherapy ([place]) 05/21/2019  . Family history of stomach cancer   . Family history of lung cancer   . GERD (gastroesophageal reflux disease) 05/01/2019  . Reactive thrombocytosis 04/20/2019  . Leukocytosis 04/20/2019  . Cancer associated pain 04/20/2019  . Ovarian cancer on right ([place]) 04/18/2019  . Hyperparathyroidism ([place]) 10/22/2014  . Extrinsic asthma 09/26/2014  . Fibromyalgia 09/26/2014  . Poor concentration 08/08/2014  . Family history of colon cancer 08/08/2014  . Insomnia 08/08/2014  . Prediabetes 08/08/2014  . Essential hypertension 07/19/2014  . Hyperlipidemia 07/19/2014  . Hypercalcemia 07/19/2014  . Depression 07/19/2014  . ABNORMAL EKG 12/04/2008    Jersey Espinoza 01/08/2020, 9:34 PM  [facility] [place], [place], [address] Phone: [phone]   Fax:  [phone]  Name: LIZA CZERWINSKI MRN: [id number] Date of Birth: 1950/09/02

## 2020-01-08 NOTE — Therapy (Signed)
[place], [place], [address] Phone: [phone]   Fax:  [phone]  Physical Therapy Evaluation  Patient Details  Name: Connie Heath MRN: [id number] Date of Birth: 1950-06-09 Referring Provider (PT): Everitt Amber, MD   Encounter Date: 01/08/2020   PT End of Session - 01/08/20 2117    Visit Number 1    Number of Visits 12    Date for PT Re-Evaluation 02/19/20    Authorization Type humana medicare-  awaiting authorization    Authorization - Visit Number 1    Authorization - Number of Visits 12    PT Start Time [phone]    PT Stop Time 1020    PT Time Calculation (min) 42 min    Activity Tolerance Patient tolerated treatment well    Behavior During Therapy [facility] for tasks assessed/performed           Past Medical History:  Diagnosis Date  . #[id number] dx'd 03/2019   ovarian cancer   . Depression   . Dyspnea    with increased exertion   . Family history of lung cancer   . Family history of stomach cancer   . Fatigue   . GERD (gastroesophageal reflux disease)   . Hyperlipidemia    H/o myalgias with Vytorin but tolerating simvastatin  . Hypertension   . Myalgia    Hx of   . S/P abdominal paracentesis 04/25/2019   pulled off 2.5 liters  . S/P cholecystectomy   . S/P hysterectomy   . Shingles     Past Surgical History:  Procedure Laterality Date  . DIAGNOSTIC LAPAROSCOPY    . IR IMAGING GUIDED PORT INSERTION  05/17/2019  . JOINT REPLACEMENT     right total hip   . LAPAROSCOPIC CHOLECYSTECTOMY    . PARACENTESIS    . PARATHYROIDECTOMY Right 2013  . PARTIAL HYSTERECTOMY    . TONSILLECTOMY AND ADENOIDECTOMY      There were no vitals filed for this visit.    Subjective Assessment - 01/08/20 0939    Subjective The patient's last chemo treatment was April 26 and she is noting increasing sensory symptoms due to peripheral neuropathy since chemo treatment.  If she is walking in her yard  or at the beach, she has imbalance.  She also notes dec'd fine motor skills in her fingers noting L hand improvement, but continued R hand difficulty.  The patient does painting, sewing, etc that is hindered with numbness in R hand.    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 No/denies   occasional pain, not primary concern             [facility] PT Assessment - 01/08/20 0942      Assessment   Medical Diagnosis periheral neuropathy s/p chemotherapy, imbalance    Referring Provider (PT) Everitt Amber, MD    Onset Date/Surgical Date 09/10/19   last chemo treatment   Hand Dominance Right      Precautions   Precautions None      Restrictions   Weight Bearing Restrictions No  Balance Screen   Has the patient fallen in the past 6 months No    Has the patient had a decrease in activity level because of a fear of falling?  No    Is the patient reluctant to leave their home because of a fear of falling?  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  Plantar surface and toes are continuing with numbness.       Functional Tests   Functional tests --   putting back of earrings in is limited with R hand     ROM / Strength   AROM / PROM / Strength AROM;Strength      AROM   Overall AROM  Within functional limits for tasks performed      Strength   Overall Strength Within functional limits for tasks performed    Overall Strength Comments 5/5 bilat shoulder strength, elbow flexion, hip flexion, knee flexion/extension, ankle DF    Strength Assessment Site Hand    Right/Left hand Right;Left    Right Hand Grip (lbs) 68    Right Hand Lateral Pinch 6 lbs    Left Hand Grip (lbs) 62    Left Hand Lateral Pinch 5 lbs      Ambulation/Gait   Ambulation/Gait Yes    Ambulation/Gait Assistance 7: Independent    Ambulation Distance (Feet) 100 Feet    Assistive device None    Ambulation Surface Level;Indoor      High Level Balance   High Level Balance Comments tandem stance 30 seconds with significant work to maintain, SLS L 3 seconds, R 10 seconds      Functional Gait  Assessment   Gait assessed  Yes    Gait Level Surface Walks 20 ft in less than 5.5 sec, no assistive devices, good speed, no evidence for imbalance, normal gait pattern, deviates no more than 6 in outside of the 12 in walkway width.    Change in Gait Speed Able to smoothly change walking speed without loss of balance or gait deviation. Deviate no more than 6 in outside of the 12 in walkway width.    Gait with Horizontal Head Turns Performs head turns smoothly with no change in gait. Deviates no more than 6 in outside 12 in walkway width    Gait with Vertical Head Turns Performs head turns with no change in gait. Deviates no more than 6 in outside 12 in walkway width.    Gait and Pivot Turn Pivot turns safely within 3 sec and stops quickly with no loss of balance.    Gait with Narrow Base of Support Ambulates 4-7 steps.    Gait with Eyes Closed Walks 20 ft, no  assistive devices, good speed, no evidence of imbalance, normal gait pattern, deviates  no more than 6 in outside 12 in walkway width. Ambulates 20 ft in less than 7 sec.    Ambulating Backwards Walks 20 ft, no assistive devices, good speed, no evidence for imbalance, normal gait    FGA comment: *did not complete full test (to do steps and obstacle)                      Objective measurements completed on examination: See above findings.       [facility] Adult PT Treatment/Exercise - 01/08/20 7619      Neuro Re-ed    Neuro Re-ed Details  Corner balance exercises performing tandem stance, tandem gait near wall, and single leg stance.                  PT Education - 01/08/20 2116    Education Details HEP    Person(s) Educated Patient    Methods Explanation;Demonstration;Handout    Comprehension Returned demonstration;Verbalized understanding               PT Long Term Goals - 01/08/20 2118      PT LONG TERM GOAL #1   Title The patient will be independent with HEP for LE strengthening, balance, neural gliding (UE/LE), and general mobility.    Time 6    Period Weeks    Target Date 02/19/20      PT LONG TERM GOAL #2   Title The patient will reduce functional limitation per FOTO from 40% limitation to < or equal to 30% limitation.    Time 6    Period Weeks    Target Date 02/19/20      PT LONG TERM GOAL #3   Title The patient will demonstrate single limb stance x 10 seconds bilaterally to demo improved balance control for high level tasks.    Time 6    Period Weeks    Target Date 02/19/20      PT LONG TERM GOAL #4   Title The patient will report return to unlevel surface negotiation for hiking x 1-2 miles.    Time 6    Period Weeks    Target Date 02/19/20      PT LONG TERM GOAL #5   Title The patient will be further assessed on R hand fine motor skills and goal to follow.    Time 6    Period Weeks    Target Date 02/19/20                   Plan - 01/08/20 2125    Clinical Impression Statement The patient is a 70 year old female presenting to OP physical therapy s/p chemotherapy for R ovarian cancer with resultant neuropathy impacting balance and UE fine motor tasks.  She presents with impairments in high level balance, dynamic gait, R UE sensation, bilateral plantar sensation, muti-sensory balance deficit and decreased activity tolerance.  These impairments lead to functional limitations of dec'd performance of recreational activities (walking on beach, hiking, art and sewing), dec'd IADL performance.  PT to address deficits to work to return to prior functional status.    Personal Factors and Comorbidities Comorbidity 1    Comorbidities stage III ovarian cancer    Examination-Activity Limitations Locomotion Level    Examination-Participation Restrictions Community Activity;Other   dec'd ability to perform hobbies   Stability/Clinical Decision Making Stable/Uncomplicated    Clinical Decision Making Low    Rehab Potential Good    PT Frequency 2x /  week    PT Duration 6 weeks    PT Treatment/Interventions ADLs/Self Care Home Management;Gait training;Stair training;Functional mobility training;Therapeutic activities;Therapeutic exercise;Taping;Patient/family education;Manual techniques;Neuromuscular re-education;Balance training    PT Next Visit Plan further assess R wrist for CTS, neural gliding, and establish HEP for fine motor tasks; high level balance; multi-sensory balance training.    PT Home Exercise Plan Access Code: [email or web address]    [email or web address] and Agree with Plan of Care Patient           Patient will benefit from skilled therapeutic intervention in order to improve the following deficits and impairments:  Abnormal gait, Decreased balance, Decreased activity tolerance, Impaired sensation  Visit Diagnosis: Other abnormalities of gait and mobility  Other symptoms and signs involving the nervous system     Problem  List Patient Active Problem List   Diagnosis Date Noted  . Anemia due to antineoplastic chemotherapy 10/11/2019  . Atrophic vaginitis 08/17/2019  . Genetic testing 08/14/2019  . Peripheral neuropathy due to and not concurrent with chemotherapy ([place]) 05/21/2019  . Family history of stomach cancer   . Family history of lung cancer   . GERD (gastroesophageal reflux disease) 05/01/2019  . Reactive thrombocytosis 04/20/2019  . Leukocytosis 04/20/2019  . Cancer associated pain 04/20/2019  . Ovarian cancer on right ([place]) 04/18/2019  . Hyperparathyroidism ([place]) 10/22/2014  . Extrinsic asthma 09/26/2014  . Fibromyalgia 09/26/2014  . Poor concentration 08/08/2014  . Family history of colon cancer 08/08/2014  . Insomnia 08/08/2014  . Prediabetes 08/08/2014  . Essential hypertension 07/19/2014  . Hyperlipidemia 07/19/2014  . Hypercalcemia 07/19/2014  . Depression 07/19/2014  . ABNORMAL EKG 12/04/2008    Lakesia Dahle, PT 01/08/2020, 9:35 PM  [facility] [place], [place], [address] Phone: [phone]   Fax:  [phone]  Name: ERMELINDA ECKERT MRN: [id number] Date of Birth: 05-10-1951

## 2020-01-08 NOTE — Patient Instructions (Signed)
Access Code: [email or web address] URL: [email or web address] Date: 01/08/2020 Prepared by: Rudell Cobb  Exercises Tandem Stance with Support - 2 x daily - 7 x weekly - 1 sets - 3 reps - 30 seconds hold Walking Tandem Stance - 2 x daily - 7 x weekly - 1 sets - 10 reps Single Leg Stance with Support - 2 x daily - 7 x weekly - 1 sets - 3 reps - 10-15 seconds hold

## 2020-01-10 ENCOUNTER — Other Ambulatory Visit: Payer: Self-pay

## 2020-01-10 ENCOUNTER — Encounter: Payer: Medicare HMO | Admitting: Rehabilitative and Restorative Service Providers"

## 2020-01-10 ENCOUNTER — Ambulatory Visit (INDEPENDENT_AMBULATORY_CARE_PROVIDER_SITE_OTHER): Payer: Medicare HMO | Admitting: Rehabilitative and Restorative Service Providers"

## 2020-01-10 DIAGNOSIS — R29818 Other symptoms and signs involving the nervous system: Secondary | ICD-10-CM | POA: Diagnosis not present

## 2020-01-10 DIAGNOSIS — R2689 Other abnormalities of gait and mobility: Secondary | ICD-10-CM

## 2020-01-10 NOTE — Patient Instructions (Signed)
Access Code: [email or web address] URL: [email or web address] Date: 01/10/2020 Prepared by: Rudell Cobb  Program Notes For arm and upper body stretches, numbness should reduce within 15 minutes of exercise.   Exercises Tandem Stance with Support - 2 x daily - 7 x weekly - 1 sets - 3 reps - 30 seconds hold Walking Tandem Stance - 2 x daily - 7 x weekly - 1 sets - 10 reps Single Leg Stance with Support - 2 x daily - 7 x weekly - 1 sets - 3 reps - 10-15 seconds hold Sideways Walking - 2 x daily - 7 x weekly - 1 sets - 10 reps Romberg Stance on Foam Pad with Head Rotation - 2 x daily - 7 x weekly - 1 sets - 10 reps Wide Stance with Eyes Closed on Foam Pad - 2 x daily - 7 x weekly - 1 sets - 3 reps - 30 seconds hold Standing Pec Stretch at Wall - 2 x daily - 7 x weekly - 1 sets - 3 reps - 20 seconds hold Median nerve abduction tensioners - 2 x daily - 7 x weekly - 1 sets - 10 reps Standing Gaze Stabilization with Head Rotation - 2 x daily - 7 x weekly - 1 sets - 1 reps - 30 seconds hold

## 2020-01-10 NOTE — Therapy (Signed)
[place], [place], [address] Phone: [phone]   Fax:  [phone]  Physical Therapy Treatment  Patient Details  Name: Connie Heath MRN: [id number] Date of Birth: 01-26-1951 Referring Provider (PT): Everitt Amber, MD   Encounter Date: 01/10/2020   PT End of Session - 01/10/20 1304    Visit Number 2    Number of Visits 12    Date for PT Re-Evaluation 02/19/20    Authorization Type humana medicare-  awaiting authorization    Authorization - Visit Number 2    Authorization - Number of Visits 12    PT Start Time 1018    PT Stop Time 1100    PT Time Calculation (min) 42 min    Activity Tolerance Patient tolerated treatment well    Behavior During Therapy [facility] for tasks assessed/performed           Past Medical History:  Diagnosis Date  . #[id number] dx'd 03/2019   ovarian cancer   . Depression   . Dyspnea    with increased exertion   . Family history of lung cancer   . Family history of stomach cancer   . Fatigue   . GERD (gastroesophageal reflux disease)   . Hyperlipidemia    H/o myalgias with Vytorin but tolerating simvastatin  . Hypertension   . Myalgia    Hx of   . S/P abdominal paracentesis 04/25/2019   pulled off 2.5 liters  . S/P cholecystectomy   . S/P hysterectomy   . Shingles     Past Surgical History:  Procedure Laterality Date  . DIAGNOSTIC LAPAROSCOPY    . IR IMAGING GUIDED PORT INSERTION  05/17/2019  . JOINT REPLACEMENT     right total hip   . LAPAROSCOPIC CHOLECYSTECTOMY    . PARACENTESIS    . PARATHYROIDECTOMY Right 2013  . PARTIAL HYSTERECTOMY    . TONSILLECTOMY AND ADENOIDECTOMY      There were no vitals filed for this visit.   Subjective Assessment - 01/10/20 1019    Subjective Single leg stance is harder on the L side and she feels improvement when doing barefoot.  She is doing HEP regularly.    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 No/denies                             [facility] Adult PT Treatment/Exercise - 01/10/20 1029      Neuro Re-ed    Neuro Re-ed Details  Multi-sensory balance training on foam with eyes closed and wide base of support, narrow base with eyes open and head rotation.  Also performed single leg stance and added side stepping for hip stabiity training.      Exercises   Exercises Shoulder;Neck;Hand;Knee/Hip      Neck Exercises: Standing   Other Standing Exercises standing scapular retraction and L's x 12 reps with pool noodle for cues      Shoulder Exercises: Stretch   Corner Stretch Limitations Pec stretch at wall      Hand  Exercises   Tendon Glides Median tendon glides and wall stretch with elbow flexion/extension.  Perfomred tendon glides t/o the R UE with wall press and adding neck rotation and side bending                  PT Education - 01/10/20 1058    Education Details HEP    Person(s) Educated Patient    Methods Explanation;Demonstration;Handout    Comprehension Returned demonstration;Verbalized understanding               PT Long Term Goals - 01/08/20 2118      PT LONG TERM GOAL #1   Title The patient will be independent with HEP for LE strengthening, balance, neural gliding (UE/LE), and general mobility.    Time 6    Period Weeks    Target Date 02/19/20      PT LONG TERM GOAL #2   Title The patient will reduce functional limitation per FOTO from 40% limitation to < or equal to 30% limitation.    Time 6    Period Weeks    Target Date 02/19/20      PT LONG TERM GOAL #3   Title The patient will demonstrate single limb  stance x 10 seconds bilaterally to demo improved balance control for high level tasks.    Time 6    Period Weeks    Target Date 02/19/20      PT LONG TERM GOAL #4   Title The patient will report return to unlevel surface negotiation for hiking x 1-2 miles.    Time 6    Period Weeks    Target Date 02/19/20      PT LONG TERM GOAL #5   Title The patient will be further assessed on R hand fine motor skills and goal to follow.    Time 6    Period Weeks    Target Date 02/19/20                 Plan - 01/10/20 1310    Clinical Impression Statement The patient tolerated activities well with some neural tension with glides.  She was positive for [place] sign R hand.  PT to continue to progress balance and R UE activities to tolerance.    Examination-Participation Restrictions --   dec'd ability to perform hobbies   Rehab Potential Good    PT Frequency 2x / week    PT Duration 6 weeks    PT Treatment/Interventions ADLs/Self Care Home Management;Gait training;Stair training;Functional mobility training;Therapeutic activities;Therapeutic exercise;Taping;Patient/family education;Manual techniques;Neuromuscular re-education;Balance training    PT Next Visit Plan neural gliding, and establish HEP for fine motor tasks; high level balance; multi-sensory balance training.    PT Home Exercise Plan Access Code: [email or web address]    [email or web address] and Agree with Plan of Care Patient           Patient will benefit from skilled therapeutic intervention in order to improve the following deficits and impairments:     Visit Diagnosis: Other abnormalities of gait and mobility  Other symptoms and signs involving the nervous system     Problem List Patient Active Problem List   Diagnosis Date Noted  . Anemia due to antineoplastic chemotherapy 10/11/2019  . Atrophic vaginitis 08/17/2019  . Genetic testing 08/14/2019  . Peripheral neuropathy due to and not concurrent with chemotherapy ([place]) 05/21/2019  .  Family history of stomach cancer   . Family history of lung cancer   . GERD (gastroesophageal reflux disease)  05/01/2019  . Reactive thrombocytosis 04/20/2019  . Leukocytosis 04/20/2019  . Cancer associated pain 04/20/2019  . Ovarian cancer on right ([place]) 04/18/2019  . Hyperparathyroidism ([place]) 10/22/2014  . Extrinsic asthma 09/26/2014  . Fibromyalgia 09/26/2014  . Poor concentration 08/08/2014  . Family history of colon cancer 08/08/2014  . Insomnia 08/08/2014  . Prediabetes 08/08/2014  . Essential hypertension 07/19/2014  . Hyperlipidemia 07/19/2014  . Hypercalcemia 07/19/2014  . Depression 07/19/2014  . ABNORMAL EKG 12/04/2008    Kayveon Lennartz , PT 01/10/2020, 1:12 PM  [facility] [place], [place], [address] Phone: [phone]   Fax:  [phone]  Name: NAVAYAH SOK MRN: [id number] Date of Birth: [birth year]

## 2020-01-14 DIAGNOSIS — H31093 Other chorioretinal scars, bilateral: Secondary | ICD-10-CM | POA: Diagnosis not present

## 2020-01-14 DIAGNOSIS — H2513 Age-related nuclear cataract, bilateral: Secondary | ICD-10-CM | POA: Diagnosis not present

## 2020-01-16 ENCOUNTER — Other Ambulatory Visit: Payer: Self-pay

## 2020-01-16 ENCOUNTER — Ambulatory Visit (INDEPENDENT_AMBULATORY_CARE_PROVIDER_SITE_OTHER): Payer: Medicare HMO | Admitting: Rehabilitative and Restorative Service Providers"

## 2020-01-16 ENCOUNTER — Encounter: Payer: Self-pay | Admitting: Rehabilitative and Restorative Service Providers"

## 2020-01-16 DIAGNOSIS — R2689 Other abnormalities of gait and mobility: Secondary | ICD-10-CM

## 2020-01-16 DIAGNOSIS — R29818 Other symptoms and signs involving the nervous system: Secondary | ICD-10-CM | POA: Diagnosis not present

## 2020-01-16 NOTE — Patient Instructions (Signed)
Access Code: [email or web address] URL: [email or web address] Date: 01/16/2020 Prepared by: Rudell Cobb  Program Notes For arm and upper body stretches, numbness should reduce within 15 minutes of exercise.   Exercises Tandem Stance with Support - 2 x daily - 7 x weekly - 1 sets - 3 reps - 30 seconds hold Single Leg Stance with Support - 2 x daily - 7 x weekly - 1 sets - 3 reps - 10-15 seconds hold Squat with Chair Touch - 2 x daily - 7 x weekly - 1 sets - 10 reps Sideways Walking - 2 x daily - 7 x weekly - 1 sets - 10 reps Romberg Stance on Foam Pad with Head Rotation - 2 x daily - 7 x weekly - 1 sets - 10 reps Wide Stance with Eyes Closed on Foam Pad - 2 x daily - 7 x weekly - 1 sets - 3 reps - 30 seconds hold Standing Pec Stretch at Wall - 2 x daily - 7 x weekly - 1 sets - 3 reps - 20 seconds hold Median nerve abduction tensioners - 2 x daily - 7 x weekly - 1 sets - 10 reps Standing Gaze Stabilization with Head Rotation - 2 x daily - 7 x weekly - 1 sets - 1 reps - 30 seconds hold

## 2020-01-16 NOTE — Therapy (Signed)
[place], [place], [address] Phone: [phone]   Fax:  [phone]  Physical Therapy Treatment  Patient Details  Name: Connie Heath MRN: [id number] Date of Birth: 26-May-1950 Referring Provider (PT): Everitt Amber, MD   Encounter Date: 01/16/2020   PT End of Session - 01/16/20 1029    Visit Number 3    Number of Visits 12    Date for PT Re-Evaluation 02/19/20    Authorization Type humana medicare-  awaiting authorization    Authorization - Visit Number 3    Authorization - Number of Visits 12    PT Start Time 1025    PT Stop Time 1105    PT Time Calculation (min) 40 min    Activity Tolerance Patient tolerated treatment well    Behavior During Therapy [facility] for tasks assessed/performed           Past Medical History:  Diagnosis Date  . #[id number] dx'd 03/2019   ovarian cancer   . Depression   . Dyspnea    with increased exertion   . Family history of lung cancer   . Family history of stomach cancer   . Fatigue   . GERD (gastroesophageal reflux disease)   . Hyperlipidemia    H/o myalgias with Vytorin but tolerating simvastatin  . Hypertension   . Myalgia    Hx of   . S/P abdominal paracentesis 04/25/2019   pulled off 2.5 liters  . S/P cholecystectomy   . S/P hysterectomy   . Shingles     Past Surgical History:  Procedure Laterality Date  . DIAGNOSTIC LAPAROSCOPY    . IR IMAGING GUIDED PORT INSERTION  05/17/2019  . JOINT REPLACEMENT     right total hip   . LAPAROSCOPIC CHOLECYSTECTOMY    . PARACENTESIS    . PARATHYROIDECTOMY Right 2013  . PARTIAL HYSTERECTOMY    . TONSILLECTOMY AND ADENOIDECTOMY      There were no vitals filed for this visit.   Subjective Assessment - 01/16/20 1029    Subjective The patient is working on balance exercises frequently.  She is noticing continued numbness in the right hand.    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      Neuro Re-ed    Neuro Re-ed Details  compliant surface standing with lateral step downs/step ups, alternating foot taps to cones, BOSU mini squats and alternating UE reaching.      Exercises   Exercises Knee/Hip;Other Exercises    Other Exercises  reviewed neural gliding median nerve;  overhead shoulder press x 10 reps and bicep curls x 10 reps      Knee/Hip Exercises: Standing   Lateral Step Up 10 reps;Right;Left    Lateral Step  Up Limitations onto compliant surface for balance and strengthening challenge    Step Down 10 reps    Step Down Limitations compliant surfaces    Functional Squat 10 reps    Functional Squat Limitations chair touch for technique    SLS right and left sides working on ankle and hip control    Gait Training large stride backwards walking x 20 feet x 4 reps with ball overhead    Other Standing Knee Exercises sidestepping squats x 10 reps                  PT Education - 01/16/20 1219    Education Details HEP progression    Person(s) Educated Patient    Methods Explanation;Demonstration;Handout    Comprehension Verbalized understanding;Returned demonstration               PT Long Term Goals - 01/08/20 2118      PT LONG TERM GOAL #1   Title The patient will be independent with HEP for LE strengthening, balance, neural gliding (UE/LE), and general mobility.    Time 6    Period Weeks     Target Date 02/19/20      PT LONG TERM GOAL #2   Title The patient will reduce functional limitation per FOTO from 40% limitation to < or equal to 30% limitation.    Time 6    Period Weeks    Target Date 02/19/20      PT LONG TERM GOAL #3   Title The patient will demonstrate single limb stance x 10 seconds bilaterally to demo improved balance control for high level tasks.    Time 6    Period Weeks    Target Date 02/19/20      PT LONG TERM GOAL #4   Title The patient will report return to unlevel surface negotiation for hiking x 1-2 miles.    Time 6    Period Weeks    Target Date 02/19/20      PT LONG TERM GOAL #5   Title The patient will be further assessed on R hand fine motor skills and goal to follow.    Time 6    Period Weeks    Target Date 02/19/20                 Plan - 01/16/20 1229    Clinical Impression Statement The patient is tolerating balance activities well.  She continues with numbness in the R hand, however is working through use of R hand for daily tasks.  PT to progress to tolerance.    Examination-Participation Restrictions --   dec'd ability to perform hobbies   Rehab Potential Good    PT Frequency 2x / week    PT Duration 6 weeks    PT Treatment/Interventions ADLs/Self Care Home Management;Gait training;Stair training;Functional mobility training;Therapeutic activities;Therapeutic exercise;Taping;Patient/family education;Manual techniques;Neuromuscular re-education;Balance training    PT Next Visit Plan neural gliding, and establish HEP for fine motor tasks; high level balance; multi-sensory balance training.    PT Home Exercise Plan Access Code: [email or web address]    [email or web address] and Agree with Plan of Care Patient           Patient will benefit from skilled therapeutic intervention in order to improve the following deficits and impairments:  Abnormal gait, Decreased balance, Decreased activity tolerance, Impaired sensation  Visit Diagnosis: Other  abnormalities of gait and mobility  Other symptoms and signs involving the nervous system     Problem List  Patient Active Problem List   Diagnosis Date Noted  . Anemia due to antineoplastic chemotherapy 10/11/2019  . Atrophic vaginitis 08/17/2019  . Genetic testing 08/14/2019  . Peripheral neuropathy due to and not concurrent with chemotherapy ([place]) 05/21/2019  . Family history of stomach cancer   . Family history of lung cancer   . GERD (gastroesophageal reflux disease) 05/01/2019  . Reactive thrombocytosis 04/20/2019  . Leukocytosis 04/20/2019  . Cancer associated pain 04/20/2019  . Ovarian cancer on right ([place]) 04/18/2019  . Hyperparathyroidism ([place]) 10/22/2014  . Extrinsic asthma 09/26/2014  . Fibromyalgia 09/26/2014  . Poor concentration 08/08/2014  . Family history of colon cancer 08/08/2014  . Insomnia 08/08/2014  . Prediabetes 08/08/2014  . Essential hypertension 07/19/2014  . Hyperlipidemia 07/19/2014  . Hypercalcemia 07/19/2014  . Depression 07/19/2014  . ABNORMAL EKG 12/04/2008    Annaleigha Woo, PT 01/16/2020, 12:30 PM  [facility] [place], [place], [address] Phone: [phone]   Fax:  [phone]  Name: Connie Heath MRN: [id number] Date of Birth: 24-Aug-1950

## 2020-01-18 ENCOUNTER — Other Ambulatory Visit: Payer: Self-pay

## 2020-01-18 ENCOUNTER — Ambulatory Visit (INDEPENDENT_AMBULATORY_CARE_PROVIDER_SITE_OTHER): Payer: Medicare HMO | Admitting: Physical Therapy

## 2020-01-18 DIAGNOSIS — R29818 Other symptoms and signs involving the nervous system: Secondary | ICD-10-CM | POA: Diagnosis not present

## 2020-01-18 DIAGNOSIS — R2689 Other abnormalities of gait and mobility: Secondary | ICD-10-CM | POA: Diagnosis not present

## 2020-01-18 NOTE — Therapy (Signed)
[place], [place], [address] Phone: [phone]   Fax:  [phone]  Physical Therapy Treatment  Patient Details  Name: Connie Heath MRN: [id number] Date of Birth: 1950-10-01 Referring Provider (PT): Everitt Amber, MD   Encounter Date: 01/18/2020   PT End of Session - 01/18/20 1024    Visit Number 4    Number of Visits 12    Date for PT Re-Evaluation 02/19/20    Authorization Type humana medicare-  awaiting authorization    Authorization - Visit Number 4    Authorization - Number of Visits 12    PT Start Time 1443    PT Stop Time 1540    PT Time Calculation (min) 36 min           Past Medical History:  Diagnosis Date  . #[id number] dx'd 03/2019   ovarian cancer   . Depression   . Dyspnea    with increased exertion   . Family history of lung cancer   . Family history of stomach cancer   . Fatigue   . GERD (gastroesophageal reflux disease)   . Hyperlipidemia    H/o myalgias with Vytorin but tolerating simvastatin  . Hypertension   . Myalgia    Hx of   . S/P abdominal paracentesis 04/25/2019   pulled off 2.5 liters  . S/P cholecystectomy   . S/P hysterectomy   . Shingles     Past Surgical History:  Procedure Laterality Date  . DIAGNOSTIC LAPAROSCOPY    . IR IMAGING GUIDED PORT INSERTION  05/17/2019  . JOINT REPLACEMENT     right total hip   . LAPAROSCOPIC CHOLECYSTECTOMY    . PARACENTESIS    . PARATHYROIDECTOMY Right 2013  . PARTIAL HYSTERECTOMY    . TONSILLECTOMY AND ADENOIDECTOMY      There were no vitals filed for this visit.   Subjective Assessment - 01/18/20 1023    Subjective "Balance is getting better, Lt hand is less numb. Right hand is about the same".    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 No/denies              [facility] PT Assessment - 01/18/20 0001      Assessment   Medical Diagnosis periheral neuropathy s/p chemotherapy, imbalance    Referring Provider (PT) Everitt Amber, MD    Onset Date/Surgical Date 09/10/19   last chemo treatment   Hand Dominance Right            [facility] Adult PT Treatment/Exercise - 01/18/20 0001      Knee/Hip Exercises: Stretches   Quad Stretch Right;Left;30 seconds;2 reps    Sports administrator Limitations standing x 1, prone x 1          Knee/Hip Exercises: Standing   Heel Raises Left;1 set;5 reps   foot off edge of step; trial   Other Standing Knee Exercises side step with squat to touch buttocks to low black mat x 7 ft Rt/ 7 ft Lt       Shoulder Exercises: Stretch   Other  Shoulder Stretches pec stretch per HEP x 1 rep each side.       Ankle Exercises: Supine   Isometrics Lt ankle eversion isometric x 5 sec x 5 reps     T-Band red band with Rt ankle eversion in long sitting (challenging- switched to isometrics)      Ankle Exercises: Seated   Other Seated Ankle Exercises Rt eversion with red band x 10 reps (challenging, VC/tactile cues for form)            Balance Exercises - 01/18/20 0001      Balance Exercises: Standing   Standing Eyes Closed Narrow base of support (BOS);Foam/compliant surface;2 reps;20 secs   standing on mini tramp   Tandem Stance Eyes open;Foam/compliant surface;2 reps;25 secs   mini trampoline, 2 reps with horiz / vertical head turns   SLS Eyes open;2 reps;Solid surface;Intermittent upper extremity support;30 secs;Foam/compliant surface;4 reps    Stepping Strategy Anterior;Foam/compliant surface;5 reps;Limitations   onto bosu, gray therapad   Stepping Strategy Limitations Lt ankle gives way and inverts; unable to tolerate either of these  surfaces.     Rockerboard Anterior/posterior;EO;30 seconds (1/2 foam roll with flat side up, intermittent UE to steady)   Tandem Gait Forward;Retro;Intermittent upper extremity support;3 reps                  PT Long Term Goals - 01/08/20 2118      PT LONG TERM GOAL #1   Title The patient will be independent with HEP for LE strengthening, balance, neural gliding (UE/LE), and general mobility.    Time 6    Period Weeks    Target Date 02/19/20      PT LONG TERM GOAL #2   Title The patient will reduce functional limitation per FOTO from 40% limitation to < or equal to 30% limitation.    Time 6    Period Weeks    Target Date 02/19/20      PT LONG TERM GOAL #3   Title The patient will demonstrate single limb stance x 10 seconds bilaterally to demo improved balance control for high level tasks.    Time 6    Period Weeks    Target Date 02/19/20      PT LONG TERM GOAL #4   Title The patient will report return to unlevel surface negotiation for hiking x 1-2 miles.    Time 6    Period Weeks    Target Date 02/19/20      PT LONG TERM GOAL #5   Title The patient will be further assessed on R hand fine motor skills and goal to follow.    Time 6    Period Weeks    Target Date 02/19/20                 Plan - 01/18/20 1100    Clinical Impression Statement Decreased strength in Lt ankle fibularis muscles; unable to tolerate step ups onto bosu or gray therapad due to ankle rolling each rep. Added ankle eversion exercise and quad stretch to HEP.  All other exercises tolerated well with no overt loss of balance.    Examination-Participation Restrictions --   dec'd ability to perform hobbies   Rehab Potential Good    PT Frequency 2x / week    PT Duration 6 weeks    PT Treatment/Interventions ADLs/Self Care Home Management;Gait training;Stair training;Functional mobility training;Therapeutic activities;Therapeutic exercise;Taping;Patient/family education;Manual  techniques;Neuromuscular re-education;Balance training    PT Next Visit Plan neural  gliding, and establish HEP for fine motor tasks; high level balance; multi-sensory balance training.    PT Home Exercise Plan Access Code: [email or web address]    [email or web address] and Agree with Plan of Care Patient           Patient will benefit from skilled therapeutic intervention in order to improve the following deficits and impairments:  Abnormal gait, Decreased balance, Decreased activity tolerance, Impaired sensation  Visit Diagnosis: Other abnormalities of gait and mobility  Other symptoms and signs involving the nervous system     Problem List Patient Active Problem List   Diagnosis Date Noted  . Anemia due to antineoplastic chemotherapy 10/11/2019  . Atrophic vaginitis 08/17/2019  . Genetic testing 08/14/2019  . Peripheral neuropathy due to and not concurrent with chemotherapy ([place]) 05/21/2019  . Family history of stomach cancer   . Family history of lung cancer   . GERD (gastroesophageal reflux disease) 05/01/2019  . Reactive thrombocytosis 04/20/2019  . Leukocytosis 04/20/2019  . Cancer associated pain 04/20/2019  . Ovarian cancer on right ([place]) 04/18/2019  . Hyperparathyroidism ([place]) 10/22/2014  . Extrinsic asthma 09/26/2014  . Fibromyalgia 09/26/2014  . Poor concentration 08/08/2014  . Family history of colon cancer 08/08/2014  . Insomnia 08/08/2014  . Prediabetes 08/08/2014  . Essential hypertension 07/19/2014  . Hyperlipidemia 07/19/2014  . Hypercalcemia 07/19/2014  . Depression 07/19/2014  . ABNORMAL EKG 12/04/2008   Kerin Perna, PTA 01/18/20 4:34 PM  [place], [place], [address] Phone: [phone]   Fax:  [phone]  Name: Connie Heath MRN: [id number] Date of Birth: Feb 11, 1951

## 2020-01-25 ENCOUNTER — Other Ambulatory Visit: Payer: Self-pay

## 2020-01-25 ENCOUNTER — Encounter: Payer: Self-pay | Admitting: Rehabilitative and Restorative Service Providers"

## 2020-01-25 ENCOUNTER — Ambulatory Visit (INDEPENDENT_AMBULATORY_CARE_PROVIDER_SITE_OTHER): Payer: Medicare HMO | Admitting: Rehabilitative and Restorative Service Providers"

## 2020-01-25 DIAGNOSIS — R29818 Other symptoms and signs involving the nervous system: Secondary | ICD-10-CM | POA: Diagnosis not present

## 2020-01-25 DIAGNOSIS — R2689 Other abnormalities of gait and mobility: Secondary | ICD-10-CM

## 2020-01-25 NOTE — Therapy (Signed)
[place], [place], [address] Phone: [phone]   Fax:  [phone]  Physical Therapy Treatment  Patient Details  Name: Connie Heath MRN: [id number] Date of Birth: Apr 01, 1951 Referring Provider (PT): Everitt Amber, MD   Encounter Date: 01/25/2020   PT End of Session - 01/25/20 1021    Visit Number 5    Number of Visits 12    Date for PT Re-Evaluation 02/19/20    Authorization Type humana medicare-  awaiting authorization    Authorization - Visit Number 5    Authorization - Number of Visits 12    PT Start Time 1017    PT Stop Time 1100    PT Time Calculation (min) 43 min    Activity Tolerance Patient tolerated treatment well    Behavior During Therapy [facility] for tasks assessed/performed           Past Medical History:  Diagnosis Date  . #[id number] dx'd 03/2019   ovarian cancer   . Depression   . Dyspnea    with increased exertion   . Family history of lung cancer   . Family history of stomach cancer   . Fatigue   . GERD (gastroesophageal reflux disease)   . Hyperlipidemia    H/o myalgias with Vytorin but tolerating simvastatin  . Hypertension   . Myalgia    Hx of   . S/P abdominal paracentesis 04/25/2019   pulled off 2.5 liters  . S/P cholecystectomy   . S/P hysterectomy   . Shingles     Past Surgical History:  Procedure Laterality Date  . DIAGNOSTIC LAPAROSCOPY    . IR IMAGING GUIDED PORT INSERTION  05/17/2019  . JOINT REPLACEMENT     right total hip   . LAPAROSCOPIC CHOLECYSTECTOMY    . PARACENTESIS    . PARATHYROIDECTOMY Right 2013  . PARTIAL HYSTERECTOMY    . TONSILLECTOMY AND ADENOIDECTOMY      There were no vitals filed for this visit.   Subjective Assessment - 01/25/20 1018    Subjective The patient reports she is having trouble isolating her L ankle during ther ex.  The patient notes less numbness and more tingling in the R hand.    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 No/denies                             [facility] Adult PT Treatment/Exercise - 01/25/20 1022      Neuro Re-ed    Neuro Re-ed Details  Compliant surface standing working on single limb balance and motor control with occasional UE support due to the L ankle instability; tandem stance, reviewed standing gaze adaptation x 30 seconds with feet apart and feet together.        Exercises   Exercises Ankle;Knee/Hip      Knee/Hip Exercises: Standing   Other Standing Knee Exercises sidestep lunges with lateral weight shifting      Ankle Exercises: Stretches   Other Stretch great toe flexion stretch (due to observation of rolling  into eversion off of ball of foot in standing with heel raise)    Other Stretch seated toe flexion/extension, standing ant tib stretch with toe flexion      Ankle Exercises: Standing   Vector Stance Left;Right;5 reps    Vector Stance Limitations with isometric holds returning to single leg stance for control + balance    SLS single leg stance L side dec'ing UE support, also performed R side    Heel Raises Right;Left;10 reps;2 seconds    Other Standing Ankle Exercises wide/ wide/narrow/narrow through agility ladder working on coordination and increasing speed of movement with feet x 20 reps                       PT Long Term Goals - 01/08/20 2118      PT LONG TERM GOAL #1   Title The patient will be independent with HEP for LE strengthening, balance, neural gliding (UE/LE), and general mobility.    Time 6    Period Weeks    Target Date 02/19/20      PT LONG TERM GOAL #2   Title The patient  will reduce functional limitation per FOTO from 40% limitation to < or equal to 30% limitation.    Time 6    Period Weeks    Target Date 02/19/20      PT LONG TERM GOAL #3   Title The patient will demonstrate single limb stance x 10 seconds bilaterally to demo improved balance control for high level tasks.    Time 6    Period Weeks    Target Date 02/19/20      PT LONG TERM GOAL #4   Title The patient will report return to unlevel surface negotiation for hiking x 1-2 miles.    Time 6    Period Weeks    Target Date 02/19/20      PT LONG TERM GOAL #5   Title The patient will be further assessed on R hand fine motor skills and goal to follow.    Time 6    Period Weeks    Target Date 02/19/20                 Plan - 01/25/20 1242    Clinical Impression Statement The patient has great toe ROM limitations that may be limiting her heel raise (rolls into inversion).  She is improving iwth overall strength/conditioning and balance.  She also notes dec'd R hand numbness.  PT to continue working ot [place].    Examination-Participation Restrictions --   dec'd ability to perform hobbies   Rehab Potential Good    PT Frequency 2x / week    PT Duration 6 weeks    PT Treatment/Interventions ADLs/Self Care Home Management;Gait training;Stair training;Functional mobility training;Therapeutic activities;Therapeutic exercise;Taping;Patient/family education;Manual techniques;Neuromuscular re-education;Balance training    PT Next Visit Plan general conditioning, progress UE ther ex for HEP if needed, L ankle strengthening, high level balance; multi-sensory balance training.    PT Home Exercise Plan Access Code: [email or web address]    [email or web address] and Agree with Plan of Care Patient           Patient will benefit from skilled therapeutic intervention in order to improve the following deficits and impairments:  Abnormal gait, Decreased balance, Decreased activity tolerance, Impaired sensation  Visit  Diagnosis: Other abnormalities of gait and mobility  Other symptoms and signs involving the nervous system     Problem List Patient Active Problem List  Diagnosis Date Noted  . Anemia due to antineoplastic chemotherapy 10/11/2019  . Atrophic vaginitis 08/17/2019  . Genetic testing 08/14/2019  . Peripheral neuropathy due to and not concurrent with chemotherapy ([place]) 05/21/2019  . Family history of stomach cancer   . Family history of lung cancer   . GERD (gastroesophageal reflux disease) 05/01/2019  . Reactive thrombocytosis 04/20/2019  . Leukocytosis 04/20/2019  . Cancer associated pain 04/20/2019  . Ovarian cancer on right ([place]) 04/18/2019  . Hyperparathyroidism ([place]) 10/22/2014  . Extrinsic asthma 09/26/2014  . Fibromyalgia 09/26/2014  . Poor concentration 08/08/2014  . Family history of colon cancer 08/08/2014  . Insomnia 08/08/2014  . Prediabetes 08/08/2014  . Essential hypertension 07/19/2014  . Hyperlipidemia 07/19/2014  . Hypercalcemia 07/19/2014  . Depression 07/19/2014  . ABNORMAL EKG 12/04/2008    Sollie Vultaggio, PT 01/25/2020, 1:04 PM  [facility] [place], [place], [address] Phone: [phone]   Fax:  [phone]  Name: JEARLENE BRIDWELL MRN: [id number] Date of Birth: Jan 04, 1951

## 2020-01-31 DIAGNOSIS — Z23 Encounter for immunization: Secondary | ICD-10-CM | POA: Diagnosis not present

## 2020-02-01 ENCOUNTER — Other Ambulatory Visit: Payer: Self-pay

## 2020-02-01 ENCOUNTER — Encounter: Payer: Self-pay | Admitting: Physical Therapy

## 2020-02-01 ENCOUNTER — Ambulatory Visit (INDEPENDENT_AMBULATORY_CARE_PROVIDER_SITE_OTHER): Payer: Medicare HMO | Admitting: Physical Therapy

## 2020-02-01 DIAGNOSIS — R2689 Other abnormalities of gait and mobility: Secondary | ICD-10-CM

## 2020-02-01 DIAGNOSIS — R29818 Other symptoms and signs involving the nervous system: Secondary | ICD-10-CM | POA: Diagnosis not present

## 2020-02-01 NOTE — Therapy (Signed)
[place], [place], [address] Phone: [phone]   Fax:  [phone]  Physical Therapy Treatment  Patient Details  Name: Connie Heath MRN: [id number] Date of Birth: 1950-07-28 Referring Provider (PT): Everitt Amber, MD   Encounter Date: 02/01/2020   PT End of Session - 02/01/20 1056    Visit Number 6    Number of Visits 12    Date for PT Re-Evaluation 02/19/20    Authorization Type human[employer] medicare-  awaiting authorization    Authorization - Visit Number 6    Authorization - Number of Visits 12    PT Start Time 1017    PT Stop Time 1051    PT Time Calculation (min) 34 min    Activity Tolerance Patient tolerated treatment well    Behavior During Therapy [facility] for tasks assessed/performed           Past Medical History:  Diagnosis Date  . #[id number] dx'd 03/2019   ovarian cancer   . Depression   . Dyspnea    with increased exertion   . Family history of lung cancer   . Family history of stomach cancer   . Fatigue   . GERD (gastroesophageal reflux disease)   . Hyperlipidemia    H/o myalgias with Vytorin but tolerating simvastatin  . Hypertension   . Myalgia    Hx of   . S/P abdominal paracentesis 04/25/2019   pulled off 2.5 liters  . S/P cholecystectomy   . S/P hysterectomy   . Shingles     Past Surgical History:  Procedure Laterality Date  . DIAGNOSTIC LAPAROSCOPY    . IR IMAGING GUIDED PORT INSERTION  05/17/2019  . JOINT REPLACEMENT     right total hip   . LAPAROSCOPIC CHOLECYSTECTOMY    . PARACENTESIS    . PARATHYROIDECTOMY Right 2013  . PARTIAL HYSTERECTOMY    . TONSILLECTOMY AND ADENOIDECTOMY      There were no vitals filed for this visit.   Subjective Assessment - 02/01/20 1018    Subjective Pt reports she may have overdone it with her exercises, as her Lt ankle is "killing her".  She feels her balance is improving.  She is able to get pants on while balancing on Lt  ankle.    Patient Stated Goals improve fine motor skills R hand (or figure out if it is carpal tunnel), would like to imrpove steadiness so I can walk int he sand or in my yard    Currently in Pain? Yes    Pain Score 5     Pain Location Ankle    Pain Orientation Left    Pain Descriptors / Indicators Aching;Dull    Aggravating Factors  overuse    Pain Relieving Factors nothing              [facility] PT Assessment - 02/01/20 0001      Assessment   Medical Diagnosis periheral neuropathy s/p chemotherapy, imbalance    Referring Provider (PT) Everitt Amber, MD    Onset Date/Surgical Date 09/10/19   last chemo treatment   Hand Dominance Right           [facility] Adult PT Treatment/Exercise - 02/01/20 0001      Self-Care   Self-Care Other Self-Care Comments    Other Self-Care Comments  reviewed self massage with stick roller to ant tib of LLE; pt returned demo with cues.       Shoulder Exercises: Stretch  Other Shoulder Stretches verbally reviewed UE stretch for nerve glides/ pec stretch; pt returned demo of each for 3 sec.       Manual Therapy   Manual Therapy Soft tissue mobilization;Taping    Manual therapy comments I strip of sensitive skin tape applied to Lt dorsum of foot at MTP and extending up to ant tib at tibial plateau with 20% stretch; perpendicular pieces applied at dorsum of foot and just above lateral malleoli to decompress tissue, increase proprioception and decrease pain.     Soft tissue mobilization IASTM, STM, and TPR to Lt extensor digitorum longus and ant tib to decrease fascial restrcitions and improve mobility.       Ankle Exercises: Stretches   Other Stretch great toe flexion stretch (due to observation of rolling into eversion off of ball of foot in standing with heel raise)    Other Stretch seated toe flexion/extension, seated Lt ant tib stretch with toe flexion      Ankle Exercises: Seated   Heel Raises Both;10 reps    Toe Raise 10 reps    Other Seated Ankle  Exercises Lt anker eversion with red band x 8 reps       Ankle Exercises: Standing   SLS Lt SLS x 3 reps of 10 sec     Heel Raises Both;10 reps    Toe Raise 10 reps                  PT Education - 02/01/20 1103    Education Details ktape info    Person(s) Educated Patient    Methods Explanation    Comprehension Verbalized understanding               PT Long Term Goals - 01/08/20 2118      PT LONG TERM GOAL #1   Title The patient will be independent with HEP for LE strengthening, balance, neural gliding (UE/LE), and general mobility.    Time 6    Period Weeks    Target Date 02/19/20      PT LONG TERM GOAL #2   Title The patient will reduce functional limitation per FOTO from 40% limitation to < or equal to 30% limitation.    Time 6    Period Weeks    Target Date 02/19/20      PT LONG TERM GOAL #3   Title The patient will demonstrate single limb stance x 10 seconds bilaterally to demo improved balance control for high level tasks.    Time 6    Period Weeks    Target Date 02/19/20      PT LONG TERM GOAL #4   Title The patient will report return to unlevel surface negotiation for hiking x 1-2 miles.    Time 6    Period Weeks    Target Date 02/19/20      PT LONG TERM GOAL #5   Title The patient will be further assessed on R hand fine motor skills and goal to follow.    Time 6    Period Weeks    Target Date 02/19/20                 Plan - 02/01/20 1056    Clinical Impression Statement Palpable tightness/ tenderness in Lt extensor digitorum longus and ant tib; improved with IASTM and TPR.  Pt reported elimination of pain in ant Lt ankle after manual therapy and ktape application.  Pt able to balance with greater ease after  tape application. Encouraged pt to reduced ankle resistance to green or red (instead of blue) every other day to reduce irritation.  Pt progressing well towards LTGs.    Examination-Participation Restrictions --   dec'd ability to  perform hobbies   Rehab Potential Good    PT Frequency 2x / week    PT Duration 6 weeks    PT Treatment/Interventions ADLs/Self Care Home Management;Gait training;Stair training;Functional mobility training;Therapeutic activities;Therapeutic exercise;Taping;Patient/family education;Manual techniques;Neuromuscular re-education;Balance training    PT Next Visit Plan general conditioning, progress UE ther ex for HEP if needed, L ankle strengthening, high level balance; multi-sensory balance training.  Assess response to tape; reapply if needed.    PT Home Exercise Plan Access Code: [email or web address]    [email or web address] and Agree with Plan of Care Patient           Patient will benefit from skilled therapeutic intervention in order to improve the following deficits and impairments:  Abnormal gait, Decreased balance, Decreased activity tolerance, Impaired sensation  Visit Diagnosis: Other abnormalities of gait and mobility  Other symptoms and signs involving the nervous system     Problem List Patient Active Problem List   Diagnosis Date Noted  . Anemia due to antineoplastic chemotherapy 10/11/2019  . Atrophic vaginitis 08/17/2019  . Genetic testing 08/14/2019  . Peripheral neuropathy due to and not concurrent with chemotherapy ([place]) 05/21/2019  . Family history of stomach cancer   . Family history of lung cancer   . GERD (gastroesophageal reflux disease) 05/01/2019  . Reactive thrombocytosis 04/20/2019  . Leukocytosis 04/20/2019  . Cancer associated pain 04/20/2019  . Ovarian cancer on right ([place]) 04/18/2019  . Hyperparathyroidism ([place]) 10/22/2014  . Extrinsic asthma 09/26/2014  . Fibromyalgia 09/26/2014  . Poor concentration 08/08/2014  . Family history of colon cancer 08/08/2014  . Insomnia 08/08/2014  . Prediabetes 08/08/2014  . Essential hypertension 07/19/2014  . Hyperlipidemia 07/19/2014  . Hypercalcemia 07/19/2014  . Depression 07/19/2014  . ABNORMAL EKG 12/04/2008   Kerin Perna, PTA 02/01/20 6:03 PM  [place], [place], [address] Phone: [phone]   Fax:  [phone]  Name: Connie Heath MRN: [id number] Date of Birth: May 21, 1950

## 2020-02-01 NOTE — Patient Instructions (Signed)

## 2020-02-08 ENCOUNTER — Ambulatory Visit
Admission: RE | Admit: 2020-02-08 | Discharge: 2020-02-08 | Disposition: A | Discharge status: Home / Self Care | Payer: Medicare HMO | Source: Ambulatory Visit | Attending: Internal Medicine | Admitting: Internal Medicine

## 2020-02-08 ENCOUNTER — Other Ambulatory Visit: Payer: Self-pay

## 2020-02-08 ENCOUNTER — Encounter: Payer: Self-pay | Admitting: Rehabilitative and Restorative Service Providers"

## 2020-02-08 ENCOUNTER — Ambulatory Visit (INDEPENDENT_AMBULATORY_CARE_PROVIDER_SITE_OTHER): Payer: Medicare HMO | Admitting: Rehabilitative and Restorative Service Providers"

## 2020-02-08 DIAGNOSIS — R29818 Other symptoms and signs involving the nervous system: Secondary | ICD-10-CM

## 2020-02-08 DIAGNOSIS — Z780 Asymptomatic menopausal state: Secondary | ICD-10-CM | POA: Diagnosis not present

## 2020-02-08 DIAGNOSIS — Z13820 Encounter for screening for osteoporosis: Secondary | ICD-10-CM

## 2020-02-08 DIAGNOSIS — R2689 Other abnormalities of gait and mobility: Secondary | ICD-10-CM

## 2020-02-08 NOTE — Patient Instructions (Signed)
Access Code: [email or web address] URL: [email or web address] Date: 02/08/2020 Prepared by: Rudell Cobb  Program Notes *   Exercises Standing Single Leg Back and Marianne Sofia Rolling (BKA) - 2 x daily - 7 x weekly - 1 sets - 10 reps Leg Swing Single Leg Balance - 2 x daily - 7 x weekly - 1 sets - 10 reps Sidestepping - 2 x daily - 7 x weekly - 1 sets - 10 reps Standing Pec Stretch at Wall - 2 x daily - 7 x weekly - 1 sets - 3 reps - 20 seconds hold Seated Ankle Eversion with Resistance - 2 x daily - 7 x weekly - 1 sets - 10 reps Seated Toe Flexion Extension PROM - 2 x daily - 7 x weekly - 1 sets - 3 reps - 20 seconds hold Median nerve/carpal tunnel sliders - 2 x daily - 7 x weekly - 1 sets - 5 reps

## 2020-02-08 NOTE — Therapy (Signed)
[place], [place], [address] Phone: [phone]   Fax:  [phone]  Physical Therapy Treatment and Discharge Summary  Patient Details  Name: Connie Heath MRN: [id number] Date of Birth: [birth year] Referring Provider (PT): Everitt Amber, MD   Encounter Date: 02/08/2020   PT End of Session - 02/08/20 1107    Visit Number 7    Number of Visits 12    Date for PT Re-Evaluation 02/19/20    Authorization Type humana medicare-  awaiting authorization    Authorization - Visit Number 7    Authorization - Number of Visits 12    PT Start Time 1101    PT Stop Time 1583    PT Time Calculation (min) 44 min    Activity Tolerance Patient tolerated treatment well    Behavior During Therapy [facility] for tasks assessed/performed           Past Medical History:  Diagnosis Date   #[id number] dx'd 03/2019   ovarian cancer    Depression    Dyspnea    with increased exertion    Family history of lung cancer    Family history of stomach cancer    Fatigue    GERD (gastroesophageal reflux disease)    Hyperlipidemia    H/o myalgias with Vytorin but tolerating simvastatin   Hypertension    Myalgia    Hx of    S/P abdominal paracentesis 04/25/2019   pulled off 2.5 liters   S/P cholecystectomy    S/P hysterectomy    Shingles     Past Surgical History:  Procedure Laterality Date   DIAGNOSTIC LAPAROSCOPY     IR IMAGING GUIDED PORT INSERTION  05/17/2019   JOINT REPLACEMENT     right total hip    LAPAROSCOPIC CHOLECYSTECTOMY     PARACENTESIS     PARATHYROIDECTOMY Right 2013   PARTIAL HYSTERECTOMY     TONSILLECTOMY AND ADENOIDECTOMY      There were no vitals filed for this visit.   Subjective Assessment - 02/08/20 1104    Subjective The patient reports still some tenderness in the L ankle if she overdoes walking, and at the end of ther ex for ankle.  She has been sewing with a larger  needle due to dec'd sensation.  She also has been able to return to sewing with her machine.  "On the whole, what we have done has definitely helped."  The patient reports that she sees oncologist in November.  She is wondering if R hand is CTS or if it is neuropathy.  We discussed MD can assess for next steps to consider NCV or other referral.    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 No/denies              [facility] PT Assessment - 02/08/20 1241      Assessment   Medical Diagnosis periheral neuropathy s/p chemotherapy, imbalance    Referring Provider (PT) Everitt Amber, MD    Onset Date/Surgical Date 09/10/19  [place] Adult PT Treatment/Exercise - 02/08/20 1241      Self-Care   Self-Care Other Self-Care Comments    Other Self-Care Comments  discussed progression of HEP, how patient is functioning at this time and progression of activities post d/c.       Neuro Re-ed    Neuro Re-ed Details  single limb stance, discussed HEP for compliant surfaces and no longer challenging (removed for HEP)      Exercises   Exercises Ankle;Knee/Hip;Shoulder      Knee/Hip Exercises: Standing   SLS with Vectors SLS leg swings working on stabilizing t/o LE    Other Standing Knee Exercises sidestepping in mini squat x 10 reps R and L sides      Shoulder Exercises: Stretch   Other Shoulder Stretches Wall anterior pec stretch L and R continued for HEP    Other Shoulder Stretches For R wrist, also performed median nerve gliding progressing difficulty/intensity of exercise      Ankle Exercises: Stretches   Other Stretch great toe  flexion                  PT Education - 02/08/20 1240    Education Details modified HEP for final program    Person(s) Educated Patient    Methods Explanation;Demonstration;Handout    Comprehension Verbalized understanding;Returned demonstration               PT Long Term Goals - 02/08/20 1108      PT LONG TERM GOAL #1   Title The patient will be independent with HEP for LE strengthening, balance, neural gliding (UE/LE), and general mobility.    Time 6    Period Weeks    Status Achieved      PT LONG TERM GOAL #2   Title The patient will reduce functional limitation per FOTO from 40% limitation to < or equal to 30% limitation.    Baseline patient is 14% limited    Time 6    Period Weeks    Status Achieved      PT LONG TERM GOAL #3   Title The patient will demonstrate single limb stance x 10 seconds bilaterally to demo improved balance control for high level tasks.    Baseline *L leg takes multiple attempts.    Time 6    Period Weeks    Status Achieved      PT LONG TERM GOAL #4   Title The patient will report return to unlevel surface negotiation for hiking x 1-2 miles.    Baseline Patient is walking every morning 2.5 miles.  She negotiates unlevel surfaces in her yard all of the time and feels an improvement in complaint surface standing.    Time 6    Period Weeks    Status Achieved      PT LONG TERM GOAL #5   Title The patient will be further assessed on R hand fine motor skills and goal to follow.    Time 6    Period Weeks    Status Achieved                 Plan - 02/08/20 1244    Clinical Impression Statement The patient is meeting all LTGs.  She has HEP and is able to continue progressing through HEP.  Modified HEP to patient's tolerance.    Examination-Participation Restrictions --   dec'd ability to perform hobbies   Rehab Potential Good    PT Frequency 2x / week  PT Duration 6 weeks    PT Treatment/Interventions ADLs/Self Care Home  Management;Gait training;Stair training;Functional mobility training;Therapeutic activities;Therapeutic exercise;Taping;Patient/family education;Manual techniques;Neuromuscular re-education;Balance training    PT Next Visit Plan discharge today.    PT Home Exercise Plan Access Code: [email or web address]    [email or web address] and Agree with Plan of Care Patient           Patient will benefit from skilled therapeutic intervention in order to improve the following deficits and impairments:  Abnormal gait, Decreased balance, Decreased activity tolerance, Impaired sensation  Visit Diagnosis: Other abnormalities of gait and mobility  Other symptoms and signs involving the nervous system  PHYSICAL THERAPY DISCHARGE SUMMARY  Visits from Start of Care: 7  Current functional level related to goals / functional outcomes: See above   Remaining deficits: Patient is progressing with HEP a nd notes continues numbness R hand and some hypersensitivity.   Education / Equipment: HEP  Plan: Patient agrees to discharge.  Patient goals were met. Patient is being discharged due to meeting the stated rehab goals.  ?????        Problem List Patient Active Problem List   Diagnosis Date Noted   Anemia due to antineoplastic chemotherapy 10/11/2019   Atrophic vaginitis 08/17/2019   Genetic testing 08/14/2019   Peripheral neuropathy due to and not concurrent with chemotherapy ([place]) 05/21/2019   Family history of stomach cancer    Family history of lung cancer    GERD (gastroesophageal reflux disease) 05/01/2019   Reactive thrombocytosis 04/20/2019   Leukocytosis 04/20/2019   Cancer associated pain 04/20/2019   Ovarian cancer on right [facility]) 04/18/2019   Hyperparathyroidism ([place]) 10/22/2014   Extrinsic asthma 09/26/2014   Fibromyalgia 09/26/2014   Poor concentration 08/08/2014   Family history of colon cancer 08/08/2014   Insomnia 08/08/2014   Prediabetes 08/08/2014   Essential hypertension  07/19/2014   Hyperlipidemia 07/19/2014   Hypercalcemia 07/19/2014   Depression 07/19/2014   ABNORMAL EKG 12/04/2008    Kerrion Kemppainen, PT 02/08/2020, 12:45 PM  [facility] [place] [address] [place], [place], [address] Phone: [phone]   Fax:  [phone]  Name: Connie Heath MRN: [id number] Date of Birth: 11/12/1950

## 2020-02-14 ENCOUNTER — Inpatient Hospital Stay: Payer: Medicare HMO | Attending: Gynecologic Oncology

## 2020-02-14 ENCOUNTER — Other Ambulatory Visit: Payer: Self-pay

## 2020-02-14 DIAGNOSIS — C786 Secondary malignant neoplasm of retroperitoneum and peritoneum: Secondary | ICD-10-CM | POA: Insufficient documentation

## 2020-02-14 DIAGNOSIS — C561 Malignant neoplasm of right ovary: Secondary | ICD-10-CM

## 2020-02-14 DIAGNOSIS — Z452 Encounter for adjustment and management of vascular access device: Secondary | ICD-10-CM | POA: Diagnosis not present

## 2020-02-14 MED ORDER — SODIUM CHLORIDE 0.9% FLUSH
10.0000 mL | Freq: Once | INTRAVENOUS | Status: AC
  Administered 2020-02-14: 10 mL
  Filled 2020-02-14: qty 10

## 2020-02-14 MED ORDER — HEPARIN SOD (PORK) LOCK FLUSH 100 UNIT/ML IV SOLN
500.0000 [IU] | Freq: Once | INTRAVENOUS | Status: AC
  Administered 2020-02-14: 500 [IU]
  Filled 2020-02-14: qty 5

## 2020-02-15 ENCOUNTER — Encounter: Payer: Medicare HMO | Admitting: Physical Therapy

## 2020-02-18 ENCOUNTER — Inpatient Hospital Stay: Payer: Medicare HMO

## 2020-02-18 ENCOUNTER — Ambulatory Visit (HOSPITAL_COMMUNITY)
Admission: RE | Admit: 2020-02-18 | Discharge: 2020-02-18 | Disposition: A | Discharge status: Home / Self Care | Payer: Medicare HMO | Source: Ambulatory Visit | Attending: Hematology and Oncology | Admitting: Hematology and Oncology

## 2020-02-18 ENCOUNTER — Other Ambulatory Visit: Payer: Self-pay | Admitting: Hematology and Oncology

## 2020-02-18 ENCOUNTER — Other Ambulatory Visit: Payer: Self-pay

## 2020-02-18 ENCOUNTER — Telehealth: Payer: Self-pay | Admitting: Oncology

## 2020-02-18 ENCOUNTER — Inpatient Hospital Stay: Payer: Medicare HMO | Attending: Gynecologic Oncology

## 2020-02-18 DIAGNOSIS — K432 Incisional hernia without obstruction or gangrene: Secondary | ICD-10-CM | POA: Diagnosis not present

## 2020-02-18 DIAGNOSIS — J90 Pleural effusion, not elsewhere classified: Secondary | ICD-10-CM | POA: Diagnosis not present

## 2020-02-18 DIAGNOSIS — Z7901 Long term (current) use of anticoagulants: Secondary | ICD-10-CM | POA: Insufficient documentation

## 2020-02-18 DIAGNOSIS — C561 Malignant neoplasm of right ovary: Secondary | ICD-10-CM | POA: Diagnosis not present

## 2020-02-18 DIAGNOSIS — K5909 Other constipation: Secondary | ICD-10-CM | POA: Diagnosis not present

## 2020-02-18 DIAGNOSIS — G893 Neoplasm related pain (acute) (chronic): Secondary | ICD-10-CM | POA: Insufficient documentation

## 2020-02-18 DIAGNOSIS — R1032 Left lower quadrant pain: Secondary | ICD-10-CM | POA: Diagnosis not present

## 2020-02-18 DIAGNOSIS — K56609 Unspecified intestinal obstruction, unspecified as to partial versus complete obstruction: Secondary | ICD-10-CM | POA: Diagnosis not present

## 2020-02-18 DIAGNOSIS — K449 Diaphragmatic hernia without obstruction or gangrene: Secondary | ICD-10-CM | POA: Insufficient documentation

## 2020-02-18 DIAGNOSIS — R109 Unspecified abdominal pain: Secondary | ICD-10-CM | POA: Diagnosis not present

## 2020-02-18 DIAGNOSIS — Z5111 Encounter for antineoplastic chemotherapy: Secondary | ICD-10-CM | POA: Insufficient documentation

## 2020-02-18 DIAGNOSIS — Z9221 Personal history of antineoplastic chemotherapy: Secondary | ICD-10-CM | POA: Insufficient documentation

## 2020-02-18 DIAGNOSIS — R1084 Generalized abdominal pain: Secondary | ICD-10-CM

## 2020-02-18 DIAGNOSIS — R188 Other ascites: Secondary | ICD-10-CM | POA: Insufficient documentation

## 2020-02-18 DIAGNOSIS — K5730 Diverticulosis of large intestine without perforation or abscess without bleeding: Secondary | ICD-10-CM | POA: Insufficient documentation

## 2020-02-18 DIAGNOSIS — D6481 Anemia due to antineoplastic chemotherapy: Secondary | ICD-10-CM | POA: Insufficient documentation

## 2020-02-18 DIAGNOSIS — M8580 Other specified disorders of bone density and structure, unspecified site: Secondary | ICD-10-CM | POA: Insufficient documentation

## 2020-02-18 DIAGNOSIS — Z23 Encounter for immunization: Secondary | ICD-10-CM | POA: Diagnosis not present

## 2020-02-18 DIAGNOSIS — N390 Urinary tract infection, site not specified: Secondary | ICD-10-CM | POA: Insufficient documentation

## 2020-02-18 DIAGNOSIS — I700 Atherosclerosis of aorta: Secondary | ICD-10-CM | POA: Insufficient documentation

## 2020-02-18 DIAGNOSIS — Z79899 Other long term (current) drug therapy: Secondary | ICD-10-CM | POA: Diagnosis not present

## 2020-02-18 DIAGNOSIS — C786 Secondary malignant neoplasm of retroperitoneum and peritoneum: Secondary | ICD-10-CM | POA: Diagnosis not present

## 2020-02-18 DIAGNOSIS — Z888 Allergy status to other drugs, medicaments and biological substances status: Secondary | ICD-10-CM | POA: Insufficient documentation

## 2020-02-18 DIAGNOSIS — Z66 Do not resuscitate: Secondary | ICD-10-CM | POA: Insufficient documentation

## 2020-02-18 DIAGNOSIS — M47816 Spondylosis without myelopathy or radiculopathy, lumbar region: Secondary | ICD-10-CM | POA: Diagnosis not present

## 2020-02-18 DIAGNOSIS — N1330 Unspecified hydronephrosis: Secondary | ICD-10-CM | POA: Insufficient documentation

## 2020-02-18 DIAGNOSIS — Z885 Allergy status to narcotic agent status: Secondary | ICD-10-CM | POA: Insufficient documentation

## 2020-02-18 DIAGNOSIS — R600 Localized edema: Secondary | ICD-10-CM | POA: Insufficient documentation

## 2020-02-18 DIAGNOSIS — Z8543 Personal history of malignant neoplasm of ovary: Secondary | ICD-10-CM | POA: Diagnosis not present

## 2020-02-18 DIAGNOSIS — Z9049 Acquired absence of other specified parts of digestive tract: Secondary | ICD-10-CM | POA: Diagnosis not present

## 2020-02-18 LAB — CBC WITH DIFFERENTIAL (CANCER CENTER ONLY)
Abs Immature Granulocytes: 0.02 10*3/uL (ref 0.00–0.07)
Basophils Absolute: 0 10*3/uL (ref 0.0–0.1)
Basophils Relative: 0 %
Eosinophils Absolute: 0.2 10*3/uL (ref 0.0–0.5)
Eosinophils Relative: 1 %
HCT: 30.6 % — ABNORMAL LOW (ref 36.0–46.0)
Hemoglobin: 10.6 g/dL — ABNORMAL LOW (ref 12.0–15.0)
Immature Granulocytes: 0 %
Lymphocytes Relative: 20 %
Lymphs Abs: 2.2 10*3/uL (ref 0.7–4.0)
MCH: 31.4 pg (ref 26.0–34.0)
MCHC: 34.6 g/dL (ref 30.0–36.0)
MCV: 90.5 fL (ref 80.0–100.0)
Monocytes Absolute: 1.2 10*3/uL — ABNORMAL HIGH (ref 0.1–1.0)
Monocytes Relative: 11 %
Neutro Abs: 7 10*3/uL (ref 1.7–7.7)
Neutrophils Relative %: 68 %
Platelet Count: 309 10*3/uL (ref 150–400)
RBC: 3.38 MIL/uL — ABNORMAL LOW (ref 3.87–5.11)
RDW: 13.2 % (ref 11.5–15.5)
WBC Count: 10.6 10*3/uL — ABNORMAL HIGH (ref 4.0–10.5)
nRBC: 0 % (ref 0.0–0.2)

## 2020-02-18 LAB — CMP (CANCER CENTER ONLY)
ALT: 31 U/L (ref 0–44)
AST: 28 U/L (ref 15–41)
Albumin: 3.3 g/dL — ABNORMAL LOW (ref 3.5–5.0)
Alkaline Phosphatase: 94 U/L (ref 38–126)
Anion gap: 6 (ref 5–15)
BUN: 9 mg/dL (ref 8–23)
CO2: 28 mmol/L (ref 22–32)
Calcium: 10.1 mg/dL (ref 8.9–10.3)
Chloride: 99 mmol/L (ref 98–111)
Creatinine: 0.83 mg/dL (ref 0.44–1.00)
GFR, Est AFR Am: >60 mL/min (ref >60)
GFR, Estimated: >60 mL/min (ref >60)
Glucose, Bld: 88 mg/dL (ref 70–99)
Potassium: 4.2 mmol/L (ref 3.5–5.1)
Sodium: 133 mmol/L — ABNORMAL LOW (ref 135–145)
Total Bilirubin: 0.5 mg/dL (ref 0.3–1.2)
Total Protein: 7.4 g/dL (ref 6.5–8.1)

## 2020-02-18 MED ORDER — SODIUM CHLORIDE 0.9% FLUSH
10.0000 mL | Freq: Once | INTRAVENOUS | Status: AC
  Administered 2020-02-18: 10 mL
  Filled 2020-02-18: qty 10

## 2020-02-18 MED ORDER — HEPARIN SOD (PORK) LOCK FLUSH 100 UNIT/ML IV SOLN
500.0000 [IU] | Freq: Once | INTRAVENOUS | Status: AC
  Administered 2020-02-18: 500 [IU]
  Filled 2020-02-18: qty 5

## 2020-02-18 NOTE — Telephone Encounter (Signed)
Pls ask her to come in today for labs (I have standing orders for CBC, CMP and CA-125) and then stop by [facility] for abdminal X-ray I can see her tomorrow, 20 mins appt Continue on clear liquid diet

## 2020-02-18 NOTE — Telephone Encounter (Signed)
Called Connie Heath back and scheduled labs to today at 1:00, and then she should go to radiology for the abdominal x-ray.  Also scheduled appointment with Dr. Alvy Bimler tomorrow at 8:40.  She verbalized understanding and agreement of appointments.

## 2020-02-18 NOTE — Patient Instructions (Signed)

## 2020-02-18 NOTE — Telephone Encounter (Signed)
Connie Heath called and said she is having abdominal pain that started on Thursday afternoon.  She said it started in her left lower quadrant and has moved over to the right quadrant and is also radiating to her lower back.  She said the pain is more severe.  She started a clear liquid diet on Friday.  She is passing gas, last good BM was Saturday evening.  She had "drips and drabs" of stool yesterday.  She was taking Miralax PRN (stopped 2 days ago).  She is taking tylenol for the pain but it isn't helping.  She is wondering what to do and isworried that the cancer is back or she has an inflamed bowel.

## 2020-02-19 ENCOUNTER — Telehealth: Payer: Self-pay

## 2020-02-19 ENCOUNTER — Inpatient Hospital Stay: Payer: Medicare HMO

## 2020-02-19 ENCOUNTER — Encounter: Payer: Self-pay | Admitting: Hematology and Oncology

## 2020-02-19 ENCOUNTER — Other Ambulatory Visit: Payer: Self-pay

## 2020-02-19 ENCOUNTER — Other Ambulatory Visit: Payer: Self-pay | Admitting: Hematology and Oncology

## 2020-02-19 ENCOUNTER — Inpatient Hospital Stay (HOSPITAL_BASED_OUTPATIENT_CLINIC_OR_DEPARTMENT_OTHER): Payer: Medicare HMO | Admitting: Hematology and Oncology

## 2020-02-19 VITALS — BP 123/74 | HR 100 | Temp 97.1°F | Resp 18 | Ht 67.0 in | Wt 186.6 lb

## 2020-02-19 DIAGNOSIS — D638 Anemia in other chronic diseases classified elsewhere: Secondary | ICD-10-CM

## 2020-02-19 DIAGNOSIS — Z66 Do not resuscitate: Secondary | ICD-10-CM | POA: Diagnosis not present

## 2020-02-19 DIAGNOSIS — R1032 Left lower quadrant pain: Secondary | ICD-10-CM | POA: Diagnosis not present

## 2020-02-19 DIAGNOSIS — N3000 Acute cystitis without hematuria: Secondary | ICD-10-CM

## 2020-02-19 DIAGNOSIS — C786 Secondary malignant neoplasm of retroperitoneum and peritoneum: Secondary | ICD-10-CM

## 2020-02-19 DIAGNOSIS — R300 Dysuria: Secondary | ICD-10-CM

## 2020-02-19 DIAGNOSIS — R1084 Generalized abdominal pain: Secondary | ICD-10-CM | POA: Diagnosis not present

## 2020-02-19 DIAGNOSIS — I700 Atherosclerosis of aorta: Secondary | ICD-10-CM | POA: Diagnosis not present

## 2020-02-19 DIAGNOSIS — Z23 Encounter for immunization: Secondary | ICD-10-CM | POA: Diagnosis not present

## 2020-02-19 DIAGNOSIS — C561 Malignant neoplasm of right ovary: Secondary | ICD-10-CM | POA: Diagnosis not present

## 2020-02-19 DIAGNOSIS — K5900 Constipation, unspecified: Secondary | ICD-10-CM | POA: Insufficient documentation

## 2020-02-19 DIAGNOSIS — N390 Urinary tract infection, site not specified: Secondary | ICD-10-CM | POA: Insufficient documentation

## 2020-02-19 DIAGNOSIS — K5909 Other constipation: Secondary | ICD-10-CM | POA: Diagnosis not present

## 2020-02-19 DIAGNOSIS — Z5111 Encounter for antineoplastic chemotherapy: Secondary | ICD-10-CM | POA: Diagnosis not present

## 2020-02-19 LAB — URINALYSIS, COMPLETE (UACMP) WITH MICROSCOPIC
Bilirubin Urine: NEGATIVE
Glucose, UA: NEGATIVE mg/dL
Hgb urine dipstick: NEGATIVE
Ketones, ur: NEGATIVE mg/dL
Nitrite: NEGATIVE
Protein, ur: NEGATIVE mg/dL
Specific Gravity, Urine: 1.018 (ref 1.005–1.030)
WBC, UA: >50 WBC/hpf — ABNORMAL HIGH (ref 0–5)
pH: 5 (ref 5.0–8.0)

## 2020-02-19 LAB — CA 125: Cancer Antigen (CA) 125: 10.9 U/mL (ref 0.0–38.1)

## 2020-02-19 MED ORDER — AMOXICILLIN 500 MG PO TABS: 500.0000 mg | ORAL_TABLET | Freq: Two times a day (BID) | ORAL | 0 refills | Status: DC

## 2020-02-19 MED ORDER — OXYCODONE HCL 5 MG PO TABS: 5.0000 mg | ORAL_TABLET | ORAL | 0 refills | Status: DC | PRN

## 2020-02-19 MED FILL — oxyCODONE HCL 5 MG TABS: 5 | 5 days supply | Qty: 30 | Fill #0

## 2020-02-19 NOTE — Assessment & Plan Note (Signed)
At the time of dictation, urinalysis came back grossly abnormal With her flank pain and suprapubic tenderness, I am concerned about possible pyelonephritis I will prescribe antibiotic treatment as soon as possible We will follow up on results of urine culture

## 2020-02-19 NOTE — Assessment & Plan Note (Signed)
She has recurrent anemia, worrisome for cancer recurrence Observe for now Urgent CT as above

## 2020-02-19 NOTE — Assessment & Plan Note (Signed)
She has new onset of abdominal pain and flank pain She has no rebound or guarding I will order urinalysis and urine culture I will give her a small prescription of oxycodone to take as needed As above, we will order urgent CT for evaluation

## 2020-02-19 NOTE — Assessment & Plan Note (Signed)
She has no changes in bowel habits On careful examination of her x-ray, I did see some evidence of constipation I recommend gentle laxative therapy as tolerated I recommend she continues on soft/bland diet Her last colonoscopy in 2017 was unremarkable

## 2020-02-19 NOTE — Progress Notes (Signed)
[place] OFFICE PROGRESS NOTE  Patient Care Team: Michael Boston, MD as PCP - General (Internal Medicine) Awanda Mink Craige Cotta, RN as Oncology Nurse Navigator (Oncology)  ASSESSMENT & PLAN:  Ovarian cancer on right [facility]) Her signs and symptoms of severe abdominal pain and changes in bowel habits are worrisome for recurrent ovarian cancer The abdominal x-ray was not helpful Her physical exam is not normal Even though her CA-125 was not grossly elevated, I felt that it is important to pursue CT imaging study urgently to rule out cancer recurrence She is in agreement to proceed  Abdominal pain She has new onset of abdominal pain and flank pain She has no rebound or guarding I will order urinalysis and urine culture I will give her a small prescription of oxycodone to take as needed As above, we will order urgent CT for evaluation  Other constipation She has no changes in bowel habits On careful examination of her x-ray, I did see some evidence of constipation I recommend gentle laxative therapy as tolerated I recommend she continues on soft/bland diet Her last colonoscopy in 2017 was unremarkable  Anemia, chronic disease She has recurrent anemia, worrisome for cancer recurrence Observe for now Urgent CT as above  UTI (urinary tract infection) At the time of dictation, urinalysis came back grossly abnormal With her flank pain and suprapubic tenderness, I am concerned about possible pyelonephritis I will prescribe antibiotic treatment as soon as possible We will follow up on results of urine culture   Orders Placed This Encounter  Procedures  . Urine Culture    Standing Status:   Future    Number of Occurrences:   1    Standing Expiration Date:   02/18/2021  . CT ABDOMEN PELVIS W CONTRAST    Severe generalized abdominal pain    Standing Status:   Future    Standing Expiration Date:   02/18/2021    Order Specific Question:   If indicated for the ordered procedure,  I authorize the administration of contrast media per Radiology protocol    Answer:   Yes    Order Specific Question:   Preferred imaging location?    Answer:   [facility], [facility]    Order Specific Question:   Radiology Contrast Protocol - do NOT remove file path    Answer:   [email or web address]  . Urinalysis, Complete w Microscopic    Standing Status:   Future    Number of Occurrences:   1    Standing Expiration Date:   02/18/2021    All questions were answered. The patient knows to call the clinic with any problems, questions or concerns. The total time spent in the appointment was 40 minutes encounter with patients including review of chart and various tests results, discussions about plan of care and coordination of care plan   [name], MD 02/19/2020 3:09 PM  INTERVAL HISTORY: Please see below for problem oriented charting. She is seen urgently for evaluation Starting on September 30, she presented with left lower quadrant pain radiating up to the suprapubic region and on the back She also have some mild lower back pain especially when she tries to sit Her pain is moderate to severe and she took acetaminophen without relief She also have crampy abdominal pain, alleviated with bowel movement or passage of gas She is constipated Her last regular bowel movement was on Saturday, 4 days ago She changed her oral intake to bland diet She has lost appetite No nausea  or vomiting No recent infection, fever or chills Denies hematuria or urinary frequency Mild suprapubic discomfort  SUMMARY OF ONCOLOGIC HISTORY: Oncology History Overview Note  Neg genetics   Ovarian cancer on right ([place])  01/16/2019 Initial Diagnosis   She had vague symptom of dyspareunia. Presented to ER for evaluation in November for abdominal pain and vague symptom of fullness and imaging showed abnormalities   04/16/2019 Imaging   1. Nonvisualization of the appendix.  However, there is a large volume of ascites within the abdomen and pelvis which appears partially loculated. In the acute setting findings may be the sequelae of peritonitis. However, there are multiple partially calcified peritoneal nodules throughout the omentum which are concerning for peritoneal carcinomatosis. Noncalcified nodule in the left lower quadrant peritoneal reflection is noted. Further investigation with diagnostic paracentesis is advised. 2. Ill-defined low-density mass within the right iliac fossa is suspected and may represent an ovarian neoplasm. 3. Aortic atherosclerosis. 4. Small bilateral pleural effusions. 5. Hiatal hernia. 6. Prior granulomatous disease.   Aortic Atherosclerosis (ICD10-I70.0).   04/17/2019 Tumor Marker   Patient's tumor was tested for the following markers: CA-125 Results of the tumor marker test revealed 288.   04/18/2019 Procedure   Successful ultrasound-guided diagnostic and therapeutic paracentesis yielding 3.2 liters of peritoneal fluid.   04/18/2019 Pathology Results   FINAL MICROSCOPIC DIAGNOSIS:  - Malignant cells present  - See comment   SPECIMEN ADEQUACY:  Satisfactory for evaluation   DIAGNOSTIC COMMENTS:  The cell block has scattered malignant cells.  These atypical cells are positive for cytokeraitn 7, MOC-31, Pax8, and WT-1 but negative for TTF-1 and cytokeratin 20.  Overall, the features are consistent with a primary gynecologic neoplasm   04/20/2019 Cancer Staging   Staging form: Ovary, Fallopian Tube, and Primary Peritoneal Carcinoma, AJCC 8th Edition - Clinical stage from 04/20/2019: FIGO Stage IIIC (cT3c, cN0, cM0) - Signed by [name], MD on 04/20/2019   04/25/2019 Procedure   Successful ultrasound-guided therapeutic paracentesis yielding 2.5 liters of peritoneal fluid.     04/27/2019 - 09/10/2019 Chemotherapy   The patient had carboplatin and taxol x 3 cycles for neoadjuvant chemotherapy treatment, followed by interval  debulking surgery and 3 more cycles of chemotherapy     05/17/2019 Procedure   Placement of a subcutaneous port device. Catheter tip at the SVC and right atrium junction.   06/11/2019 Tumor Marker   Patient's tumor was tested for the following markers: CA-125 Results of the tumor marker test revealed 20.8   06/18/2019 Imaging   CT chest, abdomen and pelvis 1. Positive response to therapy. Complex 6.6 cm cystic right adnexal mass is decreased in size. Omental nodularity has largely resolved. Ascites has resolved. No adenopathy. 2. No evidence of metastatic disease in the chest. 3. Chronic findings include: Aortic Atherosclerosis (ICD10-I70.0). Small hiatal hernia. Mild sigmoid diverticulosis.   07/03/2019 Surgery   Preoperative Diagnosis: ovarian cancer stage IIIC     Procedure(s) Performed: Robotic-assisted laparoscopic bilateral salpingo-oophorectomy, omentectomy, radical tumor debulking with mini-laparotomy. Optimal cytoreduction with no gross residual disease remaining.    Surgeon: Everitt Amber, M.D. Specimens: Bilateral ovaries, fallopian tubes, omentum    Indication for Procedure:  Patient has a history of stage IIIC ovarian cancer, s/p 3 cycles of neoadjuvant chemotherapy with good partial clinical response. Right ovarian mass on imaging and omental caking.   Operative Findings: 6-8cm right ovarian mass densely adherent to pelvic sidewall with filmy adhesions to colonic epiploica but not bowel wall. Normal appendix. Slightly enlarged left tube and ovary  but without discrete mass. Omental nodularity throughout infracolic omentum. Tiny miliary <59mm nodules on left diaphragm.    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 Grade: High-grade Other Tissue/ Organ Involvement: Left ovary and omentum Largest Extrapelvic Peritoneal Focus: 4.3 cm tumor deposit in the omentum Peritoneal/Ascitic Fluid: Positive for carcinoma ([id number]) Treatment Effect: Moderate response identified (CRS 2) Regional Lymph Nodes: No lymph nodes submitted or found Pathologic Stage Classification (pTNM, AJCC 8th Edition): pT3c, pNx Representative Tumor Block: B10 Comment(s): Only the serous component shows metastasis to the omentum and spread to left ovary and right fallopian tube   07/27/2019 Tumor Marker   Patient's tumor was tested for the following markers: CA-125 Results of the tumor marker test revealed 13.7   08/14/2019 Genetic Testing   Negative germline + somatic testing. No pathogenic variants identified on the [employer]. VUS in MRE11A identified in the germline.  The report date is 08/14/2019.   The CancerNext gene panel offered by [employer] includes sequencing and rearrangement analysis for the following 36 genes: APC*, ATM*, AXIN2, BARD1, BMPR1A, BRCA1*, BRCA2*, BRIP1*, CDH1*, CDK4, CDKN2A, CHEK2*, DICER1, MLH1*, MSH2*, MSH3, MSH6*, MUTYH*, NBN, NF1*, NTHL1, PALB2*, PMS2*, PTEN*, RAD51C*, RAD51D*, RECQL, SMAD4, SMARCA4, STK11 and TP53* (sequencing and deletion/duplication); HOXB13, POLD1 and POLE (sequencing only); EPCAM and GREM1 (deletion/duplication only).   Somatic genes analyzed through TumorNext-HRD: ATM, BARD1, BRCA1, BRCA2, BRIP1, CHEK2, MRE11A, NBN, PALB2, RAD51C, RAD51D.     09/10/2019 Tumor Marker   Patient's tumor was tested for the following markers: CA-125 Results of the tumor marker test revealed 9.5   10/10/2019 Imaging   1. No findings suspicious for recurrent metastatic disease in the abdomen or pelvis. No adenopathy. Minimal smooth peritoneal thickening in  the paracolic gutters and deep pelvis, nonspecific, with no discrete peritoneal implants. No ascites. Continued CT surveillance warranted. 2. Chronic findings include: Aortic Atherosclerosis (ICD10-I70.0). Small hiatal hernia. Minimal sigmoid diverticulosis.     10/10/2019 Tumor Marker   Patient's tumor was tested for the following markers: CA-125 Results of the tumor marker test revealed 8.9   02/18/2020 Tumor Marker   Patient's tumor was tested for the following markers: CA-125. Results of the tumor marker test revealed 10.9   02/18/2020 Imaging   No acute abnormality noted.       REVIEW OF SYSTEMS:   Constitutional: Denies fevers, chills or abnormal weight loss Eyes: Denies blurriness of vision Ears, nose, mouth, throat, and face: Denies mucositis or sore throat Respiratory: Denies cough, dyspnea or wheezes Cardiovascular: Denies palpitation, chest discomfort or lower extremity swelling Skin: Denies abnormal skin rashes Lymphatics: Denies new lymphadenopathy or easy bruising Neurological:Denies numbness, tingling or new weaknesses Behavioral/Psych: Mood is stable, no new changes  All other systems were reviewed with the patient and are negative.  I have reviewed the past medical history, past surgical history, social history and family history with the patient and they are unchanged from previous note.  ALLERGIES:  is allergic to bee venom, amlodipine, demerol [meperidine hcl], lisinopril, losartan, and other.  MEDICATIONS:  Current Outpatient Medications  Medication Sig Dispense Refill  . amoxicillin (AMOXIL) 500 MG tablet Take 1 tablet (500 mg total) by mouth 2 (two) times daily. 14 tablet 0  . B Complex  Vitamins (VITAMIN B COMPLEX PO) Take 1 tablet by mouth daily.    . Cholecalciferol (VITAMIN D-3 PO) Take 4,000 Units by mouth daily.     Marland Kitchen estradiol (ESTRACE VAGINAL) 0.1 MG/GM vaginal cream Place 1 Applicatorful vaginally 3 (three) times a week. (Patient taking differently:  Place 1 Applicatorful vaginally as needed. ) 42.5 g 12  . FLUoxetine (PROZAC) 20 MG capsule Take 1 capsule (20 mg total) by mouth daily. 90 capsule 3  . lidocaine-prilocaine (EMLA) cream Apply to affected area once (Patient taking differently: Apply 1 application topically daily as needed (port access). ) 30 g 3  . loratadine (CLARITIN) 10 MG tablet Take 10 mg by mouth daily as needed for allergies.    Marland Kitchen oxyCODONE (OXY IR/ROXICODONE) 5 MG immediate release tablet Take 1 tablet (5 mg total) by mouth every 4 (four) hours as needed for severe pain. 30 tablet 0  . simvastatin (ZOCOR) 40 MG tablet Take 1 tablet (40 mg total) by mouth at bedtime. 90 tablet 4   No current facility-administered medications for this visit.    PHYSICAL EXAMINATION: ECOG PERFORMANCE STATUS: 1 - Symptomatic but completely ambulatory  Vitals:   02/19/20 0901  BP: 123/74  Pulse: 100  Resp: 18  Temp: (!) 97.1 F (36.2 C)  SpO2: 100%   Filed Weights   02/19/20 0901  Weight: 186 lb 9.6 oz (84.6 kg)    GENERAL:alert, no distress and comfortable SKIN: skin color, texture, turgor are normal, no rashes or significant lesions EYES: normal, Conjunctiva are pink and non-injected, sclera clear OROPHARYNX:no exudate, no erythema and lips, buccal mucosa, and tongue normal  NECK: supple, thyroid normal size, non-tender, without nodularity LYMPH:  no palpable lymphadenopathy in the cervical, axillary or inguinal LUNGS: clear to auscultation and percussion with normal breathing effort HEART: regular rate & rhythm and no murmurs and no lower extremity edema ABDOMEN:abdomen soft, mild tenderness on palpation suprapubic region, and right flank discomfort Musculoskeletal:no cyanosis of digits and no clubbing  NEURO: alert & oriented x 3 with fluent speech, no focal motor/sensory deficits  LABORATORY DATA:  I have reviewed the data as listed    Component Value Date/Time   NA 133 (L) 02/18/2020 1306   NA 137 10/27/2011 0738    K 4.2 02/18/2020 1306   K 4.1 10/27/2011 0738   CL 99 02/18/2020 1306   CL 105 10/27/2011 0738   CO2 28 02/18/2020 1306   CO2 26 10/27/2011 0738   GLUCOSE 88 02/18/2020 1306   GLUCOSE 111 (H) 10/27/2011 0738   BUN 9 02/18/2020 1306   BUN 15 10/27/2011 0738   CREATININE 0.83 02/18/2020 1306   CREATININE 1.01 (H) 12/16/2015 1030   CALCIUM 10.1 02/18/2020 1306   CALCIUM 10.0 09/28/2013 0000   PROT 7.4 02/18/2020 1306   PROT 8.1 10/27/2011 0738   ALBUMIN 3.3 (L) 02/18/2020 1306   ALBUMIN 3.9 10/27/2011 0738   AST 28 02/18/2020 1306   ALT 31 02/18/2020 1306   ALT 29 10/27/2011 0738   ALKPHOS 94 02/18/2020 1306   ALKPHOS 102 10/27/2011 0738   BILITOT 0.5 02/18/2020 1306   GFRNONAA >60 02/18/2020 1306   GFRNONAA 59 (L) 12/16/2015 1030   GFRAA >60 02/18/2020 1306   GFRAA 68 12/16/2015 1030    No results found for: SPEP, UPEP  Lab Results  Component Value Date   WBC 10.6 (H) 02/18/2020   NEUTROABS 7.0 02/18/2020   HGB 10.6 (L) 02/18/2020   HCT 30.6 (L) 02/18/2020   MCV 90.5  02/18/2020   PLT 309 02/18/2020      Chemistry      Component Value Date/Time   NA 133 (L) 02/18/2020 1306   NA 137 10/27/2011 0738   K 4.2 02/18/2020 1306   K 4.1 10/27/2011 0738   CL 99 02/18/2020 1306   CL 105 10/27/2011 0738   CO2 28 02/18/2020 1306   CO2 26 10/27/2011 0738   BUN 9 02/18/2020 1306   BUN 15 10/27/2011 0738   CREATININE 0.83 02/18/2020 1306   CREATININE 1.01 (H) 12/16/2015 1030   GLU 98 09/28/2013 0000      Component Value Date/Time   CALCIUM 10.1 02/18/2020 1306   CALCIUM 10.0 09/28/2013 0000   ALKPHOS 94 02/18/2020 1306   ALKPHOS 102 10/27/2011 0738   AST 28 02/18/2020 1306   ALT 31 02/18/2020 1306   ALT 29 10/27/2011 0738   BILITOT 0.5 02/18/2020 1306       RADIOGRAPHIC STUDIES: I have reviewed abdominal x-ray with the patient I have personally reviewed the radiological images as listed and agreed with the findings in the report. DG BONE DENSITY  (DXA)  Result Date: 02/08/2020 EXAM: DUAL X-RAY ABSORPTIOMETRY (DXA) FOR BONE MINERAL DENSITY IMPRESSION: Referring Physician:  Michael Boston Your patient completed a BMD test using Lunar IDXA DXA system ( analysis version: 16 ) manufactured by [employer]. Technologist: AW PATIENT: Name: Connie Heath, Connie Heath Patient ID: [id number] Birth Date: [birth year] Height: 66.0 in. Sex: Female Measured: 02/08/2020 Weight: 190.2 lbs. Indications: Bilateral Ovariectomy (65.51), Caucasian, Estrogen Deficient, Hysterectomy, Postmenopausal, Prozac, Right hip replacement Fractures: None Treatments: Vitamin D (E933.5) ASSESSMENT: The BMD measured at Forearm Radius 33% is 0.872 g/cm2 with a T-score of -0.2. This patient is considered normal according to [facility]) criteria. The scan quality is good. L-3 and L-4 were excluded due to degenerative changes.Right femur was excluded due to surgical hardware. Site Region Measured Date Measured Age YA BMD Significant CHANGE T-score Left Forearm Radius 33% 02/08/[age] 67.9 -1.2 0.872 g/cm2 AP Spine L1-L2 02/08/2020 67.9 2.3 1.423 g/cm2 Left Femur Neck 02/08/2020 67.9 1.3 1.206 g/cm2 Left Femur Total 02/08/2020 67.9 1.5 1.182 g/cm2 World Health Organization [facility]) criteria for post-menopausal, Caucasian Women: Normal       T-score at or above -1 SD Osteopenia   T-score between -1 and -2.5 SD Osteoporosis T-score at or below -2.5 SD RECOMMENDATION: 1. All patients should optimize calcium and vitamin D intake. 2. Consider FDA approved medical therapies in postmenopausal women and men aged 36 years and older, based on the following: a. A hip or vertebral (clinical or morphometric) fracture b. T- score < or = -2.5 at the femoral neck or spine after appropriate evaluation to exclude secondary causes c. Low bone mass (T-score between -1.0 and -2.5 at the femoral neck or spine) and a 10 year probability of a hip fracture > or = 3% or a 10 year probability of a major  osteoporosis-related fracture > or = 20% based on the [place]-adapted WHO algorithm d. Clinician judgment and/or patient preferences may indicate treatment for people with 10-year fracture probabilities above or below these levels FOLLOW-UP: Patients with diagnosis of osteoporosis or at high risk for fracture should have regular bone mineral density tests. For patients eligible for Medicare, routine testing is allowed once every 2 years. The testing frequency can be increased to one year for patients who have rapidly progressing disease, those who are receiving or discontinuing medical therapy to restore bone mass, or have additional risk  factors. I have reviewed this report and agree with the above findings. [facility] Radiology Electronically Signed   By: Lowella Grip III M.D.   On: 02/08/2020 14:20   DG Abd 2 Views  Result Date: 02/18/2020 CLINICAL DATA:  Abdominal pain, history of ovarian carcinoma EXAM: ABDOMEN - 2 VIEW COMPARISON:  10/10/2019 FINDINGS: Scattered large and small bowel gas is noted. No abnormal mass or abnormal calcifications are seen. Changes of prior right hip replacement and cholecystectomy are noted. Mild degenerative changes of lumbar spine are seen. IMPRESSION: No acute abnormality noted. Electronically Signed   By: Inez Catalina M.D.   On: 02/18/2020 21:13

## 2020-02-19 NOTE — Assessment & Plan Note (Signed)
Her signs and symptoms of severe abdominal pain and changes in bowel habits are worrisome for recurrent ovarian cancer The abdominal x-ray was not helpful Her physical exam is not normal Even though her CA-125 was not grossly elevated, I felt that it is important to pursue CT imaging study urgently to rule out cancer recurrence She is in agreement to proceed

## 2020-02-19 NOTE — Telephone Encounter (Signed)
Called pt per Dr Alvy Bimler to inform her that Dr Alvy Bimler called in Amoxicillin  500 mg 1 tab PO BID x7 days. Pt is in agreement with this and states she scheduled her CT for 10/7 at 0900 at [facility]. Pt understands to call with any concerns or if sx do not improve.

## 2020-02-21 ENCOUNTER — Ambulatory Visit (HOSPITAL_BASED_OUTPATIENT_CLINIC_OR_DEPARTMENT_OTHER)
Admission: RE | Admit: 2020-02-21 | Discharge: 2020-02-21 | Disposition: A | Discharge status: Home / Self Care | Payer: Medicare HMO | Source: Ambulatory Visit | Attending: Hematology and Oncology | Admitting: Hematology and Oncology

## 2020-02-21 ENCOUNTER — Telehealth: Payer: Self-pay

## 2020-02-21 ENCOUNTER — Inpatient Hospital Stay: Payer: Medicare HMO

## 2020-02-21 ENCOUNTER — Encounter (HOSPITAL_BASED_OUTPATIENT_CLINIC_OR_DEPARTMENT_OTHER): Payer: Self-pay

## 2020-02-21 ENCOUNTER — Other Ambulatory Visit: Payer: Self-pay

## 2020-02-21 VITALS — BP 125/78 | HR 82 | Temp 97.9°F | Resp 18

## 2020-02-21 DIAGNOSIS — I700 Atherosclerosis of aorta: Secondary | ICD-10-CM | POA: Diagnosis not present

## 2020-02-21 DIAGNOSIS — Z66 Do not resuscitate: Secondary | ICD-10-CM | POA: Diagnosis not present

## 2020-02-21 DIAGNOSIS — C561 Malignant neoplasm of right ovary: Secondary | ICD-10-CM | POA: Diagnosis not present

## 2020-02-21 DIAGNOSIS — N1330 Unspecified hydronephrosis: Secondary | ICD-10-CM | POA: Diagnosis not present

## 2020-02-21 DIAGNOSIS — K5909 Other constipation: Secondary | ICD-10-CM | POA: Diagnosis not present

## 2020-02-21 DIAGNOSIS — C786 Secondary malignant neoplasm of retroperitoneum and peritoneum: Secondary | ICD-10-CM | POA: Diagnosis not present

## 2020-02-21 DIAGNOSIS — K449 Diaphragmatic hernia without obstruction or gangrene: Secondary | ICD-10-CM | POA: Diagnosis not present

## 2020-02-21 DIAGNOSIS — R1084 Generalized abdominal pain: Secondary | ICD-10-CM | POA: Insufficient documentation

## 2020-02-21 DIAGNOSIS — Z23 Encounter for immunization: Secondary | ICD-10-CM | POA: Diagnosis not present

## 2020-02-21 DIAGNOSIS — C569 Malignant neoplasm of unspecified ovary: Secondary | ICD-10-CM | POA: Diagnosis not present

## 2020-02-21 DIAGNOSIS — R1032 Left lower quadrant pain: Secondary | ICD-10-CM | POA: Diagnosis not present

## 2020-02-21 DIAGNOSIS — K5730 Diverticulosis of large intestine without perforation or abscess without bleeding: Secondary | ICD-10-CM | POA: Diagnosis not present

## 2020-02-21 DIAGNOSIS — N390 Urinary tract infection, site not specified: Secondary | ICD-10-CM | POA: Diagnosis not present

## 2020-02-21 DIAGNOSIS — Z5111 Encounter for antineoplastic chemotherapy: Secondary | ICD-10-CM | POA: Diagnosis not present

## 2020-02-21 LAB — URINE CULTURE: Culture: >=100000 — AB

## 2020-02-21 MED ORDER — SODIUM CHLORIDE 0.9% FLUSH
10.0000 mL | Freq: Once | INTRAVENOUS | Status: AC
  Administered 2020-02-21: 10 mL
  Filled 2020-02-21: qty 10

## 2020-02-21 MED ORDER — IOHEXOL 300 MG/ML  SOLN
100.0000 mL | Freq: Once | INTRAMUSCULAR | Status: AC | PRN
  Administered 2020-02-21: 100 mL via INTRAVENOUS

## 2020-02-21 MED ORDER — HEPARIN SOD (PORK) LOCK FLUSH 100 UNIT/ML IV SOLN
500.0000 [IU] | Freq: Once | INTRAVENOUS | Status: AC
  Administered 2020-02-21: 500 [IU]
  Filled 2020-02-21: qty 5

## 2020-02-21 NOTE — Telephone Encounter (Signed)
I am reviewing scans with Dr. Denman George Nothing to be added now

## 2020-02-21 NOTE — Telephone Encounter (Signed)
-----   Message from [name], MD sent at 02/21/2020  9:42 AM EDT ----- Regarding: UTI Is her symptoms any better? We might need to switch antibiotics

## 2020-02-21 NOTE — Telephone Encounter (Signed)
Called with below message. She verbalized understanding. Her symptoms are better. No blood in her urine and she denies fever. Eating/ drinking with no problems.Complaining of right sided flank pain and she is taking 1/2 pain tab as needed.  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

## 2020-02-21 NOTE — Telephone Encounter (Signed)
Called and instructed to continue antibiotics and Dr. Alvy Bimler will see her tomorrow.

## 2020-02-21 NOTE — Patient Instructions (Signed)

## 2020-02-22 ENCOUNTER — Telehealth: Payer: Self-pay

## 2020-02-22 ENCOUNTER — Encounter: Payer: Self-pay | Admitting: Hematology and Oncology

## 2020-02-22 ENCOUNTER — Other Ambulatory Visit: Payer: Self-pay | Admitting: Hematology and Oncology

## 2020-02-22 ENCOUNTER — Other Ambulatory Visit: Payer: Self-pay

## 2020-02-22 ENCOUNTER — Inpatient Hospital Stay (HOSPITAL_BASED_OUTPATIENT_CLINIC_OR_DEPARTMENT_OTHER): Payer: Medicare HMO | Admitting: Hematology and Oncology

## 2020-02-22 VITALS — BP 148/76 | HR 95 | Temp 97.5°F | Resp 18 | Ht 67.0 in | Wt 187.2 lb

## 2020-02-22 DIAGNOSIS — N3000 Acute cystitis without hematuria: Secondary | ICD-10-CM | POA: Diagnosis not present

## 2020-02-22 DIAGNOSIS — R1032 Left lower quadrant pain: Secondary | ICD-10-CM | POA: Diagnosis not present

## 2020-02-22 DIAGNOSIS — Z5111 Encounter for antineoplastic chemotherapy: Secondary | ICD-10-CM | POA: Diagnosis not present

## 2020-02-22 DIAGNOSIS — N390 Urinary tract infection, site not specified: Secondary | ICD-10-CM | POA: Diagnosis not present

## 2020-02-22 DIAGNOSIS — N1330 Unspecified hydronephrosis: Secondary | ICD-10-CM | POA: Diagnosis not present

## 2020-02-22 DIAGNOSIS — C561 Malignant neoplasm of right ovary: Secondary | ICD-10-CM | POA: Diagnosis not present

## 2020-02-22 DIAGNOSIS — I700 Atherosclerosis of aorta: Secondary | ICD-10-CM | POA: Diagnosis not present

## 2020-02-22 DIAGNOSIS — R1084 Generalized abdominal pain: Secondary | ICD-10-CM

## 2020-02-22 DIAGNOSIS — C786 Secondary malignant neoplasm of retroperitoneum and peritoneum: Secondary | ICD-10-CM | POA: Diagnosis not present

## 2020-02-22 DIAGNOSIS — Z23 Encounter for immunization: Secondary | ICD-10-CM | POA: Diagnosis not present

## 2020-02-22 DIAGNOSIS — K5909 Other constipation: Secondary | ICD-10-CM

## 2020-02-22 DIAGNOSIS — Z66 Do not resuscitate: Secondary | ICD-10-CM | POA: Diagnosis not present

## 2020-02-22 MED ORDER — CEFDINIR 300 MG PO CAPS: 300.0000 mg | ORAL_CAPSULE | Freq: Two times a day (BID) | ORAL | 0 refills | Status: DC

## 2020-02-22 MED FILL — CEFDINIR 300 MG CAPSULE: 300 | 7 days supply | Qty: 14 | Fill #0

## 2020-02-22 NOTE — Progress Notes (Signed)
[place] OFFICE PROGRESS NOTE  Patient Care Team: Michael Boston, MD as PCP - General (Internal Medicine) Awanda Mink Craige Cotta, RN as Oncology Nurse Navigator (Oncology)  ASSESSMENT & PLAN:  Ovarian cancer on right [facility]) I have reviewed imaging studies with the patient and her husband Unfortunately, the CT imaging is worrisome for either cancer recurrence of pelvic abscess Pelvic abscess is less likely given her lack of fever or chills although the patient did not measure her temperature at home Her prior colonoscopy in 2017 was unremarkable I plan to present her case at the GYN oncology tumor board on Monday for further discussion and review plan of care I have briefly discussed her imaging study with her GYN surgeon  UTI (urinary tract infection) She has complicated UTI secondary to hydronephrosis from pelvic mass compression She have flank pain, worrisome for early signs of pyelonephritis Given her recent urine culture, I plan to change her antibiotic to [facility] and will reassess next week  Other constipation We have extensive discussions about management of constipation If she needs repeat colonoscopy, the patient would like to be referred to [facility] practice for repeat colonoscopy  Hydronephrosis of right kidney She is symptomatic with flank pain She had complicated UTI Thankfully, her recent renal function was stable I recommend referral to urology for consultation to see if ureteric stent is appropriate  Abdominal pain We have extensive discussion about pain management She has enough pain medicine until next week   Orders Placed This Encounter  Procedures  . Ambulatory referral to Urology    Referral Priority:   Urgent    Referral Type:   Consultation    Referral Reason:   Specialty Services Required    Requested Specialty:   Urology    Number of Visits Requested:   1    All questions were answered. The patient knows to call the clinic  with any problems, questions or concerns. The total time spent in the appointment was 40 minutes encounter with patients including review of chart and various tests results, discussions about plan of care and coordination of care plan   [name], MD 02/22/2020 10:37 AM  INTERVAL HISTORY: Please see below for problem oriented charting. She is seen urgently to review test results She continues to have intermittent right flank pain, slightly better with pain medicine No fever or chills although she have hot flashes at night Denies nausea vomiting Since she started using laxatives, she had several bowel movement Denies hematuria  SUMMARY OF ONCOLOGIC HISTORY: Oncology History Overview Note  Neg genetics   Ovarian cancer on right ([place])  01/16/2019 Initial Diagnosis   She had vague symptom of dyspareunia. Presented to ER for evaluation in November for abdominal pain and vague symptom of fullness and imaging showed abnormalities   04/16/2019 Imaging   1. Nonvisualization of the appendix. However, there is a large volume of ascites within the abdomen and pelvis which appears partially loculated. In the acute setting findings may be the sequelae of peritonitis. However, there are multiple partially calcified peritoneal nodules throughout the omentum which are concerning for peritoneal carcinomatosis. Noncalcified nodule in the left lower quadrant peritoneal reflection is noted. Further investigation with diagnostic paracentesis is advised. 2. Ill-defined low-density mass within the right iliac fossa is suspected and may represent an ovarian neoplasm. 3. Aortic atherosclerosis. 4. Small bilateral pleural effusions. 5. Hiatal hernia. 6. Prior granulomatous disease.   Aortic Atherosclerosis (ICD10-I70.0).   04/17/2019 Tumor Marker   Patient's tumor  was tested for the following markers: CA-125 Results of the tumor marker test revealed 288.   04/18/2019 Procedure   Successful ultrasound-guided  diagnostic and therapeutic paracentesis yielding 3.2 liters of peritoneal fluid.   04/18/2019 Pathology Results   FINAL MICROSCOPIC DIAGNOSIS:  - Malignant cells present  - See comment   SPECIMEN ADEQUACY:  Satisfactory for evaluation   DIAGNOSTIC COMMENTS:  The cell block has scattered malignant cells.  These atypical cells are positive for cytokeraitn 7, MOC-31, Pax8, and WT-1 but negative for TTF-1 and cytokeratin 20.  Overall, the features are consistent with a primary gynecologic neoplasm   04/20/2019 Cancer Staging   Staging form: Ovary, Fallopian Tube, and Primary Peritoneal Carcinoma, AJCC 8th Edition - Clinical stage from 04/20/2019: FIGO Stage IIIC (cT3c, cN0, cM0) - Signed by [name], MD on 04/20/2019   04/25/2019 Procedure   Successful ultrasound-guided therapeutic paracentesis yielding 2.5 liters of peritoneal fluid.     04/27/2019 - 09/10/2019 Chemotherapy   The patient had carboplatin and taxol x 3 cycles for neoadjuvant chemotherapy treatment, followed by interval debulking surgery and 3 more cycles of chemotherapy     05/17/2019 Procedure   Placement of a subcutaneous port device. Catheter tip at the SVC and right atrium junction.   06/11/2019 Tumor Marker   Patient's tumor was tested for the following markers: CA-125 Results of the tumor marker test revealed 20.8   06/18/2019 Imaging   CT chest, abdomen and pelvis 1. Positive response to therapy. Complex 6.6 cm cystic right adnexal mass is decreased in size. Omental nodularity has largely resolved. Ascites has resolved. No adenopathy. 2. No evidence of metastatic disease in the chest. 3. Chronic findings include: Aortic Atherosclerosis (ICD10-I70.0). Small hiatal hernia. Mild sigmoid diverticulosis.   07/03/2019 Surgery   Preoperative Diagnosis: ovarian cancer stage IIIC     Procedure(s) Performed: Robotic-assisted laparoscopic bilateral salpingo-oophorectomy, omentectomy, radical tumor debulking with  mini-laparotomy. Optimal cytoreduction with no gross residual disease remaining.    Surgeon: Everitt Amber, M.D. Specimens: Bilateral ovaries, fallopian tubes, omentum    Indication for Procedure:  Patient has a history of stage IIIC ovarian cancer, s/p 3 cycles of neoadjuvant chemotherapy with good partial clinical response. Right ovarian mass on imaging and omental caking.   Operative Findings: 6-8cm right ovarian mass densely adherent to pelvic sidewall with filmy adhesions to colonic epiploica but not bowel wall. Normal appendix. Slightly enlarged left tube and ovary but without discrete mass. Omental nodularity throughout infracolic omentum. Tiny miliary <17mm nodules on left diaphragm.    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 Other Tissue/ Organ Involvement: Left ovary and omentum Largest Extrapelvic Peritoneal Focus: 4.3 cm tumor deposit in the omentum Peritoneal/Ascitic Fluid: Positive for carcinoma ([id number]) Treatment Effect: Moderate response identified (CRS 2) Regional Lymph Nodes: No lymph nodes submitted or found Pathologic Stage Classification (pTNM, AJCC 8th Edition): pT3c, pNx Representative Tumor Block: B10 Comment(s): Only the serous component shows metastasis to the omentum and spread to left ovary and right fallopian tube   07/27/2019 Tumor Marker   Patient's tumor was tested for the following markers: CA-125 Results of the tumor marker test revealed 13.7   08/14/2019 Genetic Testing   Negative germline + somatic testing. No pathogenic variants  identified on the  [employer] TumorNext-HRD+CancerNext panel. VUS in MRE11A identified in the germline.  The report date is 08/14/2019.   The CancerNext gene panel offered by [employer] includes sequencing and rearrangement analysis for the following 36 genes: APC*, ATM*, AXIN2, BARD1, BMPR1A, BRCA1*, BRCA2*, BRIP1*, CDH1*, CDK4, CDKN2A, CHEK2*, DICER1, MLH1*, MSH2*, MSH3, MSH6*, MUTYH*, NBN, NF1*, NTHL1, PALB2*, PMS2*, PTEN*, RAD51C*, RAD51D*, RECQL, SMAD4, SMARCA4, STK11 and TP53* (sequencing and deletion/duplication); HOXB13, POLD1 and POLE (sequencing only); EPCAM and GREM1 (deletion/duplication only).   Somatic genes analyzed through TumorNext-HRD: ATM, BARD1, BRCA1, BRCA2, BRIP1, CHEK2, MRE11A, NBN, PALB2, RAD51C, RAD51D.     09/10/2019 Tumor Marker   Patient's tumor was tested for the following markers: CA-125 Results of the tumor marker test revealed 9.5   10/10/2019 Imaging   1. No findings suspicious for recurrent metastatic disease in the abdomen or pelvis. No adenopathy. Minimal smooth peritoneal thickening in the paracolic gutters and deep pelvis, nonspecific, with no discrete peritoneal implants. No ascites. Continued CT surveillance warranted. 2. Chronic findings include: Aortic Atherosclerosis (ICD10-I70.0). Small hiatal hernia. Minimal sigmoid diverticulosis.     10/10/2019 Tumor Marker   Patient's tumor was tested for the following markers: CA-125 Results of the tumor marker test revealed 8.9   02/18/2020 Tumor Marker   Patient's tumor was tested for the following markers: CA-125. Results of the tumor marker test revealed 10.9   02/18/2020 Imaging   No acute abnormality noted.     02/21/2020 Imaging   1. Complex cystic mass versus less likely complex abscess along the vaginal cuff and extending anterior to the rectum and along the right lateral side of the rectosigmoid junction, with components of the process extending up towards the adnexa, right greater than left. This measures up to 10.9 cm  in diameter. If this represents an abscess I would expect the patient have striking symptoms (fever, pain, etc.). Given the patient's history, the appearance tends to favor recurrent ovarian malignancy over abscess. 2. Mild right hydronephrosis and hydroureter extending down to the level of the recurrent pelvic mass, indicating some low-grade obstruction. 3. There is sigmoid colon diverticulosis along with some stranding and wall indistinctness of the sigmoid colon which could reflect diverticulitis or colitis. Assessment is made more complex by the proximity of the pelvic mass. 4. Prominence of stool proximal to the sigmoid colon possibly from constipation or low-grade partial obstruction. 5. Other imaging findings of potential clinical significance: Small type 1 hiatal hernia. Old granulomatous disease. Lumbar spondylosis and degenerative disc disease causing impingement at L3-4, L4-5, and L5-S1. Mild rectus diastasis with transverse colon and adipose tissue extending along the diastatic region. 6. Aortic atherosclerosis.     REVIEW OF SYSTEMS:   Constitutional: Denies fevers, chills or abnormal weight loss Eyes: Denies blurriness of vision Ears, nose, mouth, throat, and face: Denies mucositis or sore throat Respiratory: Denies cough, dyspnea or wheezes Cardiovascular: Denies palpitation, chest discomfort or lower extremity swelling Skin: Denies abnormal skin rashes Lymphatics: Denies new lymphadenopathy or easy bruising Neurological:Denies numbness, tingling or new weaknesses Behavioral/Psych: Mood is stable, no new changes  All other systems were reviewed with the patient and are negative.  I have reviewed the past medical history, past surgical history, social history and family history with the patient and they are unchanged from previous note.  ALLERGIES:  is allergic to bee venom, amlodipine, demerol [meperidine hcl], lisinopril, losartan, and other.  MEDICATIONS:  Current  Outpatient Medications  Medication Sig Dispense Refill  . B Complex Vitamins (VITAMIN B  COMPLEX PO) Take 1 tablet by mouth daily.    . cefdinir (OMNICEF) 300 MG capsule Take 1 capsule (300 mg total) by mouth 2 (two) times daily. 14 capsule 0  . Cholecalciferol (VITAMIN D-3 PO) Take 4,000 Units by mouth daily.     Marland Kitchen estradiol (ESTRACE VAGINAL) 0.1 MG/GM vaginal cream Place 1 Applicatorful vaginally 3 (three) times a week. (Patient taking differently: Place 1 Applicatorful vaginally as needed. ) 42.5 g 12  . FLUoxetine (PROZAC) 20 MG capsule Take 1 capsule (20 mg total) by mouth daily. 90 capsule 3  . lidocaine-prilocaine (EMLA) cream Apply to affected area once (Patient taking differently: Apply 1 application topically daily as needed (port access). ) 30 g 3  . loratadine (CLARITIN) 10 MG tablet Take 10 mg by mouth daily as needed for allergies.    Marland Kitchen oxyCODONE (OXY IR/ROXICODONE) 5 MG immediate release tablet Take 1 tablet (5 mg total) by mouth every 4 (four) hours as needed for severe pain. 30 tablet 0  . simvastatin (ZOCOR) 40 MG tablet Take 1 tablet (40 mg total) by mouth at bedtime. 90 tablet 4   No current facility-administered medications for this visit.    PHYSICAL EXAMINATION: ECOG PERFORMANCE STATUS: 1 - Symptomatic but completely ambulatory  Vitals:   02/22/20 0953  BP: (!) 148/76  Pulse: 95  Resp: 18  Temp: (!) 97.5 F (36.4 C)  SpO2: 100%   Filed Weights   02/22/20 0953  Weight: 187 lb 3.2 oz (84.9 kg)    GENERAL:alert, no distress and comfortable NEURO: alert & oriented x 3 with fluent speech, no focal motor/sensory deficits  LABORATORY DATA:  I have reviewed the data as listed    Component Value Date/Time   NA 133 (L) 02/18/2020 1306   NA 137 10/27/2011 0738   K 4.2 02/18/2020 1306   K 4.1 10/27/2011 0738   CL 99 02/18/2020 1306   CL 105 10/27/2011 0738   CO2 28 02/18/2020 1306   CO2 26 10/27/2011 0738   GLUCOSE 88 02/18/2020 1306   GLUCOSE 111 (H)  10/27/2011 0738   BUN 9 02/18/2020 1306   BUN 15 10/27/2011 0738   CREATININE 0.83 02/18/2020 1306   CREATININE 1.01 (H) 12/16/2015 1030   CALCIUM 10.1 02/18/2020 1306   CALCIUM 10.0 09/28/2013 0000   PROT 7.4 02/18/2020 1306   PROT 8.1 10/27/2011 0738   ALBUMIN 3.3 (L) 02/18/2020 1306   ALBUMIN 3.9 10/27/2011 0738   AST 28 02/18/2020 1306   ALT 31 02/18/2020 1306   ALT 29 10/27/2011 0738   ALKPHOS 94 02/18/2020 1306   ALKPHOS 102 10/27/2011 0738   BILITOT 0.5 02/18/2020 1306   GFRNONAA >60 02/18/2020 1306   GFRNONAA 59 (L) 12/16/2015 1030   GFRAA >60 02/18/2020 1306   GFRAA 68 12/16/2015 1030    No results found for: SPEP, UPEP  Lab Results  Component Value Date   WBC 10.6 (H) 02/18/2020   NEUTROABS 7.0 02/18/2020   HGB 10.6 (L) 02/18/2020   HCT 30.6 (L) 02/18/2020   MCV 90.5 02/18/2020   PLT 309 02/18/2020      Chemistry      Component Value Date/Time   NA 133 (L) 02/18/2020 1306   NA 137 10/27/2011 0738   K 4.2 02/18/2020 1306   K 4.1 10/27/2011 0738   CL 99 02/18/2020 1306   CL 105 10/27/2011 0738   CO2 28 02/18/2020 1306   CO2 26 10/27/2011 0738   BUN 9 02/18/2020 1306  BUN 15 10/27/2011 0738   CREATININE 0.83 02/18/2020 1306   CREATININE 1.01 (H) 12/16/2015 1030   GLU 98 09/28/2013 0000      Component Value Date/Time   CALCIUM 10.1 02/18/2020 1306   CALCIUM 10.0 09/28/2013 0000   ALKPHOS 94 02/18/2020 1306   ALKPHOS 102 10/27/2011 0738   AST 28 02/18/2020 1306   ALT 31 02/18/2020 1306   ALT 29 10/27/2011 0738   BILITOT 0.5 02/18/2020 1306       RADIOGRAPHIC STUDIES: I have reviewed multiple imaging studies with the patient and her husband I have personally reviewed the radiological images as listed and agreed with the findings in the report. CT ABDOMEN PELVIS W CONTRAST  Result Date: 02/21/2020 CLINICAL DATA:  Ovarian cancer restaging. EXAM: CT ABDOMEN AND PELVIS WITH CONTRAST TECHNIQUE: Multidetector CT imaging of the abdomen and pelvis was  performed using the standard protocol following bolus administration of intravenous contrast. CONTRAST:  155mL OMNIPAQUE IOHEXOL 300 MG/ML  SOLN COMPARISON:  Multiple exams, including 10/10/2019 FINDINGS: Lower chest: Stable calcified granulomas in both lower lobes. Small type 1 hiatal hernia. Hepatobiliary: Cholecystectomy. Common bile duct measures 0.7 cm in diameter, borderline prominent but likely a physiologic response to cholecystectomy. No intrahepatic biliary dilatation. Pancreas: Unremarkable Spleen: Punctate calcifications compatible with old granulomatous disease. Adrenals/Urinary Tract: Mild right hydronephrosis and hydroureter extending down to the level of the recurrent pelvic mass. Distal to the iliac vessel cross over the right ovary is obscured by the mass, probably from either extrinsic mass effect or ureteral invasion by the mass. Visualization of the distal ureter and parts of the urinary bladder also limited by the streak artifact from the right total hip prosthesis. No significant delayed nephrogram. The left kidney and adrenal glands appear normal. Stomach/Bowel: Sigmoid diverticulosis along with indistinctness of the margins of the distal sigmoid colon suggesting inflammation. Close association of the distal sigmoid colon and anterior rectum with the pelvic mass. Prominence of stool proximal to the sigmoid colon possibly from constipation or low-grade partial obstruction. No dilated small bowel. Vascular/Lymphatic: Aortoiliac atherosclerotic vascular disease. Right external iliac node 0.7 cm in short axis on image 58 series 2, previously 0.4 cm. Reproductive: Complex cystic mass versus less likely complex abscess along the vaginal cuff and extending anterior to the rectum and along the right lateral side of the rectosigmoid junction, with components of the process extending up towards the adnexa, right greater than left. No internal gas density. Involved region measures proximally 9.0 by 8.5  by 10.9 cm with complex internal elements. Other: Mildly increased presacral edema along with mild stranding tracking in the perirectal space and into the lower retroperitoneum. Musculoskeletal: Right total hip prosthesis. Lumbar spondylosis and degenerative disc disease causing impingement at L3-4, L4-5, and L5-S1. Mild rectus diastasis with transverse colon and adipose tissue extending along the diastatic region. IMPRESSION: 1. Complex cystic mass versus less likely complex abscess along the vaginal cuff and extending anterior to the rectum and along the right lateral side of the rectosigmoid junction, with components of the process extending up towards the adnexa, right greater than left. This measures up to 10.9 cm in diameter. If this represents an abscess I would expect the patient have striking symptoms (fever, pain, etc.). Given the patient's history, the appearance tends to favor recurrent ovarian malignancy over abscess. 2. Mild right hydronephrosis and hydroureter extending down to the level of the recurrent pelvic mass, indicating some low-grade obstruction. 3. There is sigmoid colon diverticulosis along with some stranding and wall  indistinctness of the sigmoid colon which could reflect diverticulitis or colitis. Assessment is made more complex by the proximity of the pelvic mass. 4. Prominence of stool proximal to the sigmoid colon possibly from constipation or low-grade partial obstruction. 5. Other imaging findings of potential clinical significance: Small type 1 hiatal hernia. Old granulomatous disease. Lumbar spondylosis and degenerative disc disease causing impingement at L3-4, L4-5, and L5-S1. Mild rectus diastasis with transverse colon and adipose tissue extending along the diastatic region. 6. Aortic atherosclerosis. Aortic Atherosclerosis (ICD10-I70.0). Electronically Signed   By: Van Clines M.D.   On: 02/21/2020 09:53   DG BONE DENSITY (DXA)  Result Date: 02/08/2020 EXAM: DUAL  X-RAY ABSORPTIOMETRY (DXA) FOR BONE MINERAL DENSITY IMPRESSION: Referring Physician:  Michael Boston Your patient completed a BMD test using Lunar IDXA DXA system ( analysis version: 16 ) manufactured by [employer]. Technologist: AW PATIENT: Name: Kaleea, Penner Patient ID: [id number] Birth Date: [birth year] Height: 66.0 in. Sex: Female Measured: 02/08/2020 Weight: 190.2 lbs. Indications: Bilateral Ovariectomy (65.51), Caucasian, Estrogen Deficient, Hysterectomy, Postmenopausal, Prozac, Right hip replacement Fractures: None Treatments: Vitamin D (E933.5) ASSESSMENT: The BMD measured at Forearm Radius 33% is 0.872 g/cm2 with a T-score of -0.2. This patient is considered normal according to [facility]) criteria. The scan quality is good. L-3 and L-4 were excluded due to degenerative changes.Right femur was excluded due to surgical hardware. Site Region Measured Date Measured Age YA BMD Significant CHANGE T-score Left Forearm Radius 33% 02/08/2020 67.9 -1.2 0.872 g/cm2 AP Spine L1-L2 02/08/2020 67.9 2.2 1.423 g/cm2 Left Femur Neck 02/08/2020 67.9 1.3 1.206 g/cm2 Left Femur Total 3/08/2020 67.9 1.5 1.182 g/cm2 World Health Organization [facility]) criteria for post-menopausal, Caucasian Women: Normal       T-score at or above -1 SD Osteopenia   T-score between -1 and -2.5 SD Osteoporosis T-score at or below -2.5 SD RECOMMENDATION: 1. All patients should optimize calcium and vitamin D intake. 2. Consider FDA approved medical therapies in postmenopausal women and men aged 11 years and older, based on the following: a. A hip or vertebral (clinical or morphometric) fracture b. T- score < or = -2.5 at the femoral neck or spine after appropriate evaluation to exclude secondary causes c. Low bone mass (T-score between -1.0 and -2.5 at the femoral neck or spine) and a 10 year probability of a hip fracture > or = 3% or a 10 year probability of a major osteoporosis-related fracture > or = 20% based on the  [place]-adapted WHO algorithm d. Clinician judgment and/or patient preferences may indicate treatment for people with 10-year fracture probabilities above or below these levels FOLLOW-UP: Patients with diagnosis of osteoporosis or at high risk for fracture should have regular bone mineral density tests. For patients eligible for Medicare, routine testing is allowed once every 2 years. The testing frequency can be increased to one year for patients who have rapidly progressing disease, those who are receiving or discontinuing medical therapy to restore bone mass, or have additional risk factors. I have reviewed this report and agree with the above findings. [facility] Radiology Electronically Signed   By: Lowella Grip III M.D.   On: 02/08/2020 14:20   DG Abd 2 Views  Result Date: 02/18/2020 CLINICAL DATA:  Abdominal pain, history of ovarian carcinoma EXAM: ABDOMEN - 2 VIEW COMPARISON:  10/10/2019 FINDINGS: Scattered large and small bowel gas is noted. No abnormal mass or abnormal calcifications are seen. Changes of prior right hip replacement and cholecystectomy are noted. Mild degenerative  changes of lumbar spine are seen. IMPRESSION: No acute abnormality noted. Electronically Signed   By: Inez Catalina M.D.   On: 02/18/2020 21:13

## 2020-02-22 NOTE — Assessment & Plan Note (Signed)
We have extensive discussions about management of constipation If she needs repeat colonoscopy, the patient would like to be referred to [facility] practice for repeat colonoscopy

## 2020-02-22 NOTE — Assessment & Plan Note (Signed)
I have reviewed imaging studies with the patient and her husband Unfortunately, the CT imaging is worrisome for either cancer recurrence of pelvic abscess Pelvic abscess is less likely given her lack of fever or chills although the patient did not measure her temperature at home Her prior colonoscopy in 2017 was unremarkable I plan to present her case at the GYN oncology tumor board on Monday for further discussion and review plan of care I have briefly discussed her imaging study with her GYN surgeon

## 2020-02-22 NOTE — Telephone Encounter (Signed)
Called referral for urgent referral and faxed patient information [phone]. Received confirmation.   The office will call back with appt information.

## 2020-02-22 NOTE — Assessment & Plan Note (Signed)
We have extensive discussion about pain management She has enough pain medicine until next week

## 2020-02-22 NOTE — Assessment & Plan Note (Signed)
She has complicated UTI secondary to hydronephrosis from pelvic mass compression She have flank pain, worrisome for early signs of pyelonephritis Given her recent urine culture, I plan to change her antibiotic to [facility] and will reassess next week

## 2020-02-22 NOTE — Assessment & Plan Note (Signed)
She is symptomatic with flank pain She had complicated UTI Thankfully, her recent renal function was stable I recommend referral to urology for consultation to see if ureteric stent is appropriate

## 2020-02-22 NOTE — Telephone Encounter (Signed)
[facility] called back and left a message. She has appt with Dr. Jeffie Pollock on Monday 10/11 at 2 pm. They are calling patient with appt details.

## 2020-02-25 ENCOUNTER — Other Ambulatory Visit (HOSPITAL_COMMUNITY): Payer: Self-pay | Admitting: Urology

## 2020-02-25 ENCOUNTER — Telehealth: Payer: Self-pay | Admitting: Oncology

## 2020-02-25 DIAGNOSIS — N3000 Acute cystitis without hematuria: Secondary | ICD-10-CM | POA: Diagnosis not present

## 2020-02-25 DIAGNOSIS — R102 Pelvic and perineal pain: Secondary | ICD-10-CM | POA: Diagnosis not present

## 2020-02-25 DIAGNOSIS — R1909 Other intra-abdominal and pelvic swelling, mass and lump: Secondary | ICD-10-CM | POA: Diagnosis not present

## 2020-02-25 DIAGNOSIS — N131 Hydronephrosis with ureteral stricture, not elsewhere classified: Secondary | ICD-10-CM | POA: Diagnosis not present

## 2020-02-25 NOTE — Telephone Encounter (Signed)
Connie Heath with appointment to see Dr. Alvy Bimler tomorrow at 1:00.  Advised her to arrive early and to bring her husband.  She verbalized agreement.  She also wanted to let Dr. Alvy Bimler know that she did have some nausea last week as well as chills (3-4 times a day but none this weekend).  She didn't have a fever but was taking Tylenol for pain and said she would feel hot and flushed when it wore off.  She did say that she was able to cut back a little on the pain medications this weekend.  She is now taking 1 tablet of Tylenol instead of 2 with the oxycodone.

## 2020-02-25 NOTE — Telephone Encounter (Signed)
Gynecologic Oncology Multi-Disciplinary Disposition Conference Note  Date of the Conference: 02/25/2020  Patient Name: Connie Heath  Primary GYN Oncologist: Dr. 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 Disposition is to favor recurrence and recommend palliative chemotherapy.   This Multidisciplinary conference took place involving physicians from [place], [place], Radiation Oncology, Pathology, Radiology along with the Gynecologic Oncology Nurse Practitioner and RN.  Comprehensive assessment of the patient's malignancy, staging, need for surgery, chemotherapy, radiation therapy, and need for further testing were reviewed. Supportive measures, both inpatient and following discharge were also discussed. The recommended plan of care is documented. Greater than 35 minutes were spent correlating and coordinating this patient's care.

## 2020-02-26 ENCOUNTER — Inpatient Hospital Stay: Payer: Medicare HMO

## 2020-02-26 ENCOUNTER — Encounter: Payer: Self-pay | Admitting: Oncology

## 2020-02-26 ENCOUNTER — Encounter: Payer: Self-pay | Admitting: Hematology and Oncology

## 2020-02-26 ENCOUNTER — Other Ambulatory Visit: Payer: Self-pay

## 2020-02-26 ENCOUNTER — Other Ambulatory Visit: Payer: Self-pay | Admitting: Hematology and Oncology

## 2020-02-26 ENCOUNTER — Inpatient Hospital Stay (HOSPITAL_BASED_OUTPATIENT_CLINIC_OR_DEPARTMENT_OTHER): Payer: Medicare HMO | Admitting: Hematology and Oncology

## 2020-02-26 VITALS — BP 137/69 | HR 108 | Temp 97.2°F | Resp 18 | Ht 67.0 in | Wt 187.2 lb

## 2020-02-26 DIAGNOSIS — N3000 Acute cystitis without hematuria: Secondary | ICD-10-CM | POA: Diagnosis not present

## 2020-02-26 DIAGNOSIS — Z7189 Other specified counseling: Secondary | ICD-10-CM

## 2020-02-26 DIAGNOSIS — C495 Malignant neoplasm of connective and soft tissue of pelvis: Secondary | ICD-10-CM | POA: Diagnosis not present

## 2020-02-26 DIAGNOSIS — G893 Neoplasm related pain (acute) (chronic): Secondary | ICD-10-CM | POA: Diagnosis not present

## 2020-02-26 DIAGNOSIS — N390 Urinary tract infection, site not specified: Secondary | ICD-10-CM | POA: Diagnosis not present

## 2020-02-26 DIAGNOSIS — R1032 Left lower quadrant pain: Secondary | ICD-10-CM | POA: Diagnosis not present

## 2020-02-26 DIAGNOSIS — Z23 Encounter for immunization: Secondary | ICD-10-CM | POA: Diagnosis not present

## 2020-02-26 DIAGNOSIS — Z5111 Encounter for antineoplastic chemotherapy: Secondary | ICD-10-CM | POA: Diagnosis not present

## 2020-02-26 DIAGNOSIS — D638 Anemia in other chronic diseases classified elsewhere: Secondary | ICD-10-CM | POA: Diagnosis not present

## 2020-02-26 DIAGNOSIS — Z299 Encounter for prophylactic measures, unspecified: Secondary | ICD-10-CM

## 2020-02-26 DIAGNOSIS — C561 Malignant neoplasm of right ovary: Secondary | ICD-10-CM

## 2020-02-26 DIAGNOSIS — C786 Secondary malignant neoplasm of retroperitoneum and peritoneum: Secondary | ICD-10-CM | POA: Diagnosis not present

## 2020-02-26 DIAGNOSIS — D539 Nutritional anemia, unspecified: Secondary | ICD-10-CM

## 2020-02-26 DIAGNOSIS — I700 Atherosclerosis of aorta: Secondary | ICD-10-CM | POA: Diagnosis not present

## 2020-02-26 DIAGNOSIS — N1330 Unspecified hydronephrosis: Secondary | ICD-10-CM | POA: Diagnosis not present

## 2020-02-26 DIAGNOSIS — K5909 Other constipation: Secondary | ICD-10-CM

## 2020-02-26 DIAGNOSIS — Z66 Do not resuscitate: Secondary | ICD-10-CM | POA: Diagnosis not present

## 2020-02-26 LAB — CMP (CANCER CENTER ONLY)
ALT: 20 U/L (ref 0–44)
AST: 20 U/L (ref 15–41)
Albumin: 3.6 g/dL (ref 3.5–5.0)
Alkaline Phosphatase: 106 U/L (ref 38–126)
Anion gap: 7 (ref 5–15)
BUN: 8 mg/dL (ref 8–23)
CO2: 30 mmol/L (ref 22–32)
Calcium: 10.4 mg/dL — ABNORMAL HIGH (ref 8.9–10.3)
Chloride: 95 mmol/L — ABNORMAL LOW (ref 98–111)
Creatinine: 0.96 mg/dL (ref 0.44–1.00)
GFR, Estimated: >60 mL/min (ref >60)
Glucose, Bld: 99 mg/dL (ref 70–99)
Potassium: 4.5 mmol/L (ref 3.5–5.1)
Sodium: 132 mmol/L — ABNORMAL LOW (ref 135–145)
Total Bilirubin: 0.3 mg/dL (ref 0.3–1.2)
Total Protein: 8.2 g/dL — ABNORMAL HIGH (ref 6.5–8.1)

## 2020-02-26 LAB — CBC WITH DIFFERENTIAL (CANCER CENTER ONLY)
Abs Immature Granulocytes: 0.05 10*3/uL (ref 0.00–0.07)
Basophils Absolute: 0 10*3/uL (ref 0.0–0.1)
Basophils Relative: 0 %
Eosinophils Absolute: 0.1 10*3/uL (ref 0.0–0.5)
Eosinophils Relative: 1 %
HCT: 32.2 % — ABNORMAL LOW (ref 36.0–46.0)
Hemoglobin: 11 g/dL — ABNORMAL LOW (ref 12.0–15.0)
Immature Granulocytes: 1 %
Lymphocytes Relative: 19 %
Lymphs Abs: 1.7 10*3/uL (ref 0.7–4.0)
MCH: 31.1 pg (ref 26.0–34.0)
MCHC: 34.2 g/dL (ref 30.0–36.0)
MCV: 91 fL (ref 80.0–100.0)
Monocytes Absolute: 0.9 10*3/uL (ref 0.1–1.0)
Monocytes Relative: 10 %
Neutro Abs: 6.3 10*3/uL (ref 1.7–7.7)
Neutrophils Relative %: 69 %
Platelet Count: 497 10*3/uL — ABNORMAL HIGH (ref 150–400)
RBC: 3.54 MIL/uL — ABNORMAL LOW (ref 3.87–5.11)
RDW: 13.2 % (ref 11.5–15.5)
WBC Count: 9.2 10*3/uL (ref 4.0–10.5)
nRBC: 0 % (ref 0.0–0.2)

## 2020-02-26 LAB — IRON AND TIBC
Iron: 26 ug/dL — ABNORMAL LOW (ref 41–142)
Saturation Ratios: 9 % — ABNORMAL LOW (ref 21–57)
TIBC: 289 ug/dL (ref 236–444)
UIBC: 262 ug/dL (ref 120–384)

## 2020-02-26 LAB — VITAMIN B12: Vitamin B-12: 361 pg/mL (ref 180–914)

## 2020-02-26 LAB — FERRITIN: Ferritin: 298 ng/mL (ref 11–307)

## 2020-02-26 LAB — SEDIMENTATION RATE: Sed Rate: 117 mm/hr — ABNORMAL HIGH (ref 0–22)

## 2020-02-26 MED ORDER — OXYCODONE HCL 10 MG PO TABS
10.0000 mg | ORAL_TABLET | ORAL | 0 refills | Status: DC | PRN
Start: 2020-02-26 — End: 2020-03-10

## 2020-02-26 MED ORDER — PNEUMOCOCCAL VAC POLYVALENT 25 MCG/0.5ML IJ INJ
0.5000 mL | INJECTION | Freq: Once | INTRAMUSCULAR | Status: AC
  Administered 2020-02-26: 0.5 mL via INTRAMUSCULAR
  Filled 2020-02-26: qty 0.5

## 2020-02-26 MED FILL — PROCHLORPERAZINE 10 MG TAB: 10 | 7 days supply | Qty: 30 | Fill #1

## 2020-02-26 MED FILL — oxyCODONE HCL 10 MG TABS: 10 | 10 days supply | Qty: 60 | Fill #0

## 2020-02-26 NOTE — Progress Notes (Signed)
Requested MSI testing on accession [id number] with [facility] via email.

## 2020-02-26 NOTE — Assessment & Plan Note (Signed)
She has multifactorial anemia, likely anemia chronic disease Just to be sure, I will order additional work-up including iron studies and vitamin B12 to see if we have other treatable causes She would be at risk of worsening anemia while on treatment When I see her back in 2 weeks, I will consider checking her blood closely and offer transfusion support if needed

## 2020-02-26 NOTE — Assessment & Plan Note (Signed)
Her pelvic pain is suboptimally controlled I recommend increasing the dose of oxycodone from 5 mg to 10 mg as needed She can take acetaminophen along with each dose of oxycodone She is aware about risk of side effects such as constipation while on treatment I will assess pain control when I see her back in 2 weeks

## 2020-02-26 NOTE — Assessment & Plan Note (Addendum)
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 I reviewed the current guidelines comparing the ovarian cancer regimen and sarcoma regimen I recommend we proceed with doxorubicin based chemotherapy as it is effective against her, as well as ovarian cancer, assuming she have mixed disease  We discussed the risk, benefits, side effects of single agent doxorubicin, including risk of pancytopenia, infection, mucositis, nausea, risk of congestive heart failure and others and she is willing to proceed We will get baseline blood work done today I will order baseline echocardiogram to be done before starting her on treatment and will repeat another echocardiogram every 3 months to observe for risk of congestive heart failure I will try to get her started on treatment as early as possible next week I will see her back within a week of treatment to assess toxicity and to manage her symptoms  I will also be ordering some molecular studies to see if she will qualify for other treatment such as pembrolizumab I will get pathologist to order MSI testing on her tissue sample If this is negative, we will proceed with PD-L1 testing and possibly [facility] One testing in the future She is in agreement

## 2020-02-26 NOTE — Assessment & Plan Note (Signed)
We have extensive discussions about goals of care She is aware that with recurrent disease, her cancer is considered noncurative I explained to the patient why surgery is not indicated We discussed the palliative nature of chemotherapy and she will likely need long-term treatment even if she achieved complete response to therapy She has advanced directives at home She does not want to be resuscitated

## 2020-02-26 NOTE — Progress Notes (Signed)
[facility] OFFICE PROGRESS NOTE  Patient Care Team: Connie Quitter, MD as PCP - General (Internal Medicine) Connie Fusi Servando Snare, RN as Oncology Nurse Navigator (Oncology)  ASSESSMENT & PLAN:  Ovarian cancer on right [facility]) 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 I reviewed the current guidelines comparing the ovarian cancer regimen and sarcoma regimen I recommend we proceed with doxorubicin based chemotherapy as it is effective against her, as well as ovarian cancer, assuming she have mixed disease  We discussed the risk, benefits, side effects of single agent doxorubicin, including risk of pancytopenia, infection, mucositis, nausea, risk of congestive heart failure and others and she is willing to proceed We will get baseline blood work done today I will order baseline echocardiogram to be done before starting her on treatment and will repeat another echocardiogram every 3 months to observe for risk of congestive heart failure I will try to get her started on treatment as early as possible next week I will see her back within a week of treatment to assess toxicity and to manage her symptoms  I will also be ordering some molecular studies to see if she will qualify for other treatment such as pembrolizumab I will get pathologist to order MSI testing on her tissue sample If this is negative, we will proceed with PD-L1 testing and possibly Foundation One testing in the future She is in agreement  Cancer associated pain Her pelvic pain is suboptimally controlled I recommend increasing the dose of oxycodone from 5  mg to 10 mg as needed She can take acetaminophen along with each dose of oxycodone She is aware about risk of side effects such as constipation while on treatment I will assess pain control when I see her back in 2 weeks  Anemia, chronic disease She has multifactorial anemia, likely anemia chronic disease Just to be sure, I will order additional work-up including iron studies and vitamin B12 to see if we have other treatable causes She would be at risk of worsening anemia while on treatment When I see her back in 2 weeks, I will consider checking her blood closely and offer transfusion support if needed  Hydronephrosis of right kidney She was evaluated by urologist yesterday I have discussed this case with him and reviewed his documentation Given her preserved renal function, she is recommended for close observation for now I agreed not to proceed with stent placement   Other constipation Her constipation is resolved with aggressive laxative therapy She will continue the same  UTI (urinary tract infection) Her flank pain has resolved with switching treatment to [facility] She will complete the course of antibiotics therapy  Goals of care, counseling/discussion We have extensive discussions about goals of care She is aware that with recurrent disease, her cancer is considered noncurative I explained to the patient why surgery is not indicated We discussed the palliative nature of chemotherapy and she will likely need long-term treatment even if she achieved complete response to therapy She has advanced directives at home She does not want to be resuscitated  Preventive measure We discussed the importance of preventive care and reviewed the vaccination programs. She does not have any prior allergic reactions  to pneumococcal vaccination. She agrees to proceed with pneumococcal 23 vaccination today and we will administer it today at the clinic. She is up-to-date with influenza  vaccination and Covid vaccination   Orders Placed This Encounter  Procedures  . Iron and TIBC    Standing Status:   Future    Number of Occurrences:   1    Standing Expiration Date:   02/25/2021  . Ferritin    Standing Status:   Future    Number of Occurrences:   1    Standing Expiration Date:   02/25/2021  . Vitamin B12    Standing Status:   Future    Number of Occurrences:   1    Standing Expiration Date:   02/25/2021  . Sedimentation rate    Standing Status:   Future    Number of Occurrences:   1    Standing Expiration Date:   02/25/2021  . CBC with Differential (Cancer Center Only)    Standing Status:   Standing    Number of Occurrences:   20    Standing Expiration Date:   02/25/2021  . CMP ([place] only)    Standing Status:   Standing    Number of Occurrences:   20    Standing Expiration Date:   02/25/2021  . PHYSICIAN COMMUNICATION ORDER    A baseline Echo/ [place] should be obtained prior to initiation of Anthracycline Chemotherapy  . ECHOCARDIOGRAM COMPLETE    Standing Status:   Future    Standing Expiration Date:   02/25/2021    Order Specific Question:   Where should this test be performed    Answer:   [place]    Order Specific Question:   Perflutren DEFINITY (image enhancing agent) should be administered unless hypersensitivity or allergy exist    Answer:   Administer Perflutren    Order Specific Question:   Reason for exam-Echo    Answer:   Chemotherapy evaluation  v87.41 / v58.11    All questions were answered. The patient knows to call the clinic with any problems, questions or concerns. The total time spent in the appointment was 80 minutes encounter with patients including review of chart and various tests results, discussions about plan of care and coordination of care plan   Connie Lark, MD 02/26/2020 2:14 PM  INTERVAL HISTORY: Please see below for problem oriented charting. She returns with her husband for further follow-up and review of plan  of care I was in touch with her urologist today I reviewed CT imaging again with the patient and husband and discussed whether we should perform biopsy or not We also reviewed the imaging study to discuss why surgery is not indicated She has worsening rectal pressure She is taking oxycodone with Tylenol approximately every 4-6 hours for pain control She is able to get regular bowel movement Her flank pain is better but she had one episode of chills yesterday Denies nausea She is tearful She shared with me that quality of life is important She does not want to share the bad news with her family yet  SUMMARY OF ONCOLOGIC HISTORY: Oncology History Overview Note  Neg genetics   Ovarian cancer on right ([place])  01/16/2019 Initial Diagnosis   She had vague symptom of dyspareunia. Presented to ER for evaluation in November for abdominal pain and vague symptom of fullness and imaging showed abnormalities   04/16/2019 Imaging   1. Nonvisualization of the appendix. However, there is a large volume of  ascites within the abdomen and pelvis which appears partially loculated. In the acute setting findings may be the sequelae of peritonitis. However, there are multiple partially calcified peritoneal nodules throughout the omentum which are concerning for peritoneal carcinomatosis. Noncalcified nodule in the left lower quadrant peritoneal reflection is noted. Further investigation with diagnostic paracentesis is advised. 2. Ill-defined low-density mass within the right iliac fossa is suspected and may represent an ovarian neoplasm. 3. Aortic atherosclerosis. 4. Small bilateral pleural effusions. 5. Hiatal hernia. 6. Prior granulomatous disease.   Aortic Atherosclerosis (ICD10-I70.0).   04/17/2019 Tumor Marker   Patient's tumor was tested for the following markers: CA-125 Results of the tumor marker test revealed 288.   04/18/2019 Procedure   Successful ultrasound-guided diagnostic and therapeutic  paracentesis yielding 3.2 liters of peritoneal fluid.   04/18/2019 Pathology Results   FINAL MICROSCOPIC DIAGNOSIS:  - Malignant cells present  - See comment   SPECIMEN ADEQUACY:  Satisfactory for evaluation   DIAGNOSTIC COMMENTS:  The cell block has scattered malignant cells.  These atypical cells are positive for cytokeraitn 7, MOC-31, Pax8, and WT-1 but negative for TTF-1 and cytokeratin 20.  Overall, the features are consistent with a primary gynecologic neoplasm   04/20/2019 Cancer Staging   Staging form: Ovary, Fallopian Tube, and Primary Peritoneal Carcinoma, AJCC 8th Edition - Clinical stage from 04/20/2019: FIGO Stage IIIC (cT3c, cN0, cM0) - Signed by Connie Lark, MD on 04/20/2019   04/25/2019 Procedure   Successful ultrasound-guided therapeutic paracentesis yielding 2.5 liters of peritoneal fluid.     04/27/2019 - 09/10/2019 Chemotherapy   The patient had carboplatin and taxol x 3 cycles for neoadjuvant chemotherapy treatment, followed by interval debulking surgery and 3 more cycles of chemotherapy     05/17/2019 Procedure   Placement of a subcutaneous port device. Catheter tip at the SVC and right atrium junction.   06/11/2019 Tumor Marker   Patient's tumor was tested for the following markers: CA-125 Results of the tumor marker test revealed 20.8   06/18/2019 Imaging   CT chest, abdomen and pelvis 1. Positive response to therapy. Complex 6.6 cm cystic right adnexal mass is decreased in size. Omental nodularity has largely resolved. Ascites has resolved. No adenopathy. 2. No evidence of metastatic disease in the chest. 3. Chronic findings include: Aortic Atherosclerosis (ICD10-I70.0). Small hiatal hernia. Mild sigmoid diverticulosis.   07/03/2019 Surgery   Preoperative Diagnosis: ovarian cancer stage IIIC     Procedure(s) Performed: Robotic-assisted laparoscopic bilateral salpingo-oophorectomy, omentectomy, radical tumor debulking with mini-laparotomy. Optimal cytoreduction  with no gross residual disease remaining.    Surgeon: Everitt Amber, M.D. Specimens: Bilateral ovaries, fallopian tubes, omentum    Indication for Procedure:  Patient has a history of stage IIIC ovarian cancer, s/p 3 cycles of neoadjuvant chemotherapy with good partial clinical response. Right ovarian mass on imaging and omental caking.   Operative Findings: 6-8cm right ovarian mass densely adherent to pelvic sidewall with filmy adhesions to colonic epiploica but not bowel wall. Normal appendix. Slightly enlarged left tube and ovary but without discrete mass. Omental nodularity throughout infracolic omentum. Tiny miliary <59mm nodules on left diaphragm.    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 Other Tissue/ Organ Involvement: Left ovary and omentum Largest Extrapelvic  Peritoneal Focus: 4.3 cm tumor deposit in the omentum Peritoneal/Ascitic Fluid: Positive for carcinoma ([id number]) Treatment Effect: Moderate response identified (CRS 2) Regional Lymph Nodes: No lymph nodes submitted or found Pathologic Stage Classification (pTNM, AJCC 8th Edition): pT3c, pNx Representative Tumor Block: B10 Comment(s): Only the serous component shows metastasis to the omentum and spread to left ovary and right fallopian tube   07/27/2019 Tumor Marker   Patient's tumor was tested for the following markers: CA-125 Results of the tumor marker test revealed 13.7   08/14/2019 Genetic Testing   Negative germline + somatic testing. No pathogenic variants identified on the [employer]. VUS  in MRE11A identified in the germline.  The report date is 08/14/2019.   The CancerNext gene panel offered by [employer] includes sequencing and rearrangement analysis for the following 36 genes: APC*, ATM*, AXIN2, BARD1, BMPR1A, BRCA1*, BRCA2*, BRIP1*, CDH1*, CDK4, CDKN2A, CHEK2*, DICER1, MLH1*, MSH2*, MSH3, MSH6*, MUTYH*, NBN, NF1*, NTHL1, PALB2*, PMS2*, PTEN*, RAD51C*, RAD51D*, RECQL, SMAD4, SMARCA4, STK11 and TP53* (sequencing and deletion/duplication); HOXB13, POLD1 and POLE (sequencing only); EPCAM and GREM1 (deletion/duplication only).   Somatic genes analyzed through TumorNext-HRD: ATM, BARD1, BRCA1, BRCA2, BRIP1, CHEK2, MRE11A, NBN, PALB2, RAD51C, RAD51D.     09/10/2019 Tumor Marker   Patient's tumor was tested for the following markers: CA-125 Results of the tumor marker test revealed 9.5   10/10/2019 Imaging   1. No findings suspicious for recurrent metastatic disease in the abdomen or pelvis. No adenopathy. Minimal smooth peritoneal thickening in the paracolic gutters and deep pelvis, nonspecific, with no discrete peritoneal implants. No ascites. Continued CT surveillance warranted. 2. Chronic findings include: Aortic Atherosclerosis (ICD10-I70.0). Small hiatal hernia. Minimal sigmoid diverticulosis.     10/10/2019 Tumor Marker   Patient's tumor was tested for the following markers: CA-125 Results of the tumor marker test revealed 8.9   02/18/2020 Tumor Marker   Patient's tumor was tested for the following markers: CA-125. Results of the tumor marker test revealed 10.9   02/18/2020 Imaging   No acute abnormality noted.     02/21/2020 Imaging   1. Complex cystic mass versus less likely complex abscess along the vaginal cuff and extending anterior to the rectum and along the right lateral side of the rectosigmoid junction, with components of the process extending up towards the adnexa, right greater than left. This measures up to 10.9 cm in diameter. If this represents an abscess  I would expect the patient have striking symptoms (fever, pain, etc.). Given the patient's history, the appearance tends to favor recurrent ovarian malignancy over abscess. 2. Mild right hydronephrosis and hydroureter extending down to the level of the recurrent pelvic mass, indicating some low-grade obstruction. 3. There is sigmoid colon diverticulosis along with some stranding and wall indistinctness of the sigmoid colon which could reflect diverticulitis or colitis. Assessment is made more complex by the proximity of the pelvic mass. 4. Prominence of stool proximal to the sigmoid colon possibly from constipation or low-grade partial obstruction. 5. Other imaging findings of potential clinical significance: Small type 1 hiatal hernia. Old granulomatous disease. Lumbar spondylosis and degenerative disc disease causing impingement at L3-4, L4-5, and L5-S1. Mild rectus diastasis with transverse colon and adipose tissue extending along the diastatic region. 6. Aortic atherosclerosis.   03/04/2020 -  Chemotherapy   The patient had DOXOrubicin (ADRIAMYCIN) chemo injection 150 mg, 75 mg/m2 = 150 mg, Intravenous,  Once, 0 of 4 cycles palonosetron (ALOXI) injection 0.25 mg, 0.25 mg, Intravenous,  Once, 0 of 4 cycles fosaprepitant (  EMEND) 150 mg in sodium chloride 0.9 % 145 mL IVPB, 150 mg, Intravenous,  Once, 0 of 4 cycles  for chemotherapy treatment.    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      REVIEW OF SYSTEMS:   Constitutional: Denies fevers, chills or abnormal  weight loss Eyes: Denies blurriness of vision Ears, nose, mouth, throat, and face: Denies mucositis or sore throat Respiratory: Denies cough, dyspnea or wheezes Cardiovascular: Denies palpitation, chest discomfort or lower extremity swelling Gastrointestinal:  Denies nausea, heartburn or change in bowel habits Skin: Denies abnormal skin rashes Lymphatics: Denies new lymphadenopathy or easy bruising Neurological:Denies numbness, tingling or new weaknesses Behavioral/Psych: Mood is stable, no new changes  All other systems were reviewed with the patient and are negative.  I have reviewed the past medical history, past surgical history, social history and family history with the patient and they are unchanged from previous note.  ALLERGIES:  is allergic to bee venom, amlodipine, demerol [meperidine hcl], lisinopril, losartan, and other.  MEDICATIONS:  Current Outpatient Medications  Medication Sig Dispense Refill  . ondansetron (ZOFRAN) 8 MG tablet Take 8 mg by mouth every 8 (eight) hours as needed.    . prochlorperazine (COMPAZINE) 10 MG tablet Take 10 mg by mouth every 6 (six) hours as needed.    . B Complex Vitamins (VITAMIN B COMPLEX PO) Take 1 tablet by mouth daily.    . cefdinir (OMNICEF) 300 MG capsule Take 1 capsule (300 mg total) by mouth 2 (two) times daily. 14 capsule 0  . Cholecalciferol (VITAMIN D-3 PO) Take 4,000 Units by mouth daily.     Marland Kitchen estradiol (ESTRACE VAGINAL) 0.1 MG/GM vaginal cream Place 1 Applicatorful vaginally 3 (three) times a week. (Patient taking differently: Place 1 Applicatorful vaginally as needed. ) 42.5 g 12  . FLUoxetine (PROZAC) 20 MG capsule Take 1 capsule (20 mg total) by mouth daily. 90 capsule 3  . lidocaine-prilocaine (EMLA) cream Apply to affected area once (Patient taking differently: Apply 1 application topically daily as needed (port access). ) 30 g 3  . loratadine (CLARITIN) 10 MG tablet Take 10 mg by mouth daily as needed for allergies.    Marland Kitchen  oxyCODONE 10 MG TABS Take 1 tablet (10 mg total) by mouth every 4 (four) hours as needed for severe pain. 60 tablet 0  . simvastatin (ZOCOR) 40 MG tablet Take 1 tablet (40 mg total) by mouth at bedtime. 90 tablet 4   No current facility-administered medications for this visit.    PHYSICAL EXAMINATION: ECOG PERFORMANCE STATUS: 1 - Symptomatic but completely ambulatory  Vitals:   02/26/20 1237  BP: 137/69  Pulse: (!) 108  Resp: 18  Temp: (!) 97.2 F (36.2 C)  SpO2: 100%   Filed Weights   02/26/20 1237  Weight: 187 lb 3.2 oz (84.9 kg)    GENERAL:alert, no distress and comfortable NEURO: alert & oriented x 3 with fluent speech, no focal motor/sensory deficits  LABORATORY DATA:  I have reviewed the data as listed    Component  Value Date/Time   NA 133 (L) 02/18/2020 1306   NA 137 10/27/2011 0738   K 4.2 02/18/2020 1306   K 4.1 10/27/2011 0738   CL 99 02/18/2020 1306   CL 105 10/27/2011 0738   CO2 28 02/18/2020 1306   CO2 26 10/27/2011 0738   GLUCOSE 88 02/18/2020 1306   GLUCOSE 111 (H) 10/27/2011 0738   BUN 9 02/18/2020 1306   BUN 15 10/27/2011 0738   CREATININE 0.83 02/18/2020 1306   CREATININE 1.01 (H) 12/16/2015 1030   CALCIUM 10.1 02/18/2020 1306   CALCIUM 10.0 09/28/2013 0000   PROT 7.4 02/18/2020 1306   PROT 8.1 10/27/2011 0738   ALBUMIN 3.3 (L) 02/18/2020 1306   ALBUMIN 3.9 10/27/2011 0738   AST 28 02/18/2020 1306   ALT 31 02/18/2020 1306   ALT 29 10/27/2011 0738   ALKPHOS 94 02/18/2020 1306   ALKPHOS 102 10/27/2011 0738   BILITOT 0.5 02/18/2020 1306   GFRNONAA >60 02/18/2020 1306   GFRNONAA 59 (L) 12/16/2015 1030   GFRAA >60 02/18/2020 1306   GFRAA 68 12/16/2015 1030    No results found for: SPEP, UPEP  Lab Results  Component Value Date   WBC 10.6 (H) 02/18/2020   NEUTROABS 7.0 02/18/2020   HGB 10.6 (L) 02/18/2020   HCT 30.6 (L) 02/18/2020   MCV 90.5 02/18/2020   PLT 309 02/18/2020      Chemistry      Component Value Date/Time   NA 133  (L) 02/18/2020 1306   NA 137 10/27/2011 0738   K 4.2 02/18/2020 1306   K 4.1 10/27/2011 0738   CL 99 02/18/2020 1306   CL 105 10/27/2011 0738   CO2 28 02/18/2020 1306   CO2 26 10/27/2011 0738   BUN 9 02/18/2020 1306   BUN 15 10/27/2011 0738   CREATININE 0.83 02/18/2020 1306   CREATININE 1.01 (H) 12/16/2015 1030   GLU 98 09/28/2013 0000      Component Value Date/Time   CALCIUM 10.1 02/18/2020 1306   CALCIUM 10.0 09/28/2013 0000   ALKPHOS 94 02/18/2020 1306   ALKPHOS 102 10/27/2011 0738   AST 28 02/18/2020 1306   ALT 31 02/18/2020 1306   ALT 29 10/27/2011 0738   BILITOT 0.5 02/18/2020 1306       RADIOGRAPHIC STUDIES: I have reviewed her CT imaging with the patient again I have personally reviewed the radiological images as listed and agreed with the findings in the report. CT ABDOMEN PELVIS W CONTRAST  Result Date: 02/21/2020 CLINICAL DATA:  Ovarian cancer restaging. EXAM: CT ABDOMEN AND PELVIS WITH CONTRAST TECHNIQUE: Multidetector CT imaging of the abdomen and pelvis was performed using the standard protocol following bolus administration of intravenous contrast. CONTRAST:  132mL OMNIPAQUE IOHEXOL 300 MG/ML  SOLN COMPARISON:  Multiple exams, including 10/10/2019 FINDINGS: Lower chest: Stable calcified granulomas in both lower lobes. Small type 1 hiatal hernia. Hepatobiliary: Cholecystectomy. Common bile duct measures 0.7 cm in diameter, borderline prominent but likely a physiologic response to cholecystectomy. No intrahepatic biliary dilatation. Pancreas: Unremarkable Spleen: Punctate calcifications compatible with old granulomatous disease. Adrenals/Urinary Tract: Mild right hydronephrosis and hydroureter extending down to the level of the recurrent pelvic mass. Distal to the iliac vessel cross over the right ovary is obscured by the mass, probably from either extrinsic mass effect or ureteral invasion by the mass. Visualization of the distal ureter and parts of the urinary bladder  also limited by the streak artifact from the right total hip prosthesis. No significant delayed nephrogram.  The left kidney and adrenal glands appear normal. Stomach/Bowel: Sigmoid diverticulosis along with indistinctness of the margins of the distal sigmoid colon suggesting inflammation. Close association of the distal sigmoid colon and anterior rectum with the pelvic mass. Prominence of stool proximal to the sigmoid colon possibly from constipation or low-grade partial obstruction. No dilated small bowel. Vascular/Lymphatic: Aortoiliac atherosclerotic vascular disease. Right external iliac node 0.7 cm in short axis on image 58 series 2, previously 0.4 cm. Reproductive: Complex cystic mass versus less likely complex abscess along the vaginal cuff and extending anterior to the rectum and along the right lateral side of the rectosigmoid junction, with components of the process extending up towards the adnexa, right greater than left. No internal gas density. Involved region measures proximally 9.0 by 8.5 by 10.9 cm with complex internal elements. Other: Mildly increased presacral edema along with mild stranding tracking in the perirectal space and into the lower retroperitoneum. Musculoskeletal: Right total hip prosthesis. Lumbar spondylosis and degenerative disc disease causing impingement at L3-4, L4-5, and L5-S1. Mild rectus diastasis with transverse colon and adipose tissue extending along the diastatic region. IMPRESSION: 1. Complex cystic mass versus less likely complex abscess along the vaginal cuff and extending anterior to the rectum and along the right lateral side of the rectosigmoid junction, with components of the process extending up towards the adnexa, right greater than left. This measures up to 10.9 cm in diameter. If this represents an abscess I would expect the patient have striking symptoms (fever, pain, etc.). Given the patient's history, the appearance tends to favor recurrent ovarian malignancy  over abscess. 2. Mild right hydronephrosis and hydroureter extending down to the level of the recurrent pelvic mass, indicating some low-grade obstruction. 3. There is sigmoid colon diverticulosis along with some stranding and wall indistinctness of the sigmoid colon which could reflect diverticulitis or colitis. Assessment is made more complex by the proximity of the pelvic mass. 4. Prominence of stool proximal to the sigmoid colon possibly from constipation or low-grade partial obstruction. 5. Other imaging findings of potential clinical significance: Small type 1 hiatal hernia. Old granulomatous disease. Lumbar spondylosis and degenerative disc disease causing impingement at L3-4, L4-5, and L5-S1. Mild rectus diastasis with transverse colon and adipose tissue extending along the diastatic region. 6. Aortic atherosclerosis. Aortic Atherosclerosis (ICD10-I70.0). Electronically Signed   By: Van Clines M.D.   On: 02/21/2020 09:53   DG BONE DENSITY (DXA)  Result Date: 02/08/2020 EXAM: DUAL X-RAY ABSORPTIOMETRY (DXA) FOR BONE MINERAL DENSITY IMPRESSION: Referring Physician:  Michael Boston Your patient completed a BMD test using Lunar IDXA DXA system ( analysis version: 16 ) manufactured by [employer]. Technologist: AW PATIENT: Name: Connie, Connie Patient ID: [id number] Birth Date: 30-May-1950 Height: 66.0 in. Sex: Female Measured: 02/08/2020 Weight: 190.2 lbs. Indications: Bilateral Ovariectomy (65.51), Caucasian, Estrogen Deficient, Hysterectomy, Postmenopausal, Prozac, Right hip replacement Fractures: None Treatments: Vitamin D (E933.5) ASSESSMENT: The BMD measured at Forearm Radius 33% is 0.872 g/cm2 with a T-score of -0.2. This patient is considered normal according to [facility]) criteria. The scan quality is good. L-3 and L-4 were excluded due to degenerative changes.Right femur was excluded due to surgical hardware. Site Region Measured Date Measured Age YA BMD Significant  CHANGE T-score Left Forearm Radius 33% 02/08/[age] 67.9 -1.2 0.872 g/cm2 AP Spine L1-L2 02/08/[age] 67.9 2.2 1.423 g/cm2 Left Femur Neck 02/08/2020 67.9 1.3 1.206 g/cm2 Left Femur Total 02/08/2020 67.9 1.4 1.182 g/cm2 World Health Organization [facility]) criteria for post-menopausal, Caucasian Women: Normal  T-score at or above -1 SD Osteopenia   T-score between -1 and -2.5 SD Osteoporosis T-score at or below -2.5 SD RECOMMENDATION: 1. All patients should optimize calcium and vitamin D intake. 2. Consider FDA approved medical therapies in postmenopausal women and men aged 69 years and older, based on the following: a. A hip or vertebral (clinical or morphometric) fracture b. T- score < or = -2.5 at the femoral neck or spine after appropriate evaluation to exclude secondary causes c. Low bone mass (T-score between -1.0 and -2.5 at the femoral neck or spine) and a 10 year probability of a hip fracture > or = 3% or a 10 year probability of a major osteoporosis-related fracture > or = 20% based on the [place]-adapted WHO algorithm d. Clinician judgment and/or patient preferences may indicate treatment for people with 10-year fracture probabilities above or below these levels FOLLOW-UP: Patients with diagnosis of osteoporosis or at high risk for fracture should have regular bone mineral density tests. For patients eligible for Medicare, routine testing is allowed once every 2 years. The testing frequency can be increased to one year for patients who have rapidly progressing disease, those who are receiving or discontinuing medical therapy to restore bone mass, or have additional risk factors. I have reviewed this report and agree with the above findings. [facility] Radiology Electronically Signed   By: Lowella Grip III M.D.   On: 02/08/2020 14:20   DG Abd 2 Views  Result Date: 02/18/2020 CLINICAL DATA:  Abdominal pain, history of ovarian carcinoma EXAM: ABDOMEN - 2 VIEW COMPARISON:  10/10/2019 FINDINGS: Scattered large  and small bowel gas is noted. No abnormal mass or abnormal calcifications are seen. Changes of prior right hip replacement and cholecystectomy are noted. Mild degenerative changes of lumbar spine are seen. IMPRESSION: No acute abnormality noted. Electronically Signed   By: Inez Catalina M.D.   On: 02/18/2020 21:13

## 2020-02-26 NOTE — Assessment & Plan Note (Signed)
Her constipation is resolved with aggressive laxative therapy She will continue the same

## 2020-02-26 NOTE — Assessment & Plan Note (Signed)
She was evaluated by urologist yesterday I have discussed this case with him and reviewed his documentation Given her preserved renal function, she is recommended for close observation for now I agreed not to proceed with stent placement

## 2020-02-26 NOTE — Assessment & Plan Note (Signed)
Her flank pain has resolved with switching treatment to [facility] She will complete the course of antibiotics therapy

## 2020-02-26 NOTE — Progress Notes (Signed)
DISCONTINUE ON PATHWAY REGIMEN - Ovarian     A cycle is every 21 days:     Paclitaxel      Carboplatin   **Always confirm dose/schedule in your pharmacy ordering system**  REASON: Disease Progression PRIOR TREATMENT: OVOS44: Carboplatin AUC=6 + Paclitaxel 175 mg/m2 q21 Days x 2-4 Cycles TREATMENT RESPONSE: Progressive Disease (PD)  START ON PATHWAY REGIMEN - Ovarian     A cycle is every 28 days:     Liposomal doxorubicin   **Always confirm dose/schedule in your pharmacy ordering system**  Patient Characteristics: Recurrent or Progressive Disease, Second Line, Platinum Resistant or < 6 Months Since Last Therapy BRCA Mutation Status: Absent Therapeutic Status: Recurrent or Progressive Disease Line of Therapy: Second Line  Intent of Therapy: Non-Curative / Palliative Intent, Discussed with Patient 

## 2020-02-26 NOTE — Assessment & Plan Note (Signed)
We discussed the importance of preventive care and reviewed the vaccination programs. She does not have any prior allergic reactions to pneumococcal vaccination. She agrees to proceed with pneumococcal 23 vaccination today and we will administer it today at the clinic. She is up-to-date with influenza vaccination and Covid vaccination

## 2020-02-27 ENCOUNTER — Ambulatory Visit (HOSPITAL_COMMUNITY)
Admission: RE | Admit: 2020-02-27 | Discharge: 2020-02-27 | Disposition: A | Discharge status: Home / Self Care | Payer: Medicare HMO | Source: Ambulatory Visit | Attending: Hematology and Oncology | Admitting: Hematology and Oncology

## 2020-02-27 ENCOUNTER — Encounter: Payer: Self-pay | Admitting: Hematology and Oncology

## 2020-02-27 ENCOUNTER — Telehealth: Payer: Self-pay | Admitting: *Deleted

## 2020-02-27 DIAGNOSIS — Z0189 Encounter for other specified special examinations: Secondary | ICD-10-CM | POA: Diagnosis not present

## 2020-02-27 DIAGNOSIS — C561 Malignant neoplasm of right ovary: Secondary | ICD-10-CM | POA: Diagnosis not present

## 2020-02-27 DIAGNOSIS — D539 Nutritional anemia, unspecified: Secondary | ICD-10-CM | POA: Diagnosis not present

## 2020-02-27 DIAGNOSIS — Z7189 Other specified counseling: Secondary | ICD-10-CM

## 2020-02-27 DIAGNOSIS — Z5111 Encounter for antineoplastic chemotherapy: Secondary | ICD-10-CM

## 2020-02-27 DIAGNOSIS — I082 Rheumatic disorders of both aortic and tricuspid valves: Secondary | ICD-10-CM | POA: Insufficient documentation

## 2020-02-27 LAB — ECHOCARDIOGRAM COMPLETE
Area-P 1/2: 3.17 cm2
S' Lateral: 2.5 cm

## 2020-02-27 NOTE — Telephone Encounter (Signed)
Per Dr.Gorsuch, called pt with message below. Pt verbalized understanding 

## 2020-02-27 NOTE — Progress Notes (Signed)
Echocardiogram 2D Echocardiogram has been performed.  Oneal Deputy Ayinde Swim 02/27/2020, 10:50 AM

## 2020-02-27 NOTE — Telephone Encounter (Signed)
-----   Message from [name], MD sent at 02/27/2020  9:44 AM EDT ----- Regarding: labs yesrterday Pls let her know B12 level is ok Her anemia is from her cancer, not nutritional deficiency I replied to her mychart msg

## 2020-02-28 MED FILL — CEFDINIR 300 MG CAPSULE: 300 | 7 days supply | Qty: 14 | Fill #0

## 2020-03-02 ENCOUNTER — Emergency Department (HOSPITAL_COMMUNITY): Payer: Medicare HMO

## 2020-03-02 ENCOUNTER — Other Ambulatory Visit: Payer: Self-pay

## 2020-03-02 ENCOUNTER — Inpatient Hospital Stay (HOSPITAL_COMMUNITY)
Admission: EM | Admit: 2020-03-02 | Discharge: 2020-03-06 | DRG: 749 | Disposition: A | Discharge status: Home Health | Payer: Medicare HMO | Attending: Gynecologic Oncology | Admitting: Gynecologic Oncology

## 2020-03-02 ENCOUNTER — Encounter (HOSPITAL_COMMUNITY): Payer: Self-pay

## 2020-03-02 DIAGNOSIS — C569 Malignant neoplasm of unspecified ovary: Secondary | ICD-10-CM | POA: Diagnosis not present

## 2020-03-02 DIAGNOSIS — N131 Hydronephrosis with ureteral stricture, not elsewhere classified: Secondary | ICD-10-CM | POA: Diagnosis present

## 2020-03-02 DIAGNOSIS — Z79899 Other long term (current) drug therapy: Secondary | ICD-10-CM

## 2020-03-02 DIAGNOSIS — Z800 Family history of malignant neoplasm of digestive organs: Secondary | ICD-10-CM

## 2020-03-02 DIAGNOSIS — Z66 Do not resuscitate: Secondary | ICD-10-CM | POA: Diagnosis present

## 2020-03-02 DIAGNOSIS — E785 Hyperlipidemia, unspecified: Secondary | ICD-10-CM | POA: Diagnosis present

## 2020-03-02 DIAGNOSIS — Z20822 Contact with and (suspected) exposure to covid-19: Secondary | ICD-10-CM | POA: Diagnosis not present

## 2020-03-02 DIAGNOSIS — D638 Anemia in other chronic diseases classified elsewhere: Secondary | ICD-10-CM | POA: Diagnosis present

## 2020-03-02 DIAGNOSIS — Z90711 Acquired absence of uterus with remaining cervical stump: Secondary | ICD-10-CM

## 2020-03-02 DIAGNOSIS — J45909 Unspecified asthma, uncomplicated: Secondary | ICD-10-CM | POA: Diagnosis present

## 2020-03-02 DIAGNOSIS — Z8249 Family history of ischemic heart disease and other diseases of the circulatory system: Secondary | ICD-10-CM

## 2020-03-02 DIAGNOSIS — E871 Hypo-osmolality and hyponatremia: Secondary | ICD-10-CM | POA: Diagnosis present

## 2020-03-02 DIAGNOSIS — F32A Depression, unspecified: Secondary | ICD-10-CM | POA: Diagnosis not present

## 2020-03-02 DIAGNOSIS — Z95828 Presence of other vascular implants and grafts: Secondary | ICD-10-CM | POA: Diagnosis not present

## 2020-03-02 DIAGNOSIS — K432 Incisional hernia without obstruction or gangrene: Secondary | ICD-10-CM | POA: Diagnosis not present

## 2020-03-02 DIAGNOSIS — K469 Unspecified abdominal hernia without obstruction or gangrene: Secondary | ICD-10-CM | POA: Diagnosis not present

## 2020-03-02 DIAGNOSIS — R109 Unspecified abdominal pain: Secondary | ICD-10-CM | POA: Diagnosis not present

## 2020-03-02 DIAGNOSIS — Z888 Allergy status to other drugs, medicaments and biological substances status: Secondary | ICD-10-CM | POA: Diagnosis not present

## 2020-03-02 DIAGNOSIS — N736 Female pelvic peritoneal adhesions (postinfective): Secondary | ICD-10-CM | POA: Diagnosis present

## 2020-03-02 DIAGNOSIS — C561 Malignant neoplasm of right ovary: Secondary | ICD-10-CM | POA: Diagnosis present

## 2020-03-02 DIAGNOSIS — I10 Essential (primary) hypertension: Secondary | ICD-10-CM | POA: Diagnosis present

## 2020-03-02 DIAGNOSIS — K56609 Unspecified intestinal obstruction, unspecified as to partial versus complete obstruction: Secondary | ICD-10-CM

## 2020-03-02 DIAGNOSIS — Z885 Allergy status to narcotic agent status: Secondary | ICD-10-CM | POA: Diagnosis not present

## 2020-03-02 DIAGNOSIS — G893 Neoplasm related pain (acute) (chronic): Secondary | ICD-10-CM | POA: Diagnosis not present

## 2020-03-02 DIAGNOSIS — Z801 Family history of malignant neoplasm of trachea, bronchus and lung: Secondary | ICD-10-CM | POA: Diagnosis not present

## 2020-03-02 DIAGNOSIS — K5900 Constipation, unspecified: Secondary | ICD-10-CM

## 2020-03-02 DIAGNOSIS — F419 Anxiety disorder, unspecified: Secondary | ICD-10-CM | POA: Diagnosis present

## 2020-03-02 DIAGNOSIS — C763 Malignant neoplasm of pelvis: Secondary | ICD-10-CM | POA: Diagnosis not present

## 2020-03-02 DIAGNOSIS — N1330 Unspecified hydronephrosis: Secondary | ICD-10-CM | POA: Diagnosis present

## 2020-03-02 DIAGNOSIS — Z9049 Acquired absence of other specified parts of digestive tract: Secondary | ICD-10-CM

## 2020-03-02 DIAGNOSIS — R1900 Intra-abdominal and pelvic swelling, mass and lump, unspecified site: Secondary | ICD-10-CM

## 2020-03-02 DIAGNOSIS — D630 Anemia in neoplastic disease: Secondary | ICD-10-CM | POA: Diagnosis present

## 2020-03-02 DIAGNOSIS — Z9221 Personal history of antineoplastic chemotherapy: Secondary | ICD-10-CM

## 2020-03-02 DIAGNOSIS — K5669 Other partial intestinal obstruction: Secondary | ICD-10-CM | POA: Diagnosis not present

## 2020-03-02 LAB — COMPREHENSIVE METABOLIC PANEL
ALT: 15 U/L (ref 0–44)
AST: 23 U/L (ref 15–41)
Albumin: 3.8 g/dL (ref 3.5–5.0)
Alkaline Phosphatase: 71 U/L (ref 38–126)
Anion gap: 15 (ref 5–15)
BUN: 8 mg/dL (ref 8–23)
CO2: 23 mmol/L (ref 22–32)
Calcium: 9.5 mg/dL (ref 8.9–10.3)
Chloride: 93 mmol/L — ABNORMAL LOW (ref 98–111)
Creatinine, Ser: 0.82 mg/dL (ref 0.44–1.00)
GFR, Estimated: >60 mL/min (ref >60)
Glucose, Bld: 107 mg/dL — ABNORMAL HIGH (ref 70–99)
Potassium: 3.7 mmol/L (ref 3.5–5.1)
Sodium: 131 mmol/L — ABNORMAL LOW (ref 135–145)
Total Bilirubin: 0.6 mg/dL (ref 0.3–1.2)
Total Protein: 7.7 g/dL (ref 6.5–8.1)

## 2020-03-02 LAB — CBC WITH DIFFERENTIAL/PLATELET
Abs Immature Granulocytes: 0.06 10*3/uL (ref 0.00–0.07)
Basophils Absolute: 0.1 10*3/uL (ref 0.0–0.1)
Basophils Relative: 1 %
Eosinophils Absolute: 0.1 10*3/uL (ref 0.0–0.5)
Eosinophils Relative: 1 %
HCT: 31.5 % — ABNORMAL LOW (ref 36.0–46.0)
Hemoglobin: 10.5 g/dL — ABNORMAL LOW (ref 12.0–15.0)
Immature Granulocytes: 1 %
Lymphocytes Relative: 18 %
Lymphs Abs: 1.8 10*3/uL (ref 0.7–4.0)
MCH: 30.7 pg (ref 26.0–34.0)
MCHC: 33.3 g/dL (ref 30.0–36.0)
MCV: 92.1 fL (ref 80.0–100.0)
Monocytes Absolute: 0.8 10*3/uL (ref 0.1–1.0)
Monocytes Relative: 8 %
Neutro Abs: 6.8 10*3/uL (ref 1.7–7.7)
Neutrophils Relative %: 71 %
Platelets: 572 10*3/uL — ABNORMAL HIGH (ref 150–400)
RBC: 3.42 MIL/uL — ABNORMAL LOW (ref 3.87–5.11)
RDW: 13.5 % (ref 11.5–15.5)
WBC: 9.6 10*3/uL (ref 4.0–10.5)
nRBC: 0 % (ref 0.0–0.2)

## 2020-03-02 LAB — URINALYSIS, ROUTINE W REFLEX MICROSCOPIC
Bilirubin Urine: NEGATIVE
Glucose, UA: NEGATIVE mg/dL
Hgb urine dipstick: NEGATIVE
Ketones, ur: NEGATIVE mg/dL
Leukocytes,Ua: NEGATIVE
Nitrite: NEGATIVE
Protein, ur: NEGATIVE mg/dL
Specific Gravity, Urine: 1.009 (ref 1.005–1.030)
pH: 6 (ref 5.0–8.0)

## 2020-03-02 LAB — RESPIRATORY PANEL BY RT PCR (FLU A&B, COVID)
Influenza A by PCR: NEGATIVE
Influenza B by PCR: NEGATIVE
SARS Coronavirus 2 by RT PCR: NEGATIVE

## 2020-03-02 LAB — LIPASE, BLOOD: Lipase: 19 U/L (ref 11–51)

## 2020-03-02 MED ORDER — HYDROMORPHONE HCL 1 MG/ML IJ SOLN
0.5000 mg | INTRAMUSCULAR | Status: DC | PRN
  Administered 2020-03-02 – 2020-03-03 (×2): 0.5 mg via INTRAVENOUS
  Filled 2020-03-02 (×2): qty 1

## 2020-03-02 MED ORDER — HYDROCODONE-ACETAMINOPHEN 5-325 MG PO TABS
1.0000 | ORAL_TABLET | ORAL | Status: DC | PRN
  Administered 2020-03-02 (×2): 2 via ORAL
  Filled 2020-03-02 (×4): qty 2

## 2020-03-02 MED ORDER — TRIAMCINOLONE ACETONIDE 0.1 % EX CREA
TOPICAL_CREAM | Freq: Two times a day (BID) | CUTANEOUS | Status: DC
  Filled 2020-03-02 (×2): qty 15

## 2020-03-02 MED ORDER — ENOXAPARIN SODIUM 40 MG/0.4ML ~~LOC~~ SOLN
40.0000 mg | SUBCUTANEOUS | Status: DC
  Administered 2020-03-02 – 2020-03-03 (×2): 40 mg via SUBCUTANEOUS
  Filled 2020-03-02 (×2): qty 0.4

## 2020-03-02 MED ORDER — ONDANSETRON HCL 4 MG/2ML IJ SOLN
4.0000 mg | Freq: Once | INTRAMUSCULAR | Status: AC
  Administered 2020-03-02: 4 mg via INTRAVENOUS
  Filled 2020-03-02: qty 2

## 2020-03-02 MED ORDER — SODIUM CHLORIDE 0.9 % IV BOLUS
1000.0000 mL | Freq: Once | INTRAVENOUS | Status: AC
  Administered 2020-03-02: 1000 mL via INTRAVENOUS

## 2020-03-02 MED ORDER — ONDANSETRON HCL 4 MG PO TABS: 4.0000 mg | ORAL_TABLET | Freq: Four times a day (QID) | ORAL | Status: DC | PRN

## 2020-03-02 MED ORDER — IOHEXOL 300 MG/ML  SOLN
100.0000 mL | Freq: Once | INTRAMUSCULAR | Status: AC | PRN
  Administered 2020-03-02: 100 mL via INTRAVENOUS

## 2020-03-02 MED ORDER — DIPHENHYDRAMINE-ZINC ACETATE 2-0.1 % EX CREA
TOPICAL_CREAM | Freq: Two times a day (BID) | CUTANEOUS | Status: DC | PRN
  Filled 2020-03-02: qty 28

## 2020-03-02 MED ORDER — ALPRAZOLAM 0.25 MG PO TABS: 0.2500 mg | ORAL_TABLET | Freq: Every day | ORAL | Status: DC | PRN

## 2020-03-02 MED ORDER — FLUOXETINE HCL 20 MG PO CAPS
20.0000 mg | ORAL_CAPSULE | Freq: Every day | ORAL | Status: DC
  Administered 2020-03-02 – 2020-03-03 (×2): 20 mg via ORAL
  Filled 2020-03-02 (×3): qty 1

## 2020-03-02 MED ORDER — HYDROMORPHONE HCL 1 MG/ML IJ SOLN
1.0000 mg | Freq: Once | INTRAMUSCULAR | Status: AC
  Administered 2020-03-02: 1 mg via INTRAVENOUS
  Filled 2020-03-02: qty 1

## 2020-03-02 MED ORDER — SIMVASTATIN 20 MG PO TABS
40.0000 mg | ORAL_TABLET | Freq: Every day | ORAL | Status: DC
  Administered 2020-03-02 – 2020-03-03 (×2): 40 mg via ORAL
  Filled 2020-03-02 (×2): qty 2

## 2020-03-02 MED ORDER — ONDANSETRON HCL 4 MG/2ML IJ SOLN
4.0000 mg | Freq: Four times a day (QID) | INTRAMUSCULAR | Status: DC | PRN
  Administered 2020-03-02 – 2020-03-04 (×3): 4 mg via INTRAVENOUS
  Filled 2020-03-02 (×3): qty 2

## 2020-03-02 MED ORDER — HYDROMORPHONE HCL 1 MG/ML IJ SOLN
0.5000 mg | Freq: Once | INTRAMUSCULAR | Status: AC
  Administered 2020-03-02: 0.5 mg via INTRAVENOUS
  Filled 2020-03-02: qty 1

## 2020-03-02 MED ORDER — ACETAMINOPHEN 650 MG RE SUPP: 650.0000 mg | Freq: Four times a day (QID) | RECTAL | Status: DC | PRN

## 2020-03-02 MED ORDER — SODIUM CHLORIDE (PF) 0.9 % IJ SOLN
INTRAMUSCULAR | Status: AC
  Filled 2020-03-02: qty 50

## 2020-03-02 MED ORDER — POLYETHYLENE GLYCOL 3350 17 G PO PACK
17.0000 g | PACK | Freq: Two times a day (BID) | ORAL | Status: DC
  Administered 2020-03-03: 17 g via ORAL
  Filled 2020-03-02 (×3): qty 1

## 2020-03-02 MED ORDER — ACETAMINOPHEN 325 MG PO TABS: 650.0000 mg | ORAL_TABLET | Freq: Four times a day (QID) | ORAL | Status: DC | PRN

## 2020-03-02 NOTE — ED Provider Notes (Signed)
[facility] DEPT Provider Note   CSN: [id number] Arrival date & time: 03/02/20  0947     History Chief Complaint  Patient presents with  . Abdominal Pain  . Constipation    DESTYNEE STRINGFELLOW is a 69 y.o. female with pertinent past medical history of ovarian cancer diagnosed last year followed by Dr. Alvy Bimler s/p chemotherapy that ended this year in April, depression, hyperlipidemia, hypertension that presents the emergency department today for right lower quadrant abdominal pain and constipation.  Patient states that she was successful with chemotherapy in April, however last week started having symptoms again in relation to her ovarian cancer, saw Dr. Alvy Bimler who diagnosed her with recurrence of ovarian cancer, has not started chemotherapy yet.  Patient states that she is having a cramping sensation on her right lower side which is different than her normal pain she has there from her cancer.  States that the pain is severe, does not radiate anywhere and is constant.  This started 3 days ago.  Has also not had a bowel movement for the past 3 days.  States that she is passing small amounts of gas, this is abnormal for her.  Also admits to nausea that started 3 days ago, no vomiting.  Denies any fevers, chills, dysuria, hematuria, vaginal pain, vaginal discharge or bleeding.  No chest pain or back pain.  Has not taken anything for this yet.  Has decreased appetite due to nausea.  Patient has had hysterectomy and cholecystectomy, still has appendix.  HPI     Past Medical History:  Diagnosis Date  . #[id number] dx'd 03/2019   ovarian cancer   . Depression   . Dyspnea    with increased exertion   . Family history of lung cancer   . Family history of stomach cancer   . Fatigue   . GERD (gastroesophageal reflux disease)   . Hyperlipidemia    H/o myalgias with Vytorin but tolerating simvastatin  . Hypertension   . Myalgia    Hx of   . S/P abdominal paracentesis  04/25/2019   pulled off 2.5 liters  . S/P cholecystectomy   . S/P hysterectomy   . Shingles     Patient Active Problem List   Diagnosis Date Noted  . Cancer related pain 03/02/2020  . Encounter for antineoplastic chemotherapy 02/26/2020  . Goals of care, counseling/discussion 02/26/2020  . Deficiency anemia 02/26/2020  . 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 Preventive measure 02/26/2020  . Hydronephrosis of right kidney 02/22/2020  . Dysuria 02/19/2020  . Other constipation 02/19/2020  . Anemia, chronic disease 02/19/2020  . UTI (urinary tract infection) 02/19/2020  . Abdominal pain 02/18/2020  . Anemia due to antineoplastic chemotherapy 10/11/2019  . Atrophic vaginitis 08/17/2019  . Genetic testing 08/14/2019  . Peripheral neuropathy due to and not concurrent with chemotherapy ([place]) 05/21/2019  . Family history of stomach cancer   . Family history of lung cancer   . GERD (gastroesophageal reflux disease) 05/01/2019  . Reactive thrombocytosis 04/20/2019  . Leukocytosis 04/20/2019  . Cancer associated pain 04/20/2019  . Ovarian cancer on right ([place]) 04/18/2019  . Hyperparathyroidism ([place]) 10/22/2014  . Extrinsic asthma 09/26/2014  . Fibromyalgia 09/26/2014  . Poor concentration 08/08/2014  . Family history of colon cancer 08/08/2014  . Insomnia 08/08/2014  . Prediabetes 08/08/2014  . Essential hypertension 07/19/2014  . Hyperlipidemia 07/19/2014  . Hypercalcemia 07/19/2014  . Depression 07/19/2014  . ABNORMAL EKG 12/04/2008    Past Surgical  History:  Procedure Laterality Date  . DIAGNOSTIC LAPAROSCOPY    . IR IMAGING GUIDED PORT INSERTION  05/17/2019  . JOINT REPLACEMENT     right total hip   . LAPAROSCOPIC CHOLECYSTECTOMY    . PARACENTESIS    . PARATHYROIDECTOMY Right 2013  . PARTIAL HYSTERECTOMY    . TONSILLECTOMY AND ADENOIDECTOMY       OB History   No obstetric history on file.     Family History  Problem Relation Age of Onset  . Lung  cancer Mother   . Cancer Father 84       Stomach  . Heart failure Maternal Grandmother 80       CHF  . Heart failure Maternal Grandfather 50       Acute MI  . Cancer Paternal Aunt        either colon or liver    Social History   Tobacco Use  . Smoking status: Never Smoker  . Smokeless tobacco: Never Used  Vaping Use  . Vaping Use: Never used  Substance Use Topics  . Alcohol use: Yes    Comment: Occasional  . Drug use: No    Home Medications Prior to Admission medications   Medication Sig Start Date End Date Taking? Authorizing Provider  ALPRAZolam Duanne Moron) 0.25 MG tablet Take 0.25 mg by mouth daily as needed for sleep. 11/09/19  Yes [provider]  cefdinir (OMNICEF) 300 MG capsule Take 1 capsule (300 mg total) by mouth 2 (two) times daily. 02/22/20  Yes Gorsuch, Ni, MD  Cholecalciferol (VITAMIN D-3 PO) Take 4,000 Units by mouth daily.    Yes [provider]  estradiol (ESTRACE VAGINAL) 0.1 MG/GM vaginal cream Place 1 Applicatorful vaginally 3 (three) times a week. Patient taking differently: Place 1 Applicatorful vaginally as needed.  08/17/19  Yes Gorsuch, Ni, MD  FLUoxetine (PROZAC) 20 MG capsule Take 1 capsule (20 mg total) by mouth daily. 12/16/15  Yes Breeback, Jade L, PA-C  lidocaine-prilocaine (EMLA) cream Apply to affected area once Patient taking differently: Apply 1 application topically daily as needed (port access).  04/20/19  Yes Gorsuch, Ni, MD  loratadine (CLARITIN) 10 MG tablet Take 10 mg by mouth daily as needed for allergies.   Yes [provider]  ondansetron (ZOFRAN) 8 MG tablet Take 8 mg by mouth every 8 (eight) hours as needed for nausea or vomiting.    Yes [provider]  oxyCODONE 10 MG TABS Take 1 tablet (10 mg total) by mouth every 4 (four) hours as needed for severe pain. 02/26/20  Yes Gorsuch, Ni, MD  polyethylene glycol (MIRALAX / GLYCOLAX) 17 g packet Take 17 g by mouth 2 (two) times daily.   Yes [provider]  prochlorperazine (COMPAZINE) 10 MG tablet Take 10 mg by mouth every 6 (six) hours as needed for nausea or vomiting.    Yes [provider]  sennosides-docusate sodium (SENOKOT-S) 8.6-50 MG tablet Take 2 tablets by mouth in the morning and at bedtime.   Yes [provider]  simvastatin (ZOCOR) 40 MG tablet Take 1 tablet (40 mg total) by mouth at bedtime. 12/17/15  Yes Breeback, Jade L, PA-C    Allergies    Bee venom, Amlodipine, Demerol [meperidine hcl], Lisinopril, Losartan, and Other  Review of Systems   Review of Systems  Constitutional: Negative for chills, diaphoresis, fatigue and fever.  HENT: Negative for congestion, sore throat and trouble swallowing.   Eyes: Negative for pain and visual disturbance.  Respiratory:  Negative for cough, shortness of breath and wheezing.   Cardiovascular: Negative for chest pain, palpitations and leg swelling.  Gastrointestinal: Positive for abdominal pain, constipation and nausea. Negative for abdominal distention, diarrhea and vomiting.  Genitourinary: Negative for difficulty urinating, dyspareunia, flank pain, frequency, hematuria and urgency.  Musculoskeletal: Negative for back pain, neck pain and neck stiffness.  Skin: Negative for pallor.  Neurological: Negative for dizziness, speech difficulty, weakness and headaches.  Psychiatric/Behavioral: Negative for confusion.    Physical Exam Updated Vital Signs BP 137/72   Pulse (!) 109   Temp 98 F (36.7 C) (Oral)   Resp 18   Ht 5\' 7"  (1.702 m)   Wt 85 kg   SpO2 99%   BMI 29.35 kg/m   Physical Exam Constitutional:      General: She is not in acute distress.    Appearance: Normal appearance. She is not ill-appearing, toxic-appearing or diaphoretic.  HENT:     Head: Normocephalic and atraumatic.     Mouth/Throat:     Mouth: Mucous membranes are moist.     Pharynx: Oropharynx is clear.  Eyes:     General: No scleral icterus.    Extraocular Movements: Extraocular  movements intact.     Pupils: Pupils are equal, round, and reactive to light.  Cardiovascular:     Rate and Rhythm: Normal rate and regular rhythm.     Pulses: Normal pulses.     Heart sounds: Normal heart sounds.  Pulmonary:     Effort: Pulmonary effort is normal. No respiratory distress.     Breath sounds: Normal breath sounds. No stridor. No wheezing, rhonchi or rales.  Chest:     Chest wall: No tenderness.  Abdominal:     General: Abdomen is flat. There is no distension.     Palpations: Abdomen is soft.     Tenderness: There is abdominal tenderness in the right lower quadrant. There is no right CVA tenderness, guarding or rebound. Positive signs include McBurney's sign and obturator sign. Negative signs include Murphy's sign, Rovsing's sign and psoas sign.  Musculoskeletal:        General: No swelling or tenderness. Normal range of motion.     Cervical back: Normal range of motion and neck supple. No rigidity.     Right lower leg: No edema.     Left lower leg: No edema.  Skin:    General: Skin is warm and dry.     Capillary Refill: Capillary refill takes less than 2 seconds.     Coloration: Skin is not pale.  Neurological:     General: No focal deficit present.     Mental Status: She is alert and oriented to person, place, and time.  Psychiatric:        Mood and Affect: Mood normal.        Behavior: Behavior normal.     ED Results / Procedures / Treatments   Labs (all labs ordered are listed, but only abnormal results are displayed) Labs Reviewed  CBC WITH DIFFERENTIAL/PLATELET - Abnormal; Notable for the following components:      Result Value   RBC 3.42 (*)    Hemoglobin 10.5 (*)    HCT 31.5 (*)    Platelets 572 (*)    All other components within normal limits  COMPREHENSIVE METABOLIC PANEL - Abnormal; Notable for the following components:   Sodium 131 (*)    Chloride 93 (*)    Glucose, Bld 107 (*)    All other components  within normal limits  RESPIRATORY PANEL  BY RT PCR (FLU A&B, COVID)  LIPASE, BLOOD  URINALYSIS, ROUTINE W REFLEX MICROSCOPIC    EKG None  Radiology CT Abdomen Pelvis W Contrast  Result Date: 03/02/2020 CLINICAL DATA:  Right lower quadrant abdominal pain and sensation of pelvic pressure. History of ovarian cancer. EXAM: CT ABDOMEN AND PELVIS WITH CONTRAST TECHNIQUE: Multidetector CT imaging of the abdomen and pelvis was performed using the standard protocol following bolus administration of intravenous contrast. CONTRAST:  164mL OMNIPAQUE IOHEXOL 300 MG/ML  SOLN COMPARISON:  02/21/2020 FINDINGS: Lower chest: 4 mm calcified granuloma in the right lower lobe on image 2 of series 4. 3 mm pleural-based nodule along the right hemidiaphragm on image 7 of series 4 is stable. Other calcified granulomas are present at the right lung base. Mild linear scarring peripherally at the left lung base is unchanged. Left lower lobe granulomas are also present. Small type 1 hiatal hernia. 0.7 cm lymph node in the right pericardial space on image 11 of series 2, previously 0.5 cm. Hepatobiliary: Trace perihepatic ascites is new.  Cholecystectomy. Pancreas: Unremarkable Spleen: Old granulomatous disease. Adrenals/Urinary Tract: Both adrenal glands appear normal. Worsened right hydronephrosis and hydroureter extending down to a region of extrinsic compression on the ureter just past the iliac vessel cross over. Invasion of the ureter by the pelvic mass is not excluded. The degree of hydronephrosis is now moderate to prominent, likely with some minimal delay in excretion contrast medium. Consider urology consultation for potential stenting or percutaneous nephrostomy. The left kidney appears normal. The urinary bladder is effaced by the pelvic mass. Stomach/Bowel: Small type 1 hiatal hernia. There is prominence of stool throughout the colon extending down to the sigmoid colon. The sigmoid colon itself is effaced by the large pelvic mass and flattened by the mass, and  difficult to separate from the margins of the mass. Obstruction or invasion of the sigmoid colon by the mass is a distinct possibility. Vascular/Lymphatic: Aortoiliac atherosclerotic vascular disease. Portacaval node 1.2 cm in short axis on image 22 of series 2, previously 0.9 cm. Stable 0.7 cm left common iliac node on image 48 of series 2. Reproductive: Large multilobular pelvic mass or abscess is identified. The posterior portion measures 11.3 by 7.2 by 8.1 cm (volume = 350 cm^3) today and previously measured 9.4 by 6.0 by 7.0 cm (volume = 210 cm^3) ten days ago. The more anterior portion measures about 9.0 by 5.0 by 11.1 cm (volume = 260 cm^3) today and previously measured about 5.9 by 3.3 by 6.8 cm (volume = 69 cm^3) ten days ago. These collections appear continuous and sits just above the vaginal cuff. Other: As noted above, there is trace perihepatic ascites, increased from prior. Stable mild presacral edema. Musculoskeletal: Stable rectus diastasis with a small amount of the transverse colon bulging into the diastatic region. Stable lumbar spondylosis and degenerative disc disease. Right hip prosthesis noted. IMPRESSION: 1. Rapid enlargement of complex pelvic mass lesion over the last 10 days, causing increased obstruction of the adherent sigmoid colon and increased obstruction of the right ureter. Possibilities include rapidly growing tumor or abscess. It would be somewhat unusual for abscess to cause this degree of ureteral and bowel obstruction. 2. Trace perihepatic ascites, increased from prior. 3. Other imaging findings of potential clinical significance: Small type 1 hiatal hernia. Stable rectus diastasis with a small amount of the transverse colon bulging into the diastatic region. Stable lumbar spondylosis and degenerative disc disease. 4. Aortic atherosclerosis. Aortic Atherosclerosis (  ICD10-I70.0). Electronically Signed   By: Van Clines M.D.   On: 03/02/2020 12:55     Procedures Procedures (including critical care time)  Medications Ordered in ED Medications  sodium chloride (PF) 0.9 % injection (has no administration in time range)  HYDROmorphone (DILAUDID) injection 1 mg (1 mg Intravenous Given 03/02/20 1012)  ondansetron (ZOFRAN) injection 4 mg (4 mg Intravenous Given 03/02/20 1012)  iohexol (OMNIPAQUE) 300 MG/ML solution 100 mL (100 mLs Intravenous Contrast Given 03/02/20 1201)  sodium chloride 0.9 % bolus 1,000 mL (0 mLs Intravenous Stopped 03/02/20 1345)  HYDROmorphone (DILAUDID) injection 0.5 mg (0.5 mg Intravenous Given 03/02/20 1344)    ED Course  I have reviewed the triage vital signs and the nursing notes.  Pertinent labs & imaging results that were available during my care of the patient were reviewed by me and considered in my medical decision making (see chart for details).    MDM Rules/Calculators/A&P                          LEANETTE EUTSLER is a 69 y.o. female with pertinent past medical history of ovarian cancer diagnosed last year followed by Dr. Alvy Bimler s/p chemotherapy that ended this year in April, depression, hyperlipidemia, hypertension that presents the emergency department today for right lower quadrant abdominal pain and constipation.  High suspicion for bowel obstruction at this time since patient has many risk factors for this including cancer and multiple abdominal surgeries.  Initial interventions Dilaudid and Zofran.  Labs remarkable for sodium of 131, hemoglobin 10.5 which appears to be at baseline.  Normal urinalysis.  CT does show rapid enlargement of ovarian cancer with obstruction of sigmoid colon and some of the right ureter.  Will consult surgery at this time.  1330 spoke to Dr. Ninfa Linden, surgery who states that they will consult, states that there is nothing immediately to do today.  Wants medical admission.  1400 spoke to Dr. Neysa Bonito, hospitalist who accept the patient.  The patient appears reasonably  stabilized for admission considering the current resources, flow, and capabilities available in the ED at this time, and I doubt any other [facility] requiring further screening and/or treatment in the ED prior to admission.  Did also in basket Dr. Alvy Bimler to make her aware of patient status.  Discussed case with my attending physician who is agreeable to plan to consult for admission. 2:56 PM discussed case with hospitalist who agrees to accept care of patient.  I discussed this case with my attending physician who cosigned this note including patient's presenting symptoms, physical exam, and planned diagnostics and interventions. Attending physician stated agreement with plan or made changes to plan which were implemented.   Attending physician assessed patient at bedside.   Final Clinical Impression(s) / ED Diagnoses Final diagnoses:  Pelvic mass  Obstruction of colon [facility])    Rx / DC Orders ED Discharge Orders    None       Alfredia Client, PA-C 03/02/20 1457    Dorie Rank, MD 03/03/20 [phone]

## 2020-03-02 NOTE — ED Triage Notes (Signed)
Pt arrived via walk in, c/o RLQ abd pain and constipation x3 days. States she believes she has a bowel obstructions. No hx of such. Denies any vomiting.

## 2020-03-02 NOTE — H&P (Addendum)
History and Physical        Hospital Admission Note Date: 03/02/2020  Patient name: Connie Heath Medical record number: [id number] Date of birth: 1951-02-08 Age: 69 y.o. Gender: female  PCP: Michael Boston, MD  Patient coming from: Home Lives with: Husband At baseline, ambulates: Independently  Chief Complaint    Chief Complaint  Patient presents with  . Abdominal Pain  . 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 Alvy Bimler, most recent appointment on 10/12), cancer related pain, anemia of chronic disease, right-sided hydronephrosis, depression, hyperlipidemia, hypertension who presented to the ED with worsening RLQ abdominal pain, rectal pain and constipation.  Per Dr. Calton Dach note on 10/5, the patient had new onset abdominal pain and flank pain and was found to have a UTI and started on Omnicef, given a prescription of oxycodone as needed and a CT scan was ordered.  CT scan at that time was concerning for either cancer recurrence or pelvic abscess though an abscess was less likely.  Plan was to start on palliative chemotherapy with doxorubicin and a baseline echo was ordered (plan to start chemo on Tuesday).  She is now having right lower quadrant pain which is different than her normal pain that is severe and does not radiate anywhere and is constant.  Constipation has not improved with senna and MiraLAX.  Pain started 3-5 days ago and also has not had a BM in the past 1-2 days.  Has decreased appetite.  Is passing a small amount of flatus.  Also with rectal pain which is her more significant symptom and has had nausea without vomiting.  Denies fever, chills, dysuria,  hematuria, vaginal pain, vaginal discharge or bleeding, chest pain.  Patient has had a hysterectomy and oophorectomy and cholecystectomy.  ED Course: Afebrile, tachycardic and hypertensive and tolerating room air.  Labs notable for sodium 131, chloride 93, Hb 10.5 (at baseline), UA negative.  CT abdomen pelvis most notable for rapid enlargement of complex pelvic mass since most recent CT scan with increased obstruction of the adherent sigmoid colon and increased obstruction of the right ureter, trace hepatic ascites.  She was given Dilaudid, Zofran and 1 L NS bolus.   Vitals:   03/02/20 1311 03/02/20 1451  BP: 130/71 137/72  Pulse: 98 (!) 109  Resp: 16 18  Temp:    SpO2: 99% 99%     Review of Systems:  Review of Systems  Constitutional: Negative for chills and fever.  Respiratory: Negative for shortness of breath.   Cardiovascular: Negative for chest pain and palpitations.  Gastrointestinal: Positive for abdominal pain, constipation and nausea. Negative for blood in stool and vomiting.       Stool only with clear mucus over the past 24 to 48 hours  Genitourinary: Negative for dysuria, hematuria and urgency.  All other systems reviewed and are negative.   Medical/Social/Family History   Past Medical History: Past Medical History:  Diagnosis Date  . #[id number] dx'd 03/2019   ovarian cancer   .  Depression   . Dyspnea    with increased exertion   . Family history of lung cancer   . Family history of stomach cancer   . Fatigue   . GERD (gastroesophageal reflux disease)   . Hyperlipidemia    H/o myalgias with Vytorin but tolerating simvastatin  . Hypertension   . Myalgia    Hx of   . S/P abdominal paracentesis 04/25/2019   pulled off 2.5 liters  . S/P cholecystectomy   . S/P hysterectomy   . Shingles     Past Surgical History:  Procedure Laterality Date  . DIAGNOSTIC LAPAROSCOPY    . IR IMAGING GUIDED PORT INSERTION  05/17/2019  . JOINT REPLACEMENT     right total hip    . LAPAROSCOPIC CHOLECYSTECTOMY    . PARACENTESIS    . PARATHYROIDECTOMY Right 2013  . PARTIAL HYSTERECTOMY    . TONSILLECTOMY AND ADENOIDECTOMY      Medications: Prior to Admission medications   Medication Sig Start Date End Date Taking? Authorizing Provider  ALPRAZolam Duanne Moron) 0.25 MG tablet Take 0.25 mg by mouth daily as needed for sleep. 11/09/19  Yes [provider]  cefdinir (OMNICEF) 300 MG capsule Take 1 capsule (300 mg total) by mouth 2 (two) times daily. 02/22/20  Yes Gorsuch, Ni, MD  Cholecalciferol (VITAMIN D-3 PO) Take 4,000 Units by mouth daily.    Yes [provider]  estradiol (ESTRACE VAGINAL) 0.1 MG/GM vaginal cream Place 1 Applicatorful vaginally 3 (three) times a week. Patient taking differently: Place 1 Applicatorful vaginally as needed.  08/17/19  Yes Gorsuch, Ni, MD  FLUoxetine (PROZAC) 20 MG capsule Take 1 capsule (20 mg total) by mouth daily. 12/16/15  Yes Breeback, Jade L, PA-C  lidocaine-prilocaine (EMLA) cream Apply to affected area once Patient taking differently: Apply 1 application topically daily as needed (port access).  04/20/19  Yes Gorsuch, Ni, MD  loratadine (CLARITIN) 10 MG tablet Take 10 mg by mouth daily as needed for allergies.   Yes [provider]  ondansetron (ZOFRAN) 8 MG tablet Take 8 mg by mouth every 8 (eight) hours as needed for nausea or vomiting.    Yes [provider]  oxyCODONE 10 MG TABS Take 1 tablet (10 mg total) by mouth every 4 (four) hours as needed for severe pain. 02/26/20  Yes Gorsuch, Ni, MD  polyethylene glycol (MIRALAX / GLYCOLAX) 17 g packet Take 17 g by mouth 2 (two) times daily.   Yes [provider]  prochlorperazine (COMPAZINE) 10 MG tablet Take 10 mg by mouth every 6 (six) hours as needed for nausea or vomiting.    Yes [provider]  sennosides-docusate sodium (SENOKOT-S) 8.6-50 MG tablet Take 2 tablets by mouth in the morning and at bedtime.   Yes [provider]    simvastatin (ZOCOR) 40 MG tablet Take 1 tablet (40 mg total) by mouth at bedtime. 12/17/15  Yes Breeback, Jade L, PA-C    Allergies:   Allergies  Allergen Reactions  . Bee Venom Anaphylaxis  . Amlodipine Swelling    also ineffective  . Demerol [Meperidine Hcl] Nausea And Vomiting       . Lisinopril Cough  . Losartan     Bladder pain  . Other Rash    Dermabond Surgical glue- rash, whelps    Social History:  reports that she has never smoked. She has never used smokeless tobacco. She reports current alcohol use. She reports that she does not use drugs.  Family History: Family  History  Problem Relation Age of Onset  . Lung cancer Mother   . Cancer Father 74       Stomach  . Heart failure Maternal Grandmother 80       CHF  . Heart failure Maternal Grandfather 50       Acute MI  . Cancer Paternal Aunt        either colon or liver     Objective   Physical Exam: Blood pressure 137/72, pulse (!) 109, temperature 98 F (36.7 C), temperature source Oral, resp. rate 18, height 5\' 7"  (1.702 m), weight 85 kg, SpO2 99 %.  Physical Exam Vitals and nursing note reviewed. Exam conducted with a chaperone present.  Constitutional:      Appearance: Normal appearance.     Comments: Chronically ill-appearing  HENT:     Head: Normocephalic and atraumatic.  Eyes:     Conjunctiva/sclera: Conjunctivae normal.  Cardiovascular:     Rate and Rhythm: Normal rate and regular rhythm.  Pulmonary:     Effort: Pulmonary effort is normal.     Breath sounds: Normal breath sounds.  Abdominal:     General: Bowel sounds are increased.     Palpations: Abdomen is soft.     Tenderness: There is abdominal tenderness in the right lower quadrant.  Genitourinary:    Comments: No rectal prolapse Musculoskeletal:        General: No swelling or tenderness.  Skin:    Coloration: Skin is pale. Skin is not jaundiced.  Neurological:     Mental Status: She is alert. Mental status is at baseline.   Psychiatric:        Mood and Affect: Mood normal.        Behavior: Behavior normal.     LABS on Admission: I have personally reviewed all the labs and imaging below    Basic Metabolic Panel: Recent Labs  Lab 02/26/20 1406 03/02/20 1015  NA 132* 131*  K 4.5 3.7  CL 95* 93*  CO2 30 23  GLUCOSE 99 107*  BUN 8 8  CREATININE 0.96 0.82  CALCIUM 10.4* 9.5   Liver Function Tests: Recent Labs  Lab 02/26/20 1406 03/02/20 1015  AST 20 23  ALT 20 15  ALKPHOS 106 71  BILITOT 0.3 0.6  PROT 8.2* 7.7  ALBUMIN 3.6 3.8   Recent Labs  Lab 03/02/20 1015  LIPASE 19   No results for input(s): AMMONIA in the last 168 hours. CBC: Recent Labs  Lab 02/26/20 1406 02/26/20 1406 03/02/20 1015  WBC 9.2  --  9.6  NEUTROABS 6.3   < > 6.8  HGB 11.0*  --  10.5*  HCT 32.2*  --  31.5*  MCV 91.0   < > 92.1  PLT 497*  --  572*   < > = values in this interval not displayed.   Cardiac Enzymes: No results for input(s): CKTOTAL, CKMB, CKMBINDEX, TROPONINI in the last 168 hours. BNP: Invalid input(s): POCBNP CBG: No results for input(s): GLUCAP in the last 168 hours.  Radiological Exams on Admission:  CT Abdomen Pelvis W Contrast  Result Date: 03/02/2020 CLINICAL DATA:  Right lower quadrant abdominal pain and sensation of pelvic pressure. History of ovarian cancer. EXAM: CT ABDOMEN AND PELVIS WITH CONTRAST TECHNIQUE: Multidetector CT imaging of the abdomen and pelvis was performed using the standard protocol following bolus administration of intravenous contrast. CONTRAST:  166mL OMNIPAQUE IOHEXOL 300 MG/ML  SOLN COMPARISON:  02/21/2020 FINDINGS: Lower chest: 4 mm calcified granuloma  in the right lower lobe on image 2 of series 4. 3 mm pleural-based nodule along the right hemidiaphragm on image 7 of series 4 is stable. Other calcified granulomas are present at the right lung base. Mild linear scarring peripherally at the left lung base is unchanged. Left lower lobe granulomas are also  present. Small type 1 hiatal hernia. 0.7 cm lymph node in the right pericardial space on image 11 of series 2, previously 0.5 cm. Hepatobiliary: Trace perihepatic ascites is new.  Cholecystectomy. Pancreas: Unremarkable Spleen: Old granulomatous disease. Adrenals/Urinary Tract: Both adrenal glands appear normal. Worsened right hydronephrosis and hydroureter extending down to a region of extrinsic compression on the ureter just past the iliac vessel cross over. Invasion of the ureter by the pelvic mass is not excluded. The degree of hydronephrosis is now moderate to prominent, likely with some minimal delay in excretion contrast medium. Consider urology consultation for potential stenting or percutaneous nephrostomy. The left kidney appears normal. The urinary bladder is effaced by the pelvic mass. Stomach/Bowel: Small type 1 hiatal hernia. There is prominence of stool throughout the colon extending down to the sigmoid colon. The sigmoid colon itself is effaced by the large pelvic mass and flattened by the mass, and difficult to separate from the margins of the mass. Obstruction or invasion of the sigmoid colon by the mass is a distinct possibility. Vascular/Lymphatic: Aortoiliac atherosclerotic vascular disease. Portacaval node 1.2 cm in short axis on image 22 of series 2, previously 0.9 cm. Stable 0.7 cm left common iliac node on image 48 of series 2. Reproductive: Large multilobular pelvic mass or abscess is identified. The posterior portion measures 11.3 by 7.2 by 8.1 cm (volume = 350 cm^3) today and previously measured 9.4 by 6.0 by 7.0 cm (volume = 210 cm^3) ten days ago. The more anterior portion measures about 9.0 by 5.0 by 11.1 cm (volume = 260 cm^3) today and previously measured about 5.9 by 3.3 by 6.8 cm (volume = 69 cm^3) ten days ago. These collections appear continuous and sits just above the vaginal cuff. Other: As noted above, there is trace perihepatic ascites, increased from prior. Stable mild  presacral edema. Musculoskeletal: Stable rectus diastasis with a small amount of the transverse colon bulging into the diastatic region. Stable lumbar spondylosis and degenerative disc disease. Right hip prosthesis noted. IMPRESSION: 1. Rapid enlargement of complex pelvic mass lesion over the last 10 days, causing increased obstruction of the adherent sigmoid colon and increased obstruction of the right ureter. Possibilities include rapidly growing tumor or abscess. It would be somewhat unusual for abscess to cause this degree of ureteral and bowel obstruction. 2. Trace perihepatic ascites, increased from prior. 3. Other imaging findings of potential clinical significance: Small type 1 hiatal hernia. Stable rectus diastasis with a small amount of the transverse colon bulging into the diastatic region. Stable lumbar spondylosis and degenerative disc disease. 4. Aortic atherosclerosis. Aortic Atherosclerosis (ICD10-I70.0). Electronically Signed   By: Van Clines M.D.   On: 03/02/2020 12:55      EKG: Not done in ED   A & P   Principal Problem:   Bowel obstruction (HCC) Active Problems:   Constipation   Anemia, chronic disease   Hydronephrosis of right kidney   Cancer related pain   1. RLQ and rectal pain with constipation secondary to complex pelvic mass and sigmoid obstruction in the setting of history of ovarian cancer with concern for rapidly growing tumor  Cancer-related pain a. CT with rapid enlargement of complex  pelvic mass in the last 10 days causing obstruction of the adherent sigmoid colon and increased obstruction of the right ureter b. Not likely an abscess she is afebrile and without leukocytosis-hold antibiotics c. Tolerating Dilaudid, continue d. General surgery consulted, discussed with Dr. Ninfa Linden e. Oncology consulted f. MiraLAX g. Hold senna in the setting of obstruction h. Patient wishes to hold off on palliative care consult until further discussion with surgery  and oncology but she understands her prognosis is poor  2. Anemia of chronic disease, stable  3. Hyponatremia a. Received IV fluids in the ED  4. Anxiety and depression a. Continue home meds  5. Right ureteral obstruction a. Discussed with Dr. Sheppard Coil, Urology resident, recommends observation for now unless she develops renal dysfunction, UTI and/or significant symptoms and recommends formal urology consult   DVT prophylaxis: Lovenox   Code Status: Not on file  Diet: Clear liquid diet Family Communication: Admission, patients condition and plan of care including tests being ordered have been discussed with the patient who indicates understanding and agrees with the plan and Code Status. Patient's husband at bedside was updated  Disposition Plan: The appropriate patient status for this patient is INPATIENT. Inpatient status is judged to be reasonable and necessary in order to provide the required intensity of service to ensure the patient's safety. The patient's presenting symptoms, physical exam findings, and initial radiographic and laboratory data in the context of their chronic comorbidities is felt to place them at high risk for further clinical deterioration. Furthermore, it is not anticipated that the patient will be medically stable for discharge from the hospital within 2 midnights of admission. The following factors support the patient status of inpatient.   " The patient's presenting symptoms include right lower quadrant pain and rectal pain. " The worrisome physical exam findings include right lower quadrant discomfort. " The initial radiographic and laboratory data are worrisome because of enlarging pelvic mass. " The chronic co-morbidities include ovarian cancer.   * I certify that at the point of admission it is my clinical judgment that the patient will require inpatient hospital care spanning beyond 2 midnights from the point of admission due to high intensity of service,  high risk for further deterioration and high frequency of surveillance required.*    Consultants  . General surgery, discussed with Dr. Ninfa Linden . Oncology, discussed with Dr. Lorenso Courier . Discussed with urology over the phone  Procedures  . None  Time Spent on Admission: 76 minutes    Harold Hedge, DO [facility]  03/02/2020, 3:35 PM

## 2020-03-03 ENCOUNTER — Encounter: Payer: Self-pay | Admitting: Oncology

## 2020-03-03 ENCOUNTER — Other Ambulatory Visit: Payer: Self-pay

## 2020-03-03 DIAGNOSIS — K56609 Unspecified intestinal obstruction, unspecified as to partial versus complete obstruction: Secondary | ICD-10-CM

## 2020-03-03 DIAGNOSIS — C561 Malignant neoplasm of right ovary: Principal | ICD-10-CM

## 2020-03-03 LAB — CBC
HCT: 29.8 % — ABNORMAL LOW (ref 36.0–46.0)
Hemoglobin: 9.9 g/dL — ABNORMAL LOW (ref 12.0–15.0)
MCH: 30.8 pg (ref 26.0–34.0)
MCHC: 33.2 g/dL (ref 30.0–36.0)
MCV: 92.8 fL (ref 80.0–100.0)
Platelets: 491 10*3/uL — ABNORMAL HIGH (ref 150–400)
RBC: 3.21 MIL/uL — ABNORMAL LOW (ref 3.87–5.11)
RDW: 13.6 % (ref 11.5–15.5)
WBC: 9 10*3/uL (ref 4.0–10.5)
nRBC: 0 % (ref 0.0–0.2)

## 2020-03-03 LAB — HIV ANTIBODY (ROUTINE TESTING W REFLEX): HIV Screen 4th Generation wRfx: NONREACTIVE

## 2020-03-03 LAB — CREATININE, SERUM
Creatinine, Ser: 0.96 mg/dL (ref 0.44–1.00)
GFR, Estimated: >60 mL/min (ref >60)

## 2020-03-03 MED ORDER — HYDROMORPHONE HCL 1 MG/ML IJ SOLN
1.0000 mg | INTRAMUSCULAR | Status: DC | PRN
  Administered 2020-03-03 – 2020-03-04 (×6): 1 mg via INTRAVENOUS
  Filled 2020-03-03 (×6): qty 1

## 2020-03-03 NOTE — Assessment & Plan Note (Addendum)
-   due to mass compression  - on admission case discussed with urology; oncology has also discussed -Renal function remains stable with no signs of obstruction -Trend BMP

## 2020-03-03 NOTE — Hospital Course (Addendum)
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 Alvy Bimler, most recent appointment on 10/12), cancer related pain, anemia of chronic disease, right-sided hydronephrosis, depression, hyperlipidemia, hypertension who presented to the ED with worsening RLQ abdominal pain, rectal pain and constipation.   Per Dr. Calton Dach note on 10/5, the patient had new onset abdominal pain and flank pain and was found to have a UTI and started on Omnicef, given a prescription of oxycodone as needed and a CT scan was ordered.  CT scan at that time was concerning for either cancer recurrence or pelvic abscess though an abscess was less likely.  Plan was to start on palliative chemotherapy with doxorubicin and a baseline echo was ordered (plan to start chemo on Tuesday).    She now presented with having right lower quadrant pain which is different than her normal pain that is severe and does not radiate anywhere and is constant.  Constipation has not improved with senna and MiraLAX.  Pain started 3-5 days PTA and she has not had a BM in the past 1-2 days.  Has decreased appetite.  Is passing a small amount of flatus.  Also with rectal pain which is her more significant symptom and has had nausea without vomiting.  Denies fever, chills, dysuria, hematuria, vaginal pain, vaginal discharge or bleeding, chest pain.  Patient has had a hysterectomy and oophorectomy and cholecystectomy.   ED Course: Afebrile, tachycardic and hypertensive and tolerating room air.  Labs notable for sodium 131, chloride 93, Hb 10.5 (at baseline), UA negative.  CT abdomen pelvis most notable for rapid enlargement of complex pelvic mass since most recent CT scan with increased obstruction of the adherent sigmoid colon and increased obstruction of the right ureter, trace hepatic  ascites.  She was given Dilaudid, Zofran and 1 L NS bolus.  Oncology was consulted on admission.  Her case was reviewed as well as CT findings.  Given obstruction of the sigmoid colon due to her mass, it was recommended for diversion colostomy prior to initiation of chemotherapy for further tumor shrinking. This is tentatively planned for 10/19.

## 2020-03-03 NOTE — Progress Notes (Addendum)
[place] PROGRESS NOTE  Patient Care Team: Michael Boston, MD as PCP - General (Internal Medicine) Awanda Mink Craige Cotta, RN as Oncology Nurse Navigator (Oncology) I have seen the patient, examined her and edited the notes as follows  ASSESSMENT & PLAN:  Ovarian cancer on right [facility]) We have extensive discussion about her case recently at the GYN oncology tumor board recently CT of the abdomen pelvis performed on 10/7 has previously been reviewed showing disease progression Repeat CT of the abdomen pelvis performed in the emergency room on 10/17 shows rapid enlargement of her pelvic mass causing obstruction of the sigmoid colon and worsening obstruction of the right ureter  She is symptomatic Unfortunately, even with inpatient chemotherapy, I do not expect her tumor to shrink rapidly I have reviewed her case with Dr. Denman George Overall, our consensus would be to consult general surgery for diversion colostomy before we proceed with chemotherapy  Bowel obstruction secondary to pelvic mass Pain not fully controlled with current dose of Dilaudid and will increase to 1 mg every 2 hours as needed She is not currently having any nausea or vomiting continues to pass flatus Case has been reviewed with general surgery and consult pending Awaiting general surgery recommendations I recommend diversion colostomy before we proceed with chemotherapy to alleviate bowel obstruction   Cancer associated pain Her pelvic pain is suboptimally controlled Dilaudid dose increased to 1 mg every 2 hours as needed for pain  She also has hydrocodone available to her   Anemia, chronic disease She has multifactorial anemia, likely anemia chronic disease Vitamin B12 level, ferritin, iron studies were checked in our office on 10/12-vitamin B12 and ferritin are adequate, but she has a low iron and percent saturation She would be at risk of worsening anemia while on treatment Transfuse for hemoglobin less than  8   Hydronephrosis of right kidney She was evaluated by urologist recently I have discussed this case with him and reviewed his documentation Given her preserved renal function, she is recommended for close observation for now I agreed not to proceed with stent placement We will monitor her renal function carefully   Goals of care, counseling/discussion We have extensive discussions about goals of care She is aware that with recurrent disease, her cancer is considered noncurative I explained to the patient why surgery is not indicated We discussed the palliative nature of chemotherapy and she will likely need long-term treatment even if she achieved complete response to therapy She has advanced directives at home She does not want to be resuscitated -DNR order in place  Discharge planning The patient will need to have well-controlled abdominal pain and to tolerate her diet before discharge can be considered Will likely be here at least until the end of the week I have canceled her treatment tomorrow I will follow her while she is hospitalized  All questions were answered. The patient knows to call the clinic with any problems, questions or concerns.   [name], NP 03/03/2020 8:02 AM [name], MD  INTERVAL HISTORY: The patient came to the emergency room due to worsening abdominal pain/discomfort She reports that she stopped moving her bowels late Friday/early Saturday She still passes a small amount of clear mucus and continues to have flatus She had some nausea but no vomiting; nausea has now resolved Abdominal pain somewhat controlled with Dilaudid-dose was decreased from 1 mg to 0.5 mg, but 0.5 mg dose not fully effective and requests increase back to 1 mg   SUMMARY  OF ONCOLOGIC HISTORY: Oncology History Overview Note  Neg genetics   Ovarian cancer on right ([place])  01/16/2019 Initial Diagnosis   She had vague symptom of dyspareunia. Presented to ER for evaluation in  November for abdominal pain and vague symptom of fullness and imaging showed abnormalities   04/16/2019 Imaging   1. Nonvisualization of the appendix. However, there is a large volume of ascites within the abdomen and pelvis which appears partially loculated. In the acute setting findings may be the sequelae of peritonitis. However, there are multiple partially calcified peritoneal nodules throughout the omentum which are concerning for peritoneal carcinomatosis. Noncalcified nodule in the left lower quadrant peritoneal reflection is noted. Further investigation with diagnostic paracentesis is advised. 2. Ill-defined low-density mass within the right iliac fossa is suspected and may represent an ovarian neoplasm. 3. Aortic atherosclerosis. 4. Small bilateral pleural effusions. 5. Hiatal hernia. 6. Prior granulomatous disease.   Aortic Atherosclerosis (ICD10-I70.0).   04/17/2019 Tumor Marker   Patient's tumor was tested for the following markers: CA-125 Results of the tumor marker test revealed 288.   04/18/2019 Procedure   Successful ultrasound-guided diagnostic and therapeutic paracentesis yielding 3.2 liters of peritoneal fluid.   04/18/2019 Pathology Results   FINAL MICROSCOPIC DIAGNOSIS:  - Malignant cells present  - See comment   SPECIMEN ADEQUACY:  Satisfactory for evaluation   DIAGNOSTIC COMMENTS:  The cell block has scattered malignant cells.  These atypical cells are positive for cytokeraitn 7, MOC-31, Pax8, and WT-1 but negative for TTF-1 and cytokeratin 20.  Overall, the features are consistent with a primary gynecologic neoplasm   04/20/2019 Cancer Staging   Staging form: Ovary, Fallopian Tube, and Primary Peritoneal Carcinoma, AJCC 8th Edition - Clinical stage from 04/20/2019: FIGO Stage IIIC (cT3c, cN0, cM0) - Signed by [name], MD on 04/20/2019   04/25/2019 Procedure   Successful ultrasound-guided therapeutic paracentesis yielding 2.5 liters of peritoneal fluid.      04/27/2019 - 09/10/2019 Chemotherapy   The patient had carboplatin and taxol x 3 cycles for neoadjuvant chemotherapy treatment, followed by interval debulking surgery and 3 more cycles of chemotherapy     05/17/2019 Procedure   Placement of a subcutaneous port device. Catheter tip at the SVC and right atrium junction.   06/11/2019 Tumor Marker   Patient's tumor was tested for the following markers: CA-125 Results of the tumor marker test revealed 20.8   06/18/2019 Imaging   CT chest, abdomen and pelvis 1. Positive response to therapy. Complex 6.6 cm cystic right adnexal mass is decreased in size. Omental nodularity has largely resolved. Ascites has resolved. No adenopathy. 2. No evidence of metastatic disease in the chest. 3. Chronic findings include: Aortic Atherosclerosis (ICD10-I70.0). Small hiatal hernia. Mild sigmoid diverticulosis.   07/03/2019 Surgery   Preoperative Diagnosis: ovarian cancer stage IIIC     Procedure(s) Performed: Robotic-assisted laparoscopic bilateral salpingo-oophorectomy, omentectomy, radical tumor debulking with mini-laparotomy. Optimal cytoreduction with no gross residual disease remaining.    Surgeon: Everitt Amber, M.D. Specimens: Bilateral ovaries, fallopian tubes, omentum    Indication for Procedure:  Patient has a history of stage IIIC ovarian cancer, s/p 3 cycles of neoadjuvant chemotherapy with good partial clinical response. Right ovarian mass on imaging and omental caking.   Operative Findings: 6-8cm right ovarian mass densely adherent to pelvic sidewall with filmy adhesions to colonic epiploica but not bowel wall. Normal appendix. Slightly enlarged left tube and ovary but without discrete mass. Omental nodularity throughout infracolic omentum. Tiny miliary <30m nodules on left diaphragm.  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 Other Tissue/ Organ Involvement: Left ovary and omentum Largest Extrapelvic Peritoneal Focus: 4.3 cm tumor deposit in the omentum Peritoneal/Ascitic Fluid: Positive for carcinoma ([id number]) Treatment Effect: Moderate response identified (CRS 2) Regional Lymph Nodes: No lymph nodes submitted or found Pathologic Stage Classification (pTNM, AJCC 8th Edition): pT3c, pNx Representative Tumor Block: B10 Comment(s): Only the serous component shows metastasis to the omentum and spread to left ovary and right fallopian tube   07/27/2019 Tumor Marker   Patient's tumor was tested for the following markers: CA-125 Results of the tumor marker test revealed 13.7   08/14/2019 Genetic Testing   Negative germline + somatic testing. No pathogenic variants identified on the [employer]. VUS in MRE11A identified in the germline.  The report date is 08/14/2019.   The CancerNext gene panel offered by [employer] includes sequencing and rearrangement analysis for the following 36 genes: APC*, ATM*, AXIN2, BARD1, BMPR1A, BRCA1*, BRCA2*, BRIP1*, CDH1*, CDK4, CDKN2A, CHEK2*, DICER1, MLH1*, MSH2*, MSH3, MSH6*, MUTYH*, NBN, NF1*, NTHL1, PALB2*, PMS2*, PTEN*, RAD51C*, RAD51D*, RECQL, SMAD4, SMARCA4, STK11 and TP53* (sequencing and deletion/duplication); HOXB13, POLD1 and POLE (sequencing only); EPCAM and GREM1 (deletion/duplication only).   Somatic genes analyzed through TumorNext-HRD: ATM, BARD1, BRCA1, BRCA2, BRIP1, CHEK2, MRE11A, NBN, PALB2, RAD51C, RAD51D.     09/10/2019 Tumor Marker   Patient's tumor was tested for the following markers: CA-125 Results of the tumor marker test revealed 9.5   10/10/2019  Imaging   1. No findings suspicious for recurrent metastatic disease in the abdomen or pelvis. No adenopathy. Minimal smooth peritoneal thickening in the paracolic gutters and deep pelvis, nonspecific, with no discrete peritoneal implants. No ascites. Continued CT surveillance warranted. 2. Chronic findings include: Aortic Atherosclerosis (ICD10-I70.0). Small hiatal hernia. Minimal sigmoid diverticulosis.     10/10/2019 Tumor Marker   Patient's tumor was tested for the following markers: CA-125 Results of the tumor marker test revealed 8.9   02/18/2020 Tumor Marker   Patient's tumor was tested for the following markers: CA-125. Results of the tumor marker test revealed 10.9   02/18/2020 Imaging   No acute abnormality noted.     02/21/2020 Imaging   1. Complex cystic mass versus less likely complex abscess along the vaginal cuff and extending anterior to the rectum and along the right lateral side of the rectosigmoid junction, with components of the process extending up towards the adnexa, right greater than left. This measures up to 10.9 cm in diameter. If this represents an abscess I would expect the patient have striking symptoms (fever, pain, etc.). Given the patient's history, the appearance tends to favor recurrent ovarian malignancy over abscess. 2. Mild right hydronephrosis and hydroureter extending down to the level of the recurrent pelvic mass, indicating some low-grade obstruction. 3. There is sigmoid colon diverticulosis along with some stranding and wall indistinctness of the sigmoid colon which could reflect diverticulitis or colitis. Assessment is made more complex by the proximity of the pelvic mass. 4. Prominence of stool proximal to the sigmoid colon possibly from constipation or low-grade partial obstruction. 5. Other imaging findings of potential clinical significance: Small type 1 hiatal hernia. Old granulomatous disease. Lumbar spondylosis and degenerative disc disease causing  impingement  at L3-4, L4-5, and L5-S1. Mild rectus diastasis with transverse colon and adipose tissue extending along the diastatic region. 6. Aortic atherosclerosis.   02/27/2020 Echocardiogram   1. Left ventricular ejection fraction, by estimation, is 60 to 65%. The left ventricle has normal function. The left ventricle has no regional wall motion abnormalities. Left ventricular diastolic parameters are consistent with Grade I diastolic dysfunction (impaired relaxation). The average left ventricular global longitudinal strain is -18.3 %.  2. Right ventricular systolic function is normal. The right ventricular size is normal. There is normal pulmonary artery systolic pressure. The estimated right ventricular systolic pressure is 05.3 mmHg.  3. The mitral valve is normal in structure. Trivial mitral valve regurgitation.  4. The aortic valve is tricuspid. Aortic valve regurgitation is mild. No aortic stenosis is present.  5. The inferior vena cava is normal in size with greater than 50% respiratory variability, suggesting right atrial pressure of 3 mmHg.   03/04/2020 -  Chemotherapy   The patient had DOXOrubicin (ADRIAMYCIN) chemo injection 150 mg, 75 mg/m2 = 150 mg, Intravenous,  Once, 0 of 4 cycles palonosetron (ALOXI) injection 0.25 mg, 0.25 mg, Intravenous,  Once, 0 of 4 cycles fosaprepitant (EMEND) 150 mg in sodium chloride 0.9 % 145 mL IVPB, 150 mg, Intravenous,  Once, 0 of 4 cycles  for chemotherapy treatment.    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       REVIEW OF SYSTEMS:   Constitutional: Denies fevers, chills or abnormal weight loss Eyes: Denies blurriness of vision Ears, nose, mouth, throat, and face: Denies mucositis or sore throat Respiratory: Denies cough, dyspnea or wheezes Cardiovascular: Denies palpitation, chest discomfort or lower extremity swelling Gastrointestinal: Reports abdominal pain/pressure, nausea has now resolved, denies vomiting, no bowel movement since late Friday/early Saturday but still passing clear mucus and flatus Skin: Denies abnormal skin rashes Lymphatics: Denies new lymphadenopathy or easy bruising Neurological:Denies numbness, tingling or new weaknesses Behavioral/Psych: Mood is stable, no new changes  All other systems were reviewed with the patient and are negative.  I have reviewed the past medical history, past surgical history, social history and family history with the patient and they are unchanged from previous note.  ALLERGIES:  is allergic to bee venom, amlodipine, demerol [meperidine hcl], lisinopril, losartan, and other.  MEDICATIONS:  Current Facility-Administered Medications  Medication Dose Route Frequency Provider Last Rate Last Admin   acetaminophen (TYLENOL) tablet 650 mg  650 mg Oral Q6H PRN Harold Hedge, MD       Or   acetaminophen (TYLENOL) suppository 650 mg  650 mg Rectal Q6H PRN Harold Hedge, MD       ALPRAZolam Duanne Moron) tablet 0.25 mg  0.25 mg Oral Daily PRN Harold Hedge, MD       diphenhydrAMINE-zinc acetate (BENADRYL) 2-0.1 % cream   Topical BID PRN Neila Gear, NP   Given at 03/02/20 2042   enoxaparin (LOVENOX) injection 40 mg  40 mg Subcutaneous Q24H Harold Hedge, MD   40 mg at 03/02/20 2349  FLUoxetine (PROZAC) capsule 20 mg  20 mg Oral Daily Harold Hedge, MD   20 mg at 03/02/20 2350   HYDROcodone-acetaminophen (NORCO/VICODIN) 5-325 MG per tablet 1-2 tablet  1-2 tablet Oral Q4H PRN Harold Hedge, MD   2 tablet at 03/02/20 2354   HYDROmorphone (DILAUDID)  injection 0.5 mg  0.5 mg Intravenous Q2H PRN Harold Hedge, MD   0.5 mg at 03/03/20 0738   ondansetron (ZOFRAN) tablet 4 mg  4 mg Oral Q6H PRN Harold Hedge, MD       Or   ondansetron [facility]) injection 4 mg  4 mg Intravenous Q6H PRN Harold Hedge, MD   4 mg at 03/02/20 1715   polyethylene glycol (MIRALAX / GLYCOLAX) packet 17 g  17 g Oral BID Harold Hedge, MD       simvastatin (ZOCOR) tablet 40 mg  40 mg Oral QHS Harold Hedge, MD   40 mg at 03/02/20 2349   triamcinolone cream (KENALOG) 0.1 %   Topical BID Neila Gear, NP   Given at 03/02/20 2042   Current Outpatient Medications  Medication Sig Dispense Refill   ALPRAZolam (XANAX) 0.25 MG tablet Take 0.25 mg by mouth daily as needed for sleep.     cefdinir (OMNICEF) 300 MG capsule Take 1 capsule (300 mg total) by mouth 2 (two) times daily. 14 capsule 0   Cholecalciferol (VITAMIN D-3 PO) Take 4,000 Units by mouth daily.      estradiol (ESTRACE VAGINAL) 0.1 MG/GM vaginal cream Place 1 Applicatorful vaginally 3 (three) times a week. (Patient taking differently: Place 1 Applicatorful vaginally as needed. ) 42.5 g 12   FLUoxetine (PROZAC) 20 MG capsule Take 1 capsule (20 mg total) by mouth daily. 90 capsule 3   lidocaine-prilocaine (EMLA) cream Apply to affected area once (Patient taking differently: Apply 1 application topically daily as needed (port access). ) 30 g 3   loratadine (CLARITIN) 10 MG tablet Take 10 mg by mouth daily as needed for allergies.     ondansetron (ZOFRAN) 8 MG tablet Take 8 mg by mouth every 8 (eight) hours as needed for nausea or vomiting.      oxyCODONE 10 MG TABS Take 1 tablet (10 mg total) by mouth every 4 (four) hours as needed for severe pain. 60 tablet 0   polyethylene glycol (MIRALAX / GLYCOLAX) 17 g packet Take 17 g by mouth 2 (two) times daily.     prochlorperazine (COMPAZINE) 10 MG tablet Take 10 mg by mouth every 6 (six) hours as needed for nausea or vomiting.      sennosides-docusate sodium (SENOKOT-S)  8.6-50 MG tablet Take 2 tablets by mouth in the morning and at bedtime.     simvastatin (ZOCOR) 40 MG tablet Take 1 tablet (40 mg total) by mouth at bedtime. 90 tablet 4    PHYSICAL EXAMINATION: ECOG PERFORMANCE STATUS: 1 - Symptomatic but completely ambulatory  Vitals:   03/03/20 0142 03/03/20 0645  BP: (!) 162/83 (!) 174/92  Pulse: 83 89  Resp: 18 18  Temp:    SpO2: 98% 100%   Filed Weights   03/02/20 0843  Weight: 85 kg    GENERAL:alert, no distress and comfortable ABD: Positive bowel sounds in all 4 quadrants, soft, no tenderness with palpation this morning NEURO: alert & oriented x 3 with fluent speech, no focal motor/sensory deficits  LABORATORY DATA:  I have reviewed the data as listed    Component Value Date/Time   NA  131 (L) 03/02/2020 1015   NA 137 10/27/2011 0738   K 3.7 03/02/2020 1015   K 4.1 10/27/2011 0738   CL 93 (L) 03/02/2020 1015   CL 105 10/27/2011 0738   CO2 23 03/02/2020 1015   CO2 26 10/27/2011 0738   GLUCOSE 107 (H) 03/02/2020 1015   GLUCOSE 111 (H) 10/27/2011 0738   BUN 8 03/02/2020 1015   BUN 15 10/27/2011 0738   CREATININE 0.96 03/03/2020 0638   CREATININE 0.96 02/26/2020 1406   CREATININE 1.01 (H) 12/16/2015 1030   CALCIUM 9.5 03/02/2020 1015   CALCIUM 10.0 09/28/2013 0000   PROT 7.7 03/02/2020 1015   PROT 8.1 10/27/2011 0738   ALBUMIN 3.8 03/02/2020 1015   ALBUMIN 3.9 10/27/2011 0738   AST 23 03/02/2020 1015   AST 20 02/26/2020 1406   ALT 15 03/02/2020 1015   ALT 20 02/26/2020 1406   ALT 29 10/27/2011 0738   ALKPHOS 71 03/02/2020 1015   ALKPHOS 102 10/27/2011 0738   BILITOT 0.6 03/02/2020 1015   BILITOT 0.3 02/26/2020 1406   GFRNONAA >60 03/03/2020 0638   GFRNONAA >60 02/26/2020 1406   GFRNONAA 59 (L) 12/16/2015 1030   GFRAA >60 02/18/2020 1306   GFRAA 68 12/16/2015 1030    No results found for: SPEP, UPEP  Lab Results  Component Value Date   WBC 9.0 03/03/2020   NEUTROABS 6.8 03/02/2020   HGB 9.9 (L) 03/03/2020    HCT 29.8 (L) 03/03/2020   MCV 92.8 03/03/2020   PLT 491 (H) 03/03/2020      Chemistry      Component Value Date/Time   NA 131 (L) 03/02/2020 1015   NA 137 10/27/2011 0738   K 3.7 03/02/2020 1015   K 4.1 10/27/2011 0738   CL 93 (L) 03/02/2020 1015   CL 105 10/27/2011 0738   CO2 23 03/02/2020 1015   CO2 26 10/27/2011 0738   BUN 8 03/02/2020 1015   BUN 15 10/27/2011 0738   CREATININE 0.96 03/03/2020 0638   CREATININE 0.96 02/26/2020 1406   CREATININE 1.01 (H) 12/16/2015 1030   GLU 98 09/28/2013 0000      Component Value Date/Time   CALCIUM 9.5 03/02/2020 1015   CALCIUM 10.0 09/28/2013 0000   ALKPHOS 71 03/02/2020 1015   ALKPHOS 102 10/27/2011 0738   AST 23 03/02/2020 1015   AST 20 02/26/2020 1406   ALT 15 03/02/2020 1015   ALT 20 02/26/2020 1406   ALT 29 10/27/2011 0738   BILITOT 0.6 03/02/2020 1015   BILITOT 0.3 02/26/2020 1406       RADIOGRAPHIC STUDIES: I have reviewed her CT imaging myself I have personally reviewed the radiological images as listed and agreed with the findings in the report. CT Abdomen Pelvis W Contrast  Result Date: 03/02/2020 CLINICAL DATA:  Right lower quadrant abdominal pain and sensation of pelvic pressure. History of ovarian cancer. EXAM: CT ABDOMEN AND PELVIS WITH CONTRAST TECHNIQUE: Multidetector CT imaging of the abdomen and pelvis was performed using the standard protocol following bolus administration of intravenous contrast. CONTRAST:  156mL OMNIPAQUE IOHEXOL 300 MG/ML  SOLN COMPARISON:  02/21/2020 FINDINGS: Lower chest: 4 mm calcified granuloma in the right lower lobe on image 2 of series 4. 3 mm pleural-based nodule along the right hemidiaphragm on image 7 of series 4 is stable. Other calcified granulomas are present at the right lung base. Mild linear scarring peripherally at the left lung base is unchanged. Left lower lobe granulomas are also present. Small type  1 hiatal hernia. 0.7 cm lymph node in the right pericardial space on image 11  of series 2, previously 0.5 cm. Hepatobiliary: Trace perihepatic ascites is new.  Cholecystectomy. Pancreas: Unremarkable Spleen: Old granulomatous disease. Adrenals/Urinary Tract: Both adrenal glands appear normal. Worsened right hydronephrosis and hydroureter extending down to a region of extrinsic compression on the ureter just past the iliac vessel cross over. Invasion of the ureter by the pelvic mass is not excluded. The degree of hydronephrosis is now moderate to prominent, likely with some minimal delay in excretion contrast medium. Consider urology consultation for potential stenting or percutaneous nephrostomy. The left kidney appears normal. The urinary bladder is effaced by the pelvic mass. Stomach/Bowel: Small type 1 hiatal hernia. There is prominence of stool throughout the colon extending down to the sigmoid colon. The sigmoid colon itself is effaced by the large pelvic mass and flattened by the mass, and difficult to separate from the margins of the mass. Obstruction or invasion of the sigmoid colon by the mass is a distinct possibility. Vascular/Lymphatic: Aortoiliac atherosclerotic vascular disease. Portacaval node 1.2 cm in short axis on image 22 of series 2, previously 0.9 cm. Stable 0.7 cm left common iliac node on image 48 of series 2. Reproductive: Large multilobular pelvic mass or abscess is identified. The posterior portion measures 11.3 by 7.2 by 8.1 cm (volume = 350 cm^3) today and previously measured 9.4 by 6.0 by 7.0 cm (volume = 210 cm^3) ten days ago. The more anterior portion measures about 9.0 by 5.0 by 11.1 cm (volume = 260 cm^3) today and previously measured about 5.9 by 3.3 by 6.8 cm (volume = 69 cm^3) ten days ago. These collections appear continuous and sits just above the vaginal cuff. Other: As noted above, there is trace perihepatic ascites, increased from prior. Stable mild presacral edema. Musculoskeletal: Stable rectus diastasis with a small amount of the transverse colon  bulging into the diastatic region. Stable lumbar spondylosis and degenerative disc disease. Right hip prosthesis noted. IMPRESSION: 1. Rapid enlargement of complex pelvic mass lesion over the last 10 days, causing increased obstruction of the adherent sigmoid colon and increased obstruction of the right ureter. Possibilities include rapidly growing tumor or abscess. It would be somewhat unusual for abscess to cause this degree of ureteral and bowel obstruction. 2. Trace perihepatic ascites, increased from prior. 3. Other imaging findings of potential clinical significance: Small type 1 hiatal hernia. Stable rectus diastasis with a small amount of the transverse colon bulging into the diastatic region. Stable lumbar spondylosis and degenerative disc disease. 4. Aortic atherosclerosis. Aortic Atherosclerosis (ICD10-I70.0). Electronically Signed   By: Gaylyn Rong M.D.   On: 03/02/2020 12:55   CT ABDOMEN PELVIS W CONTRAST  Result Date: 02/21/2020 CLINICAL DATA:  Ovarian cancer restaging. EXAM: CT ABDOMEN AND PELVIS WITH CONTRAST TECHNIQUE: Multidetector CT imaging of the abdomen and pelvis was performed using the standard protocol following bolus administration of intravenous contrast. CONTRAST:  OMNIPAQUE IOHEXOL 300 MG/ML  SOLN COMPARISON:  Multiple exams, including 10/10/2019 FINDINGS: Lower chest: Stable calcified granulomas in both lower lobes. Small type 1 hiatal hernia. Hepatobiliary: Cholecystectomy. Common bile duct measures 0.7 cm in diameter, borderline prominent but likely a physiologic response to cholecystectomy. No intrahepatic biliary dilatation. Pancreas: Unremarkable Spleen: Punctate calcifications compatible with old granulomatous disease. Adrenals/Urinary Tract: Mild right hydronephrosis and hydroureter extending down to the level of the recurrent pelvic mass. Distal to the iliac vessel cross over the right ovary is obscured by the mass, probably from either  extrinsic mass effect or  ureteral invasion by the mass. Visualization of the distal ureter and parts of the urinary bladder also limited by the streak artifact from the right total hip prosthesis. No significant delayed nephrogram. The left kidney and adrenal glands appear normal. Stomach/Bowel: Sigmoid diverticulosis along with indistinctness of the margins of the distal sigmoid colon suggesting inflammation. Close association of the distal sigmoid colon and anterior rectum with the pelvic mass. Prominence of stool proximal to the sigmoid colon possibly from constipation or low-grade partial obstruction. No dilated small bowel. Vascular/Lymphatic: Aortoiliac atherosclerotic vascular disease. Right external iliac node 0.7 cm in short axis on image 58 series 2, previously 0.4 cm. Reproductive: Complex cystic mass versus less likely complex abscess along the vaginal cuff and extending anterior to the rectum and along the right lateral side of the rectosigmoid junction, with components of the process extending up towards the adnexa, right greater than left. No internal gas density. Involved region measures proximally 9.0 by 8.5 by 10.9 cm with complex internal elements. Other: Mildly increased presacral edema along with mild stranding tracking in the perirectal space and into the lower retroperitoneum. Musculoskeletal: Right total hip prosthesis. Lumbar spondylosis and degenerative disc disease causing impingement at L3-4, L4-5, and L5-S1. Mild rectus diastasis with transverse colon and adipose tissue extending along the diastatic region. IMPRESSION: 1. Complex cystic mass versus less likely complex abscess along the vaginal cuff and extending anterior to the rectum and along the right lateral side of the rectosigmoid junction, with components of the process extending up towards the adnexa, right greater than left. This measures up to 10.9 cm in diameter. If this represents an abscess I would expect the patient have striking symptoms (fever,  pain, etc.). Given the patient's history, the appearance tends to favor recurrent ovarian malignancy over abscess. 2. Mild right hydronephrosis and hydroureter extending down to the level of the recurrent pelvic mass, indicating some low-grade obstruction. 3. There is sigmoid colon diverticulosis along with some stranding and wall indistinctness of the sigmoid colon which could reflect diverticulitis or colitis. Assessment is made more complex by the proximity of the pelvic mass. 4. Prominence of stool proximal to the sigmoid colon possibly from constipation or low-grade partial obstruction. 5. Other imaging findings of potential clinical significance: Small type 1 hiatal hernia. Old granulomatous disease. Lumbar spondylosis and degenerative disc disease causing impingement at L3-4, L4-5, and L5-S1. Mild rectus diastasis with transverse colon and adipose tissue extending along the diastatic region. 6. Aortic atherosclerosis. Aortic Atherosclerosis (ICD10-I70.0). Electronically Signed   By: Van Clines M.D.   On: 02/21/2020 09:53   DG BONE DENSITY (DXA)  Result Date: 02/08/2020 EXAM: DUAL X-RAY ABSORPTIOMETRY (DXA) FOR BONE MINERAL DENSITY IMPRESSION: Referring Physician:  Michael Boston Your patient completed a BMD test using Lunar IDXA DXA system ( analysis version: 16 ) manufactured by [employer]. Technologist: AW PATIENT: Name: Meghin, Thivierge Patient ID: [id number] Birth Date: 18-Nov-1950 Height: 66.0 in. Sex: Female Measured: 02/08/2020 Weight: 190.2 lbs. Indications: Bilateral Ovariectomy (65.51), Caucasian, Estrogen Deficient, Hysterectomy, Postmenopausal, Prozac, Right hip replacement Fractures: None Treatments: Vitamin D (E933.5) ASSESSMENT: The BMD measured at Forearm Radius 33% is 0.872 g/cm2 with a T-score of -0.2. This patient is considered normal according to [facility]) criteria. The scan quality is good. L-3 and L-4 were excluded due to degenerative changes.Right femur  was excluded due to surgical hardware. Site Region Measured Date Measured Age YA BMD Significant CHANGE T-score Left Forearm Radius 33% 3/08/[age] 67.9 -  0.2 0.872 g/cm2 AP Spine L1-L2 02/08/[age] 67.9 2.3 1.423 g/cm2 Left Femur Neck 02/08/[age] 67.9 1.3 1.206 g/cm2 Left Femur Total 02/08/2020 67.9 1.5 1.182 g/cm2 World Health [facility]) criteria for post-menopausal, Caucasian Women: Normal       T-score at or above -1 SD Osteopenia   T-score between -1 and -2.5 SD Osteoporosis T-score at or below -2.5 SD RECOMMENDATION: 1. All patients should optimize calcium and vitamin D intake. 2. Consider FDA approved medical therapies in postmenopausal women and men aged 23 years and older, based on the following: a. A hip or vertebral (clinical or morphometric) fracture b. T- score < or = -2.5 at the femoral neck or spine after appropriate evaluation to exclude secondary causes c. Low bone mass (T-score between -1.0 and -2.5 at the femoral neck or spine) and a 10 year probability of a hip fracture > or = 3% or a 10 year probability of a major osteoporosis-related fracture > or = 20% based on the [place]-adapted WHO algorithm d. Clinician judgment and/or patient preferences may indicate treatment for people with 10-year fracture probabilities above or below these levels FOLLOW-UP: Patients with diagnosis of osteoporosis or at high risk for fracture should have regular bone mineral density tests. For patients eligible for Medicare, routine testing is allowed once every 2 years. The testing frequency can be increased to one year for patients who have rapidly progressing disease, those who are receiving or discontinuing medical therapy to restore bone mass, or have additional risk factors. I have reviewed this report and agree with the above findings. [facility] Radiology Electronically Signed   By: Bretta Bang III M.D.   On: 02/08/2020 14:20   DG Abd 2 Views  Result Date: 02/18/2020 CLINICAL DATA:  Abdominal pain,  history of ovarian carcinoma EXAM: ABDOMEN - 2 VIEW COMPARISON:  10/10/2019 FINDINGS: Scattered large and small bowel gas is noted. No abnormal mass or abnormal calcifications are seen. Changes of prior right hip replacement and cholecystectomy are noted. Mild degenerative changes of lumbar spine are seen. IMPRESSION: No acute abnormality noted. Electronically Signed   By: Alcide Clever M.D.   On: 02/18/2020 21:13   ECHOCARDIOGRAM COMPLETE  Result Date: 02/27/2020    ECHOCARDIOGRAM REPORT   Patient Name:   Connie Heath Date of Exam: 02/27/2020 Medical Rec #:  [id number]        Height:       67.0 in Accession #:    [id number]       Weight:       187.2 lb Date of Birth:  Jul 06, 1950       BSA:          1.966 m Patient Age:    69 years         BP:           162/82 mmHg Patient Gender: F                HR:           77 bpm. Exam Location:  Outpatient Procedure: 2D Echo, Color Doppler, Cardiac Doppler and Strain Analysis Indications:    Pre-Chemo Evaluation v58.11  History:        Patient has no prior history of Echocardiogram examinations.                 Risk Factors:Hypertension and Dyslipidemia.  Sonographer:    Irving Burton Senior RDCS Referring Phys: [phone] [place]av[place] IMPRESSIONS  1. Left ventricular ejection fraction, by estimation, is 60  to 65%. The left ventricle has normal function. The left ventricle has no regional wall motion abnormalities. Left ventricular diastolic parameters are consistent with Grade I diastolic dysfunction (impaired relaxation). The average left ventricular global longitudinal strain is -18.3 %.  2. Right ventricular systolic function is normal. The right ventricular size is normal. There is normal pulmonary artery systolic pressure. The estimated right ventricular systolic pressure is 40.9 mmHg.  3. The mitral valve is normal in structure. Trivial mitral valve regurgitation.  4. The aortic valve is tricuspid. Aortic valve regurgitation is mild. No aortic stenosis is present.  5. The  inferior vena cava is normal in size with greater than 50% respiratory variability, suggesting right atrial pressure of 3 mmHg. FINDINGS  Left Ventricle: Left ventricular ejection fraction, by estimation, is 60 to 65%. The left ventricle has normal function. The left ventricle has no regional wall motion abnormalities. The average left ventricular global longitudinal strain is -18.3 %. The left ventricular internal cavity size was normal in size. There is no left ventricular hypertrophy. Left ventricular diastolic parameters are consistent with Grade I diastolic dysfunction (impaired relaxation). Right Ventricle: The right ventricular size is normal. Right vetricular wall thickness was not assessed. Right ventricular systolic function is normal. There is normal pulmonary artery systolic pressure. The tricuspid regurgitant velocity is 2.46 m/s, and with an assumed right atrial pressure of 3 mmHg, the estimated right ventricular systolic pressure is 73.5 mmHg. Left Atrium: Left atrial size was normal in size. Right Atrium: Right atrial size was normal in size. Pericardium: Trivial pericardial effusion is present. Mitral Valve: The mitral valve is normal in structure. Trivial mitral valve regurgitation. Tricuspid Valve: The tricuspid valve is normal in structure. Tricuspid valve regurgitation is mild. Aortic Valve: The aortic valve is tricuspid. Aortic valve regurgitation is mild. No aortic stenosis is present. Pulmonic Valve: The pulmonic valve was not well visualized. Pulmonic valve regurgitation is trivial. Aorta: The aortic root and ascending aorta are structurally normal, with no evidence of dilitation. Venous: The inferior vena cava is normal in size with greater than 50% respiratory variability, suggesting right atrial pressure of 3 mmHg. IAS/Shunts: No atrial level shunt detected by color flow Doppler.  LEFT VENTRICLE PLAX 2D LVIDd:         4.40 cm  Diastology LVIDs:         2.50 cm  LV e' medial:    0.06 cm/s  LV PW:         0.80 cm  LV E/e' medial:  11.2 LV IVS:        0.80 cm  LV e' lateral:   0.06 cm/s LVOT diam:     1.90 cm  LV E/e' lateral: 11.0 LV SV:         45 LV SV Index:   23       2D Longitudinal Strain LVOT Area:     2.84 cm 2D Strain GLS Avg:     -18.3 %  RIGHT VENTRICLE RV S prime:     14.80 cm/s TAPSE (M-mode): 1.8 cm LEFT ATRIUM             Index       RIGHT ATRIUM           Index LA diam:        3.20 cm 1.63 cm/m  RA Area:     11.20 cm LA Vol (A2C):   23.5 ml 11.95 ml/m RA Volume:   23.90 ml  12.16 ml/m LA Vol (  A4C):   40.5 ml 20.60 ml/m LA Biplane Vol: 31.9 ml 16.22 ml/m  AORTIC VALVE LVOT Vmax:   78.30 cm/s LVOT Vmean:  52.400 cm/s LVOT VTI:    0.160 m  AORTA Ao Root diam: 3.20 cm Ao Asc diam:  3.10 cm MITRAL VALVE               TRICUSPID VALVE MV Area (PHT): 3.17 cm    TR Peak grad:   24.2 mmHg MV Decel Time: 239 msec    TR Vmax:        246.00 cm/s MV E velocity: 0.70 cm/s MV A velocity: 83.90 cm/s  SHUNTS MV E/A ratio:  0.01        Systemic VTI:  0.16 m                            Systemic Diam: 1.90 cm Oswaldo Milian MD Electronically signed by Oswaldo Milian MD Signature Date/Time: 02/27/2020/11:21:25 AM    Final

## 2020-03-03 NOTE — ED Notes (Signed)
Patient ambulatory to restroom independently with no distress noted. Will admin medication for pain when patient returns. Husband in room visiting.

## 2020-03-03 NOTE — Assessment & Plan Note (Signed)
-   pain regimen in place; will further adjust as needed - continue dilaudid and norco PRN

## 2020-03-03 NOTE — Assessment & Plan Note (Signed)
-   appreciate oncology assistance - patient has elected for DNR; prognosis is poor and treatment is palliative

## 2020-03-03 NOTE — ED Notes (Signed)
Patient resting in bed, bed in low position, call bell in reach.

## 2020-03-03 NOTE — Progress Notes (Signed)
PROGRESS NOTE    Connie Heath   WUJ:[id number]  DOB: [birth year]  DOA: 03/02/2020     1  PCP: Michael Boston, MD  CC:  Abdominal pain  Hospital Course: Ms. 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 Alvy Bimler, most recent appointment on 10/12), cancer related pain, anemia of chronic disease, right-sided hydronephrosis, depression, hyperlipidemia, hypertension who presented to the ED with worsening RLQ abdominal pain, rectal pain and constipation.   Per Dr. Calton Dach note on 10/5, the patient had new onset abdominal pain and flank pain and was found to have a UTI and started on Omnicef, given a prescription of oxycodone as needed and a CT scan was ordered.  CT scan at that time was concerning for either cancer recurrence or pelvic abscess though an abscess was less likely.  Plan was to start on palliative chemotherapy with doxorubicin and a baseline echo was ordered (plan to start chemo on Tuesday).    She now presented with having right lower quadrant pain which is different than her normal pain that is severe and does not radiate anywhere and is constant.  Constipation has not improved with senna and MiraLAX.  Pain started 3-5 days PTA and she has not had a BM in the past 1-2 days.  Has decreased appetite.  Is passing a small amount of flatus.  Also with rectal pain which is her more significant symptom and has had nausea without vomiting.  Denies fever, chills, dysuria, hematuria, vaginal pain, vaginal discharge or bleeding, chest pain.  Patient has had a hysterectomy and oophorectomy and cholecystectomy.   ED Course: Afebrile, tachycardic and hypertensive and tolerating room air.  Labs notable for sodium 131, chloride 93, Hb 10.5 (at baseline), UA negative.  CT abdomen pelvis most notable for rapid enlargement  of complex pelvic mass since most recent CT scan with increased obstruction of the adherent sigmoid colon and increased obstruction of the right ureter, trace hepatic ascites.  She was given Dilaudid, Zofran and 1 L NS bolus.  Oncology was consulted on admission.  Her case was reviewed as well as CT findings.  Given obstruction of the sigmoid colon due to her mass, it was recommended for surgery to consult on the case and weigh in on need for diversion colostomy prior to initiation of chemotherapy for further tumor shrinking.     Interval History:  Seen in ER this am, still awaiting a bed. Still having pain, no flatus, no BM but is comfortable. Husband bedside in ER.   Old records reviewed in assessment of this patient  ROS: Constitutional: negative for chills and fevers, Respiratory: negative for cough, Cardiovascular: negative for chest pain and Gastrointestinal: positive for abdominal pain  Assessment & Plan: * Bowel obstruction (HCC) -CT showing progressive enlargement of pelvic mass from prior imaging, now with obstruction on adherent sigmoid colon -Patient has been evaluated by oncology with recommendations for surgery consult for probable colostomy to relieve obstruction prior to initiation of chemotherapy for further tumor shrinking -Follow-up surgery consult -Continue pain and nausea control -Continue holding off on antibiotics at this time  Ovarian cancer on right [facility]) - appreciate oncology assistance - patient has elected for DNR; prognosis is poor and treatment is palliative   Hydronephrosis of right kidney - due to mass compression  - on admission case discussed with urology;  oncology has also discussed -For now, we will continue to monitor renal function and try to avoid stent placement -Trend BMP  Cancer related pain - pain regimen in place; will further adjust as needed - continue dilaudid and norco PRN  Anemia, chronic disease - per oncology, transfuse for  Hgb<8 g/dL - has had recent outpatient workup with oncology; mostly IDA, low/normal B12 as well  Constipation - follow up surgery consult - increased risk for OIC  Depression -Continue Prozac and Xanax    Antimicrobials: none  DVT prophylaxis: Lovenox Code Status: DNR Family Communication: husband bedside Disposition Plan: Status is: Inpatient  Remains inpatient appropriate because:Ongoing diagnostic testing needed not appropriate for outpatient work up, Unsafe d/c plan, IV treatments appropriate due to intensity of illness or inability to take PO and Inpatient level of care appropriate due to severity of illness   Dispo: The patient is from: Home              Anticipated d/c is to: Home              Anticipated d/c date is: 3 days              Patient currently is not medically stable to d/c.       Objective: Blood pressure (!) 143/85, pulse 71, temperature 98 F (36.7 C), temperature source Oral, resp. rate 20, height 5\' 7"  (1.702 m), weight 85 kg, SpO2 95 %.  Examination: General appearance: alert, cooperative and mildly uncomfortable appearing Head: Normocephalic, without obvious abnormality, atraumatic Eyes: EOMI Lungs: clear to auscultation bilaterally Heart: regular rate and rhythm and S1, S2 normal Abdomen: soft, TTP in lower abdomen, no R/G, BS minimal Extremities: trace LE edema Skin: mobility and turgor normal Neurologic: Grossly normal  Consultants:   Oncology  Surgery  Procedures:   n/a  Data Reviewed: I have personally reviewed following labs and imaging studies Results for orders placed or performed during the hospital encounter of 03/02/20 (from the past 24 hour(s))  Respiratory Panel by RT PCR (Flu A&B, Covid) - Nasopharyngeal Swab     Status: None   Collection Time: 03/02/20  3:04 PM   Specimen: Nasopharyngeal Swab  Result Value Ref Range   SARS Coronavirus 2 by RT PCR NEGATIVE NEGATIVE   Influenza A by PCR NEGATIVE NEGATIVE    Influenza B by PCR NEGATIVE NEGATIVE  HIV Antibody (routine testing w rflx)     Status: None   Collection Time: 03/03/20  6:38 AM  Result Value Ref Range   HIV Screen 4th Generation wRfx Non Reactive Non Reactive  Creatinine, serum     Status: None   Collection Time: 03/03/20  6:38 AM  Result Value Ref Range   Creatinine, Ser 0.96 0.44 - 1.00 mg/dL   GFR, Estimated >60 >60 mL/min  CBC     Status: Abnormal   Collection Time: 03/03/20  6:38 AM  Result Value Ref Range   WBC 9.0 4.0 - 10.5 K/uL   RBC 3.21 (L) 3.87 - 5.11 MIL/uL   Hemoglobin 9.9 (L) 12.0 - 15.0 g/dL   HCT 29.8 (L) 36 - 46 %   MCV 92.8 80.0 - 100.0 fL   MCH 30.8 26.0 - 34.0 pg   MCHC 33.2 30.0 - 36.0 g/dL   RDW 13.6 11.5 - 15.5 %   Platelets 491 (H) 150 - 400 K/uL   nRBC 0.0 0.0 - 0.2 %    Recent Results (from the past 240 hour(s))  Respiratory Panel by  RT PCR (Flu A&B, Covid) - Nasopharyngeal Swab     Status: None   Collection Time: 03/02/20  3:04 PM   Specimen: Nasopharyngeal Swab  Result Value Ref Range Status   SARS Coronavirus 2 by RT PCR NEGATIVE NEGATIVE Final    Comment: (NOTE) SARS-CoV-2 target nucleic acids are NOT DETECTED.  The SARS-CoV-2 RNA is generally detectable in upper respiratoy specimens during the acute phase of infection. The lowest concentration of SARS-CoV-2 viral copies this assay can detect is 131 copies/mL. A negative result does not preclude SARS-Cov-2 infection and should not be used as the sole basis for treatment or other patient management decisions. A negative result may occur with  improper specimen collection/handling, submission of specimen other than nasopharyngeal swab, presence of viral mutation(s) within the areas targeted by this assay, and inadequate number of viral copies (<131 copies/mL). A negative result must be combined with clinical observations, patient history, and epidemiological information. The expected result is Negative.  Fact Sheet for Patients:    PinkCheek.be  Fact Sheet for Healthcare Providers:  GravelBags.it  This test is no t yet approved or cleared by the [place] FDA and  has been authorized for detection and/or diagnosis of SARS-CoV-2 by FDA under an Emergency Use Authorization (EUA). This EUA will remain  in effect (meaning this test can be used) for the duration of the COVID-19 declaration under Section 564(b)(1) of the Act, 21 U.S.C. section 360bbb-3(b)(1), unless the authorization is terminated or revoked sooner.     Influenza A by PCR NEGATIVE NEGATIVE Final   Influenza B by PCR NEGATIVE NEGATIVE Final    Comment: (NOTE) The Xpert Xpress SARS-CoV-2/FLU/RSV assay is intended as an aid in  the diagnosis of influenza from Nasopharyngeal swab specimens and  should not be used as a sole basis for treatment. Nasal washings and  aspirates are unacceptable for Xpert Xpress SARS-CoV-2/FLU/RSV  testing.  Fact Sheet for Patients: PinkCheek.be  Fact Sheet for Healthcare Providers: GravelBags.it  This test is not yet approved or cleared by the [place] FDA and  has been authorized for detection and/or diagnosis of SARS-CoV-2 by  FDA under an Emergency Use Authorization (EUA). This EUA will remain  in effect (meaning this test can be used) for the duration of the  Covid-19 declaration under Section 564(b)(1) of the Act, 21  U.S.C. section 360bbb-3(b)(1), unless the authorization is  terminated or revoked. Performed at [facility], [place] [address], [place], [place] [address]      Radiology Studies: CT Abdomen Pelvis W Contrast  Result Date: 03/02/2020 CLINICAL DATA:  Right lower quadrant abdominal pain and sensation of pelvic pressure. History of ovarian cancer. EXAM: CT ABDOMEN AND PELVIS WITH CONTRAST TECHNIQUE: Multidetector CT imaging of the abdomen and pelvis was  performed using the standard protocol following bolus administration of intravenous contrast. CONTRAST:  181mL OMNIPAQUE IOHEXOL 300 MG/ML  SOLN COMPARISON:  02/21/2020 FINDINGS: Lower chest: 4 mm calcified granuloma in the right lower lobe on image 2 of series 4. 3 mm pleural-based nodule along the right hemidiaphragm on image 7 of series 4 is stable. Other calcified granulomas are present at the right lung base. Mild linear scarring peripherally at the left lung base is unchanged. Left lower lobe granulomas are also present. Small type 1 hiatal hernia. 0.7 cm lymph node in the right pericardial space on image 11 of series 2, previously 0.5 cm. Hepatobiliary: Trace perihepatic ascites is new.  Cholecystectomy. Pancreas: Unremarkable Spleen: Old granulomatous disease. Adrenals/Urinary Tract:  Both adrenal glands appear normal. Worsened right hydronephrosis and hydroureter extending down to a region of extrinsic compression on the ureter just past the iliac vessel cross over. Invasion of the ureter by the pelvic mass is not excluded. The degree of hydronephrosis is now moderate to prominent, likely with some minimal delay in excretion contrast medium. Consider urology consultation for potential stenting or percutaneous nephrostomy. The left kidney appears normal. The urinary bladder is effaced by the pelvic mass. Stomach/Bowel: Small type 1 hiatal hernia. There is prominence of stool throughout the colon extending down to the sigmoid colon. The sigmoid colon itself is effaced by the large pelvic mass and flattened by the mass, and difficult to separate from the margins of the mass. Obstruction or invasion of the sigmoid colon by the mass is a distinct possibility. Vascular/Lymphatic: Aortoiliac atherosclerotic vascular disease. Portacaval node 1.2 cm in short axis on image 22 of series 2, previously 0.9 cm. Stable 0.7 cm left common iliac node on image 48 of series 2. Reproductive: Large multilobular pelvic mass or  abscess is identified. The posterior portion measures 11.3 by 7.2 by 8.1 cm (volume = 350 cm^3) today and previously measured 9.4 by 6.0 by 7.0 cm (volume = 210 cm^3) ten days ago. The more anterior portion measures about 9.0 by 5.0 by 11.1 cm (volume = 260 cm^3) today and previously measured about 5.9 by 3.3 by 6.8 cm (volume = 69 cm^3) ten days ago. These collections appear continuous and sits just above the vaginal cuff. Other: As noted above, there is trace perihepatic ascites, increased from prior. Stable mild presacral edema. Musculoskeletal: Stable rectus diastasis with a small amount of the transverse colon bulging into the diastatic region. Stable lumbar spondylosis and degenerative disc disease. Right hip prosthesis noted. IMPRESSION: 1. Rapid enlargement of complex pelvic mass lesion over the last 10 days, causing increased obstruction of the adherent sigmoid colon and increased obstruction of the right ureter. Possibilities include rapidly growing tumor or abscess. It would be somewhat unusual for abscess to cause this degree of ureteral and bowel obstruction. 2. Trace perihepatic ascites, increased from prior. 3. Other imaging findings of potential clinical significance: Small type 1 hiatal hernia. Stable rectus diastasis with a small amount of the transverse colon bulging into the diastatic region. Stable lumbar spondylosis and degenerative disc disease. 4. Aortic atherosclerosis. Aortic Atherosclerosis (ICD10-I70.0). Electronically Signed   By: Van Clines M.D.   On: 03/02/2020 12:55   CT Abdomen Pelvis W Contrast  Final Result      Scheduled Meds: . enoxaparin (LOVENOX) injection  40 mg Subcutaneous Q24H  . FLUoxetine  20 mg Oral Daily  . polyethylene glycol  17 g Oral BID  . simvastatin  40 mg Oral QHS  . triamcinolone cream   Topical BID   PRN Meds: acetaminophen **OR** acetaminophen, ALPRAZolam, diphenhydrAMINE-zinc acetate, HYDROcodone-acetaminophen, HYDROmorphone (DILAUDID)  injection, ondansetron **OR** ondansetron (ZOFRAN) IV Continuous Infusions:    LOS: 1 day  Time spent: Greater than 50% of the 35 minute visit was spent in counseling/coordination of care for the patient as laid out in the A&P.   Dwyane Dee, MD [facility] 03/03/2020, 2:05 PM

## 2020-03-03 NOTE — ED Notes (Signed)
Patients husband in room

## 2020-03-03 NOTE — Progress Notes (Signed)
Requested PD-L1 testing on accession [id number] with [facility] via email. 

## 2020-03-03 NOTE — ED Notes (Signed)
Pt ambulated to BR without assistance

## 2020-03-03 NOTE — Assessment & Plan Note (Signed)
-  Continue Prozac and Xanax

## 2020-03-03 NOTE — Assessment & Plan Note (Addendum)
-  CT showing progressive enlargement of pelvic mass from prior imaging, now with obstruction on adherent sigmoid colon -Patient has been evaluated by oncology with recommendations for diverting colostomy to relieve obstruction prior to initiation of chemotherapy for further tumor shrinking -Continue pain and nausea control -Continue holding off on antibiotics at this time -Tentative plan is for diverting colostomy on 03/04/2020.  After surgery patient will remain on GYN oncology service as primary service

## 2020-03-03 NOTE — ED Notes (Signed)
Patient ambulating to restroom indecently with no distress noted. Call bell in reach on return and bed in low position.

## 2020-03-03 NOTE — Assessment & Plan Note (Addendum)
-   per oncology, transfuse for Hgb<8 g/dL - has had recent outpatient workup with oncology; mostly IDA, low/normal B12 as well

## 2020-03-03 NOTE — ED Notes (Signed)
Ambulated to the bathroom 

## 2020-03-03 NOTE — ED Notes (Signed)
MD in room

## 2020-03-03 NOTE — Assessment & Plan Note (Addendum)
-   now plan is for diverting colostomy

## 2020-03-03 NOTE — Plan of Care (Signed)
  Problem: Clinical Measurements: Goal: Will remain free from infection Outcome: Progressing   Problem: Nutrition: Goal: Adequate nutrition will be maintained Outcome: Progressing   Problem: Coping: Goal: Level of anxiety will decrease Outcome: Progressing   Problem: Elimination: Goal: Will not experience complications related to bowel motility Outcome: Progressing   Problem: Safety: Goal: Ability to remain free from injury will improve Outcome: Progressing

## 2020-03-03 NOTE — ED Notes (Signed)
Patient ambulated to the bathroom without assistance.

## 2020-03-03 NOTE — Progress Notes (Signed)
Received report on patient from ED nurse at 2244. Patient arrived the unit at 2050, alert and ambulatory to her bed.

## 2020-03-04 ENCOUNTER — Inpatient Hospital Stay (HOSPITAL_COMMUNITY): Payer: Medicare HMO | Admitting: Certified Registered Nurse Anesthetist

## 2020-03-04 ENCOUNTER — Encounter (HOSPITAL_COMMUNITY)
Admission: EM | Disposition: A | Discharge status: Home Health | Payer: Self-pay | Source: Home / Self Care | Attending: Gynecologic Oncology

## 2020-03-04 ENCOUNTER — Ambulatory Visit: Payer: Medicare HMO

## 2020-03-04 ENCOUNTER — Other Ambulatory Visit: Payer: Self-pay | Admitting: Hematology and Oncology

## 2020-03-04 DIAGNOSIS — K56609 Unspecified intestinal obstruction, unspecified as to partial versus complete obstruction: Secondary | ICD-10-CM | POA: Diagnosis not present

## 2020-03-04 DIAGNOSIS — G893 Neoplasm related pain (acute) (chronic): Secondary | ICD-10-CM | POA: Diagnosis not present

## 2020-03-04 DIAGNOSIS — N1330 Unspecified hydronephrosis: Secondary | ICD-10-CM | POA: Diagnosis not present

## 2020-03-04 DIAGNOSIS — C569 Malignant neoplasm of unspecified ovary: Secondary | ICD-10-CM | POA: Diagnosis present

## 2020-03-04 HISTORY — PX: VENTRAL HERNIA REPAIR: SHX424

## 2020-03-04 HISTORY — PX: LAPAROTOMY: SHX154

## 2020-03-04 LAB — CBC WITH DIFFERENTIAL/PLATELET
Abs Immature Granulocytes: 0.05 10*3/uL (ref 0.00–0.07)
Basophils Absolute: 0.1 10*3/uL (ref 0.0–0.1)
Basophils Relative: 1 %
Eosinophils Absolute: 0.2 10*3/uL (ref 0.0–0.5)
Eosinophils Relative: 3 %
HCT: 28.5 % — ABNORMAL LOW (ref 36.0–46.0)
Hemoglobin: 9.6 g/dL — ABNORMAL LOW (ref 12.0–15.0)
Immature Granulocytes: 1 %
Lymphocytes Relative: 26 %
Lymphs Abs: 2.1 10*3/uL (ref 0.7–4.0)
MCH: 30.7 pg (ref 26.0–34.0)
MCHC: 33.7 g/dL (ref 30.0–36.0)
MCV: 91.1 fL (ref 80.0–100.0)
Monocytes Absolute: 0.9 10*3/uL (ref 0.1–1.0)
Monocytes Relative: 11 %
Neutro Abs: 4.8 10*3/uL (ref 1.7–7.7)
Neutrophils Relative %: 58 %
Platelets: 471 10*3/uL — ABNORMAL HIGH (ref 150–400)
RBC: 3.13 MIL/uL — ABNORMAL LOW (ref 3.87–5.11)
RDW: 13.6 % (ref 11.5–15.5)
WBC: 8.2 10*3/uL (ref 4.0–10.5)
nRBC: 0 % (ref 0.0–0.2)

## 2020-03-04 LAB — BASIC METABOLIC PANEL
Anion gap: 12 (ref 5–15)
BUN: 6 mg/dL — ABNORMAL LOW (ref 8–23)
CO2: 26 mmol/L (ref 22–32)
Calcium: 9.5 mg/dL (ref 8.9–10.3)
Chloride: 97 mmol/L — ABNORMAL LOW (ref 98–111)
Creatinine, Ser: 0.94 mg/dL (ref 0.44–1.00)
GFR, Estimated: >60 mL/min (ref >60)
Glucose, Bld: 91 mg/dL (ref 70–99)
Potassium: 4.3 mmol/L (ref 3.5–5.1)
Sodium: 135 mmol/L (ref 135–145)

## 2020-03-04 LAB — SURGICAL PCR SCREEN
MRSA, PCR: NEGATIVE
Staphylococcus aureus: NEGATIVE

## 2020-03-04 LAB — TYPE AND SCREEN
ABO/RH(D): O POS
Antibody Screen: NEGATIVE

## 2020-03-04 LAB — MAGNESIUM: Magnesium: 2.2 mg/dL (ref 1.7–2.4)

## 2020-03-04 SURGERY — LAPAROTOMY, EXPLORATORY
Anesthesia: General

## 2020-03-04 MED ORDER — HYDROMORPHONE HCL 1 MG/ML IJ SOLN
INTRAMUSCULAR | Status: AC
  Administered 2020-03-04: 0.5 mg via INTRAVENOUS
  Filled 2020-03-04: qty 1

## 2020-03-04 MED ORDER — PROMETHAZINE HCL 25 MG/ML IJ SOLN: 6.2500 mg | INTRAMUSCULAR | Status: DC | PRN

## 2020-03-04 MED ORDER — ONDANSETRON HCL 4 MG/2ML IJ SOLN
INTRAMUSCULAR | Status: DC | PRN
  Administered 2020-03-04: 4 mg via INTRAVENOUS

## 2020-03-04 MED ORDER — LIDOCAINE HCL (CARDIAC) PF 100 MG/5ML IV SOSY
PREFILLED_SYRINGE | INTRAVENOUS | Status: DC | PRN
  Administered 2020-03-04: 60 mg via INTRAVENOUS

## 2020-03-04 MED ORDER — SUGAMMADEX SODIUM 200 MG/2ML IV SOLN
INTRAVENOUS | Status: DC | PRN
  Administered 2020-03-04: 170 mg via INTRAVENOUS

## 2020-03-04 MED ORDER — BUPIVACAINE HCL 0.25 % IJ SOLN
INTRAMUSCULAR | Status: DC | PRN
  Administered 2020-03-04: 10 mL

## 2020-03-04 MED ORDER — FENTANYL CITRATE (PF) 250 MCG/5ML IJ SOLN
INTRAMUSCULAR | Status: AC
  Filled 2020-03-04: qty 5

## 2020-03-04 MED ORDER — BUPIVACAINE LIPOSOME 1.3 % IJ SUSP
20.0000 mL | Freq: Once | INTRAMUSCULAR | Status: AC
  Administered 2020-03-04: 20 mL
  Filled 2020-03-04: qty 20

## 2020-03-04 MED ORDER — SUCCINYLCHOLINE CHLORIDE 200 MG/10ML IV SOSY
PREFILLED_SYRINGE | INTRAVENOUS | Status: DC | PRN
  Administered 2020-03-04: 80 mg via INTRAVENOUS

## 2020-03-04 MED ORDER — DEXAMETHASONE SODIUM PHOSPHATE 10 MG/ML IJ SOLN
INTRAMUSCULAR | Status: DC | PRN
  Administered 2020-03-04: 6 mg via INTRAVENOUS

## 2020-03-04 MED ORDER — HYDROMORPHONE HCL 1 MG/ML IJ SOLN
0.2500 mg | INTRAMUSCULAR | Status: DC | PRN
  Administered 2020-03-04 (×2): 0.5 mg via INTRAVENOUS

## 2020-03-04 MED ORDER — ONDANSETRON HCL 4 MG/2ML IJ SOLN: 4.0000 mg | Freq: Four times a day (QID) | INTRAMUSCULAR | Status: DC | PRN

## 2020-03-04 MED ORDER — SODIUM CHLORIDE 0.9% FLUSH: 10.0000 mL | INTRAVENOUS | Status: DC | PRN

## 2020-03-04 MED ORDER — SODIUM CHLORIDE 0.9 % IV SOLN
2.0000 g | INTRAVENOUS | Status: AC
  Administered 2020-03-04: 2 g via INTRAVENOUS
  Filled 2020-03-04: qty 2

## 2020-03-04 MED ORDER — HYDROMORPHONE HCL 1 MG/ML IJ SOLN
0.5000 mg | INTRAMUSCULAR | Status: DC | PRN
  Administered 2020-03-04 – 2020-03-05 (×2): 0.5 mg via INTRAVENOUS
  Filled 2020-03-04 (×2): qty 0.5

## 2020-03-04 MED ORDER — FENTANYL CITRATE (PF) 250 MCG/5ML IJ SOLN
INTRAMUSCULAR | Status: DC | PRN
Start: 2020-03-04 — End: 2020-03-04
  Administered 2020-03-04: 100 ug via INTRAVENOUS
  Administered 2020-03-04 (×3): 50 ug via INTRAVENOUS

## 2020-03-04 MED ORDER — 0.9 % SODIUM CHLORIDE (POUR BTL) OPTIME
TOPICAL | Status: DC | PRN
  Administered 2020-03-04: 2000 mL

## 2020-03-04 MED ORDER — LACTATED RINGERS IV SOLN: INTRAVENOUS | Status: DC

## 2020-03-04 MED ORDER — ENOXAPARIN SODIUM 40 MG/0.4ML ~~LOC~~ SOLN
40.0000 mg | SUBCUTANEOUS | Status: DC
  Administered 2020-03-05 – 2020-03-06 (×2): 40 mg via SUBCUTANEOUS
  Filled 2020-03-04 (×2): qty 0.4

## 2020-03-04 MED ORDER — KCL IN DEXTROSE-NACL 20-5-0.45 MEQ/L-%-% IV SOLN: INTRAVENOUS | Status: DC

## 2020-03-04 MED ORDER — PHENYLEPHRINE HCL-NACL 10-0.9 MG/250ML-% IV SOLN
INTRAVENOUS | Status: DC | PRN
  Administered 2020-03-04: 40 ug/min via INTRAVENOUS

## 2020-03-04 MED ORDER — PREGABALIN 75 MG PO CAPS
75.0000 mg | ORAL_CAPSULE | Freq: Two times a day (BID) | ORAL | Status: DC
  Administered 2020-03-04 – 2020-03-06 (×4): 75 mg via ORAL
  Filled 2020-03-04 (×4): qty 1

## 2020-03-04 MED ORDER — MIDAZOLAM HCL 2 MG/2ML IJ SOLN
INTRAMUSCULAR | Status: AC
  Filled 2020-03-04: qty 2

## 2020-03-04 MED ORDER — ROCURONIUM BROMIDE 10 MG/ML (PF) SYRINGE
PREFILLED_SYRINGE | INTRAVENOUS | Status: DC | PRN
  Administered 2020-03-04: 10 mg via INTRAVENOUS
  Administered 2020-03-04: 50 mg via INTRAVENOUS

## 2020-03-04 MED ORDER — PHENYLEPHRINE HCL (PRESSORS) 10 MG/ML IV SOLN
INTRAVENOUS | Status: AC
  Filled 2020-03-04: qty 1

## 2020-03-04 MED ORDER — CHLORHEXIDINE GLUCONATE CLOTH 2 % EX PADS
6.0000 | MEDICATED_PAD | Freq: Every day | CUTANEOUS | Status: DC
  Administered 2020-03-04: 6 via TOPICAL

## 2020-03-04 MED ORDER — OXYCODONE HCL 5 MG PO TABS
5.0000 mg | ORAL_TABLET | ORAL | Status: DC | PRN
  Administered 2020-03-04 – 2020-03-05 (×4): 5 mg via ORAL
  Filled 2020-03-04 (×4): qty 1

## 2020-03-04 MED ORDER — DICLOFENAC SODIUM 50 MG PO TBEC
50.0000 mg | DELAYED_RELEASE_TABLET | Freq: Three times a day (TID) | ORAL | Status: DC
  Administered 2020-03-04 – 2020-03-06 (×6): 50 mg via ORAL
  Filled 2020-03-04 (×7): qty 1

## 2020-03-04 MED ORDER — KETOROLAC TROMETHAMINE 15 MG/ML IJ SOLN
15.0000 mg | Freq: Once | INTRAMUSCULAR | Status: AC
  Administered 2020-03-04: 15 mg via INTRAVENOUS
  Filled 2020-03-04: qty 1

## 2020-03-04 MED ORDER — CHLORHEXIDINE GLUCONATE 0.12 % MT SOLN
15.0000 mL | Freq: Once | OROMUCOSAL | Status: AC
  Administered 2020-03-04: 15 mL via OROMUCOSAL

## 2020-03-04 MED ORDER — LIDOCAINE 2% (20 MG/ML) 5 ML SYRINGE
INTRAMUSCULAR | Status: AC
  Filled 2020-03-04: qty 5

## 2020-03-04 MED ORDER — BUPIVACAINE HCL 0.25 % IJ SOLN
INTRAMUSCULAR | Status: AC
  Filled 2020-03-04: qty 1

## 2020-03-04 MED ORDER — KCL IN DEXTROSE-NACL 20-5-0.45 MEQ/L-%-% IV SOLN
INTRAVENOUS | Status: DC
  Filled 2020-03-04 (×3): qty 1000

## 2020-03-04 MED ORDER — SODIUM CHLORIDE (PF) 0.9 % IJ SOLN
INTRAMUSCULAR | Status: AC
  Filled 2020-03-04: qty 20

## 2020-03-04 MED ORDER — MIDAZOLAM HCL 2 MG/2ML IJ SOLN
INTRAMUSCULAR | Status: DC | PRN
  Administered 2020-03-04 (×2): 1 mg via INTRAVENOUS

## 2020-03-04 MED ORDER — ROCURONIUM BROMIDE 10 MG/ML (PF) SYRINGE
PREFILLED_SYRINGE | INTRAVENOUS | Status: AC
  Filled 2020-03-04: qty 10

## 2020-03-04 MED ORDER — PHENYLEPHRINE 40 MCG/ML (10ML) SYRINGE FOR IV PUSH (FOR BLOOD PRESSURE SUPPORT)
PREFILLED_SYRINGE | INTRAVENOUS | Status: DC | PRN
  Administered 2020-03-04: 40 ug via INTRAVENOUS
  Administered 2020-03-04: 160 ug via INTRAVENOUS
  Administered 2020-03-04: 120 ug via INTRAVENOUS

## 2020-03-04 MED ORDER — PROPOFOL 10 MG/ML IV BOLUS
INTRAVENOUS | Status: AC
  Filled 2020-03-04: qty 20

## 2020-03-04 MED ORDER — ONDANSETRON HCL 4 MG PO TABS: 4.0000 mg | ORAL_TABLET | Freq: Four times a day (QID) | ORAL | Status: DC | PRN

## 2020-03-04 MED ORDER — ACETAMINOPHEN 500 MG PO TABS
1000.0000 mg | ORAL_TABLET | Freq: Two times a day (BID) | ORAL | Status: DC
  Administered 2020-03-04 – 2020-03-06 (×4): 1000 mg via ORAL
  Filled 2020-03-04 (×4): qty 2

## 2020-03-04 MED ORDER — LIP MEDEX EX OINT: TOPICAL_OINTMENT | CUTANEOUS | Status: DC | PRN

## 2020-03-04 MED ORDER — PROPOFOL 10 MG/ML IV BOLUS
INTRAVENOUS | Status: DC | PRN
  Administered 2020-03-04: 160 mg via INTRAVENOUS

## 2020-03-04 MED ORDER — LIP MEDEX EX OINT
TOPICAL_OINTMENT | CUTANEOUS | Status: AC
  Filled 2020-03-04: qty 7

## 2020-03-04 SURGICAL SUPPLY — 70 items
AGENT HMST KT MTR STRL THRMB (HEMOSTASIS)
APL PRP STRL LF DISP 70% ISPRP (MISCELLANEOUS) ×1
APL SKNCLS STERI-STRIP NONHPOA (GAUZE/BANDAGES/DRESSINGS) ×1
ATTRACTOMAT 16X20 MAGNETIC DRP (DRAPES) IMPLANT
BARRIER SKIN 2 3/4 (OSTOMY) ×2 IMPLANT
BENZOIN TINCTURE PRP APPL 2/3 (GAUZE/BANDAGES/DRESSINGS) ×2 IMPLANT
BLADE EXTENDED COATED 6.5IN (ELECTRODE) ×2 IMPLANT
BRR SKN FLT 2.75X2.25 2 PC (OSTOMY) ×1
CELLS DAT CNTRL 66122 CELL SVR (MISCELLANEOUS) ×1 IMPLANT
CHLORAPREP W/TINT 26 (MISCELLANEOUS) ×2 IMPLANT
CLEANER TIP ELECTROSURG 2X2 (MISCELLANEOUS) ×2 IMPLANT
CLIP VESOCCLUDE LG 6/CT (CLIP) ×2 IMPLANT
CLIP VESOCCLUDE MED 6/CT (CLIP) ×2 IMPLANT
CLIP VESOCCLUDE MED LG 6/CT (CLIP) ×2 IMPLANT
CLSR STERI-STRIP ANTIMIC 1/2X4 (GAUZE/BANDAGES/DRESSINGS) ×2 IMPLANT
CNTNR URN SCR LID CUP LEK RST (MISCELLANEOUS) IMPLANT
CONT SPEC 4OZ STRL OR WHT (MISCELLANEOUS)
COVER WAND RF STERILE (DRAPES) IMPLANT
DRAPE INCISE IOBAN 66X45 STRL (DRAPES) ×2 IMPLANT
DRAPE LAPAROTOMY T 102X78X121 (DRAPES) ×2 IMPLANT
DRAPE SURG IRRIG POUCH 19X23 (DRAPES) ×2 IMPLANT
DRAPE WARM FLUID 44X44 (DRAPES) ×2 IMPLANT
DRSG OPSITE POSTOP 4X10 (GAUZE/BANDAGES/DRESSINGS) IMPLANT
DRSG OPSITE POSTOP 4X6 (GAUZE/BANDAGES/DRESSINGS) ×2 IMPLANT
DRSG OPSITE POSTOP 4X8 (GAUZE/BANDAGES/DRESSINGS) IMPLANT
ELECT REM PT RETURN 15FT ADLT (MISCELLANEOUS) ×2 IMPLANT
GAUZE 4X4 16PLY RFD (DISPOSABLE) IMPLANT
GLOVE BIO SURGEON STRL SZ 6 (GLOVE) ×4 IMPLANT
GLOVE BIO SURGEON STRL SZ 6.5 (GLOVE) ×4 IMPLANT
GOWN STRL REUS W/ TWL LRG LVL3 (GOWN DISPOSABLE) ×2 IMPLANT
GOWN STRL REUS W/TWL LRG LVL3 (GOWN DISPOSABLE) ×4
HEMOSTAT ARISTA ABSORB 3G PWDR (HEMOSTASIS) IMPLANT
KIT BASIN OR (CUSTOM PROCEDURE TRAY) ×2 IMPLANT
KIT TURNOVER KIT A (KITS) IMPLANT
LIGASURE IMPACT 36 18CM CVD LR (INSTRUMENTS) IMPLANT
LOOP VESSEL MAXI BLUE (MISCELLANEOUS) IMPLANT
NEEDLE HYPO 22GX1.5 SAFETY (NEEDLE) ×4 IMPLANT
NS IRRIG 1000ML POUR BTL (IV SOLUTION) ×4 IMPLANT
PACK GENERAL/GYN (CUSTOM PROCEDURE TRAY) ×2 IMPLANT
RELOAD PROXIMATE 75MM BLUE (ENDOMECHANICALS) IMPLANT
RELOAD PROXIMATE TA60MM BLUE (ENDOMECHANICALS) IMPLANT
RETRACTOR WND ALEXIS 25 LRG (MISCELLANEOUS) IMPLANT
RTRCTR WOUND ALEXIS 18CM MED (MISCELLANEOUS) ×2
RTRCTR WOUND ALEXIS 25CM LRG (MISCELLANEOUS)
SPONGE LAP 18X18 RF (DISPOSABLE) IMPLANT
STAPLER GUN LINEAR PROX 60 (STAPLE) IMPLANT
STAPLER PROXIMATE 75MM BLUE (STAPLE) IMPLANT
STAPLER VISISTAT 35W (STAPLE) IMPLANT
STRIP CLOSURE SKIN 1/2X4 (GAUZE/BANDAGES/DRESSINGS) ×2 IMPLANT
SURGIFLO W/THROMBIN 8M KIT (HEMOSTASIS) IMPLANT
SUT MNCRL AB 4-0 PS2 18 (SUTURE) ×4 IMPLANT
SUT PDS AB 1 TP1 96 (SUTURE) ×4 IMPLANT
SUT PROLENE 1 CT 1 30 (SUTURE) ×24 IMPLANT
SUT SILK 3 0 SH CR/8 (SUTURE) IMPLANT
SUT VIC AB 0 CT1 36 (SUTURE) IMPLANT
SUT VIC AB 2-0 CT1 36 (SUTURE) IMPLANT
SUT VIC AB 2-0 CT2 27 (SUTURE) ×6 IMPLANT
SUT VIC AB 2-0 SH 27 (SUTURE)
SUT VIC AB 2-0 SH 27X BRD (SUTURE) IMPLANT
SUT VIC AB 3-0 CT1 27 (SUTURE)
SUT VIC AB 3-0 CT1 TAPERPNT 27 (SUTURE) IMPLANT
SUT VIC AB 3-0 CTX 36 (SUTURE) ×2 IMPLANT
SUT VIC AB 3-0 SH 18 (SUTURE) ×6 IMPLANT
SUT VIC AB 3-0 SH 27 (SUTURE) ×6
SUT VIC AB 3-0 SH 27X BRD (SUTURE) ×3 IMPLANT
SYR 30ML LL (SYRINGE) ×4 IMPLANT
TOWEL OR 17X26 10 PK STRL BLUE (TOWEL DISPOSABLE) ×2 IMPLANT
TOWEL OR NON WOVEN STRL DISP B (DISPOSABLE) ×2 IMPLANT
TRAY FOLEY MTR SLVR 16FR STAT (SET/KITS/TRAYS/PACK) ×2 IMPLANT
UNDERPAD 30X36 HEAVY ABSORB (UNDERPADS AND DIAPERS) ×2 IMPLANT

## 2020-03-04 NOTE — Consult Note (Signed)
Consult Note: Gyn-Onc  Consult was requested by Dr. Sabino Gasser for the evaluation of Connie Heath 69 y.o. female  CC:  Chief Complaint  Patient presents with  . Abdominal Pain  . Constipation    Assessment/Plan:  Connie Heath  is a 69 y.o.  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  New recurrence in pelvis diagnosed 9/21. Large bowel obstruction (sigmoid) from mass. Right ureteral obstruction.   I had a lengthy discussion with the patient regarding her underlying diagnosis of recurrent ovarian carcinosarcoma.  She understands that this is noncurable, however palliative chemotherapy might be an option for her.  She is DNR though is interested in palliative measures.  I explained that the 2 options for management of her large bowel obstruction would be placement of a rectal stent which may be complicated by migration or persistent compression and obstruction as the tumor grows.  The alternative option would be diverting colostomy which I favor.  Patient is in favor of proceeding with a diverting colostomy.  After she is healed from surgery she can be considered for palliative intent chemotherapy should she desire this.  We will have wound and ostomy nurses see her to mark her for ideal stomal placement.  She provided informed consent for me today.  Her husband was present for the discussion.  NPO Hold lovenox Hold PO meds IVF's to 150cc cephoxitin on call to OR.   She will be transferred to our service for postop care  HPI:  Connie Heath is a 69 year old P0 who was seen as a referral from the [facility] ED for ascites and a right pelvic mass with peritoneal nodularity.   Saw a gynecologist in [place] for dyspareunia in September, 2020. She reported having a normal examination.  In mid November, 2020 she began feeling unwell with "fevers" (none >100.3) and malaise and intractable cough. She  was tested for COVID on November 21st, 2020.  She took anti-tussives, augmentin and this improved her respiratory symptoms. However on 04/12/19 she began experiencing abdominal fullness and abdominal pains in the right lower quadrant.    Ca125 was drawn on April 17, 2019 and was elevated at 288.  She underwent paracentesis for 3 L of peritoneal fluid on April 18, 2019 which revealed scattered malignant cells with immunostains consistent for a gynecologic primary, positive for cytokeratin 7, PAX 8, and WT1.  She commenced chemotherapy on April 27, 2019 with carboplatin paclitaxel.  She received dosing every 21 days with day 1 of cycle 3 administered on June 11, 2019.  On day 1 of cycle 3 her CA-125 had decreased to 20.8.  Follow-up CT scan of the abdomen and pelvis performed on 31st 2021 revealed good partial response to neoadjuvant chemotherapy with a reduction in the size of a complex cystic right adnexal mass to 6.6 cm, omental nodularity had largely resolved.  The ascites had resolved and there was no adenopathy present.  There were no metastatic disease in the chest identified.  On 07/03/19 she underwent robotic assisted BSO omentectomy radical tumor debulking to R0.  Intraoperative findings were significant for an excellent response to neoadjuvant chemotherapy.  There was a six 8 cm right ovarian mass densely adherent to the pelvic sidewall.  Normal appendix.  The left tube and ovary were free of obvious masses.  There was omental nodularity throughout the infracolic omentum.  At the completion of the procedure there was no gross macroscopic  disease remaining.  Genetic testing was negative for BRCA mutations/HRD. She demonstrated a VUS for MRE11A.   Surgery was uncomplicated as was her postoperative recovery.  Final pathology confirmed a carcinosarcoma of the right tube and ovary with high grade serous metastasis to the left tube and ovary predominantly on the surface and the  omentum with the largest metastatic deposit measuring 4.3 cm.  She was confirmed a stage IIIc.  Postoperatively she completed 3 adjuvant cycles of carboplatin and paclitaxel. This was completed on 09/10/19 when CA 125 was normal at 9.5.   Post treatment imaging on 10/10/19 showed no recurrent/persistent disease (complete clinical response).  CA 125 was normal on 10/10/19 at 8.9.  Interval Hx:  She had symptoms consistent with recurrence with pelvic pressure and febrile-like symptoms in September 2021.  This prompted CT imaging of the abdomen and pelvis to be performed by Dr. Simeon Craft such on February 21, 2020.  This demonstrated a complex cystic mass versus, less likely, an abscess along the vaginal cuff and extending anterior to the rectum along the right side of the rectosigmoid junction.  It measured approximate 11 cm.  There was mild right hydronephrosis and hydroureter.  Ca1 25 at that time was normal at 10.9.  Her case was presented at multidisciplinary conference and the imaging was felt to be most most consistent with recurrence.  She had a planned treatment course of doxorubicin which was to start on 03/04/2020.  However prior to that she developed obstipation with inability to pass bowel movements and was presented to the emergency department on February 26, 2020.  She had some abdominal pain and pelvic pressure.  She denied nausea and emesis.  She had failed attempt at passage of bowel movements with multiple stool softeners and laxatives.  She did continue to pass a small amount of flatus.  Repeat CT scan on 03/02/2020 showed enlargement of the complex pelvic mass in the preceding 10 days, now measuring at least 11 cm x 11 cm x 9 cm.  It was causing sigmoid colon obstruction and right ureteral obstruction with right hydroureter nephrosis.  A ventral abdominal wall hernia was seen in the epigastrium.  This was not the source of obstruction.  The colon was full of stool.  The small bowel is not  dilated.  There was trace ascites.  She was admitted and placed on clear liquid diet with IV fluids.  Current Meds:  Current Facility-Administered Medications:  .  acetaminophen (TYLENOL) tablet 650 mg, 650 mg, Oral, Q6H PRN **OR** acetaminophen (TYLENOL) suppository 650 mg, 650 mg, Rectal, Q6H PRN, Harold Hedge, MD .  ALPRAZolam Duanne Moron) tablet 0.25 mg, 0.25 mg, Oral, Daily PRN, Harold Hedge, MD .  Chlorhexidine Gluconate Cloth 2 % PADS 6 each, 6 each, Topical, Daily, Dwyane Dee, MD, 6 each at 03/04/20 0825 .  diphenhydrAMINE-zinc acetate (BENADRYL) 2-0.1 % cream, , Topical, BID PRN, Neila Gear, NP, Given at 03/02/20 2042 .  enoxaparin (LOVENOX) injection 40 mg, 40 mg, Subcutaneous, Q24H, Harold Hedge, MD, 40 mg at 03/03/20 2208 .  FLUoxetine (PROZAC) capsule 20 mg, 20 mg, Oral, Daily, Harold Hedge, MD, 20 mg at 03/03/20 0913 .  HYDROcodone-acetaminophen (NORCO/VICODIN) 5-325 MG per tablet 1-2 tablet, 1-2 tablet, Oral, Q4H PRN, Harold Hedge, MD, 2 tablet at 03/02/20 2354 .  HYDROmorphone (DILAUDID) injection 1 mg, 1 mg, Intravenous, Q2H PRN, Curcio, Kristin R, NP, 1 mg at 03/04/20 0756 .  ondansetron (ZOFRAN) tablet 4 mg, 4 mg, Oral, Q6H  PRN **OR** ondansetron (ZOFRAN) injection 4 mg, 4 mg, Intravenous, Q6H PRN, Harold Hedge, MD, 4 mg at 03/04/20 0755 .  polyethylene glycol (MIRALAX / GLYCOLAX) packet 17 g, 17 g, Oral, BID, Harold Hedge, MD, 17 g at 03/03/20 2207 .  simvastatin (ZOCOR) tablet 40 mg, 40 mg, Oral, QHS, Harold Hedge, MD, 40 mg at 03/03/20 2209 .  sodium chloride flush (NS) 0.9 % injection 10-40 mL, 10-40 mL, Intracatheter, PRN, Dwyane Dee, MD .  triamcinolone cream (KENALOG) 0.1 %, , Topical, BID, Blount, Lolita Cram, NP, Given at 03/04/20 1014   Allergy:  Allergies  Allergen Reactions  . Bee Venom Anaphylaxis  . Amlodipine Swelling    also ineffective  . Demerol [Meperidine Hcl] Nausea And Vomiting       . Lisinopril Cough  . Losartan      Bladder pain  . Other Rash    Dermabond Surgical glue- rash, whelps    Social Hx:   Social History   Socioeconomic History  . Marital status: Married    Spouse name: Not on file  . Number of children: Not on file  . Years of education: Not on file  . Highest education level: Not on file  Occupational History  . 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 Smoking status: Never Smoker  . Smokeless tobacco: Never Used  Vaping Use  . Vaping Use: Never used  Substance and Sexual Activity  . Alcohol use: Yes    Comment: Occasional  . Drug use: No  . Sexual activity: Not Currently  Other Topics Concern  . Not on file  Social History Narrative   Married   Gets regular exercise   Social Determinants of Health   Financial Resource Strain:   . Difficulty of Paying Living Expenses: Not on file  Food Insecurity:   . Worried About Charity fundraiser in the Last Year: Not on file  . Ran Out of Food in the Last Year: Not on file  Transportation Needs:   . Lack of Transportation (Medical): Not on file  . Lack of Transportation (Non-Medical): Not on file  Physical Activity:   . Days of Exercise per Week: Not on file  . Minutes of Exercise per Session: Not on file  Stress:   . Feeling of Stress : Not on file  Social Connections:   . Frequency of Communication with Friends and Family: Not on file  . Frequency of Social Gatherings with Friends and Family: Not on file  . Attends Religious Services: Not on file  . Active Member of Clubs or Organizations: Not on file  . Attends Archivist Meetings: Not on file  . Marital Status: Not on file  Intimate Partner Violence:   . Fear of Current or Ex-Partner: Not on file  . Emotionally Abused: Not on file  . Physically Abused: Not on file  . Sexually Abused: Not on file    Past Surgical Hx:  Past Surgical History:  Procedure Laterality Date  . DIAGNOSTIC  LAPAROSCOPY    . IR IMAGING GUIDED PORT INSERTION  05/17/2019  . JOINT REPLACEMENT     right total hip   . LAPAROSCOPIC CHOLECYSTECTOMY    . PARACENTESIS    . PARATHYROIDECTOMY Right 2013  . PARTIAL HYSTERECTOMY    . TONSILLECTOMY AND ADENOIDECTOMY      Past Medical Hx:  Past Medical History:  Diagnosis Date  . #[id number] dx'd 03/2019   ovarian cancer   . Depression   . Dyspnea    with increased exertion   . Family history of lung cancer   . Family history of stomach cancer   . Fatigue   . GERD (gastroesophageal reflux disease)   . Hyperlipidemia    H/o myalgias with Vytorin but tolerating simvastatin  . Hypertension   . Myalgia    Hx of   . S/P abdominal paracentesis 04/25/2019   pulled off 2.5 liters  . S/P cholecystectomy   . S/P hysterectomy   . Shingles     Past Gynecological History:  61 No LMP recorded. Patient has had a hysterectomy.  Family Hx:  Family History  Problem Relation Age of Onset  . Lung cancer Mother   . Cancer Father 42       Stomach  . Heart failure Maternal Grandmother 80       CHF  . Heart failure Maternal Grandfather 50       Acute MI  . Cancer Paternal Aunt        either colon or liver    Review of Systems:  Constitutional  Feels well,    ENT Normal appearing ears and nares bilaterally Skin/Breast  No rash, sores, jaundice, itching, dryness Cardiovascular  No chest pain, shortness of breath, or edema  Pulmonary  No cough or wheeze.  Gastro Intestinal  + constipation Genito Urinary  No frequency, urgency, dysuria,  Musculo Skeletal  No myalgia, arthralgia, joint swelling or pain  Neurologic  + neuropathy Psychology  No depression, anxiety, insomnia.   Vitals:  Blood pressure (!) 148/78, pulse 76, temperature 98.1 F (36.7 C), temperature source Oral, resp. rate 16, height $RemoveBe'5\' 7"'[id number]$  (1.702 m), weight 187 lb 6.3 oz (85 kg), SpO2 98 %.  Physical Exam: WD in NAD Neck  Supple NROM, without any enlargements.  Lymph Node  Survey No cervical supraclavicular or inguinal adenopathy Cardiovascular  Well perfused peripheries Lungs  No increased WOB Skin  No rash/lesions/breakdown  Psychiatry  Alert and oriented to person, place, and time  Abdomen  Normoactive bowel sounds, abdomen soft, non-tender and obese without evidence of hernia. Soft, non tympanitic.  Back No CVA tenderness Genito Urinary  deferred Rectal  deferred Extremities  No bilateral cyanosis, clubbing or edema.  60 minutes of total time was spent for this patient encounter, including preparation, face-to-face counseling with the patient and coordination of care, review of imaging (results and images), communication with the referring provider and documentation of the encounter.   Thereasa Solo, MD  03/04/2020, 10:20 AM

## 2020-03-04 NOTE — Progress Notes (Signed)
Pt. Came back in the unit status post exploratory laparotomy with transverse colostomy and hernia repair. Pt. Is alert and oriented, Vital sign taken and recorded. Pt. Complained of pain on the abdomen, pain score is 9/10. Colostomy stoma looks pink, no bleeding noted, colostomy bag intact, honeycomb dressing dry and intact. Pt. Refused to remove foley catheter, will reassess and reinforce order.

## 2020-03-04 NOTE — Consult Note (Signed)
[place] Nurse requested for preoperative stoma site marking  Discussed surgical procedure and stoma creation with patient and family. Spouse at bedside.  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  She is thankful for the preoperative marking and prefers stoma to be higher, better in her field of vision. She anticipates her husband will be helping her with care and he will learn pouch changes as well.  Explained role of the [place] nurse team.  Provided the patient with educational booklet and provided samples of pouching options.  Answered patient and family questions.   Examined patient lying, sitting, in order to place the marking in the patient's visual field, away from any creases or abdominal contour issues and within the rectus muscle. Patient preferred marking above umbilicus so she could see the stoma better.  Noted laparoscopy  scarring in this area as well.   Marked for colostomy in the LLQ  4 cm to the left of the umbilicus and 3 cm above  the umbilicus.  Marked for ileostomy in the RLQ  4 cm to the right of the umbilicus and  4 cm above  the umbilicus.  Patient's abdomen cleansed with CHG wipes at site markings, allowed to air dry prior to marking.Covered mark with thin film transparent dressing to preserve mark until date of surgery.   [facility] Nurse team will follow up with patient after surgery for continue ostomy care and teaching.   Domenic Moras MSN, RN, FNP-BC CWON Wound, Ostomy, Continence Nurse Pager [phone]

## 2020-03-04 NOTE — Progress Notes (Addendum)
Patient off unit for scheduled diversion colostomy. Pt is alert and oriented, not on any distress. Pain meds given prior to OR. CHG bath done, PCR swab taken by Terri RN. Pre op checklist done.

## 2020-03-04 NOTE — Op Note (Signed)
OPERATIVE NOTE  Preoperative Diagnosis: recurrent carcinosarcoma of the ovary with large obstructing pelvic mass, large bowel obstruction, ventral incisional hernia (non-incarcerated, non-strangulated)  Postoperative Diagnosis:same    Procedure(s) Performed: Exploratory laparotomy with diverting loop transverse colostomy, ventral hernia repair (primary) .  Surgeon: Thereasa Solo, MD.  Assistant Surgeon: Lahoma Crocker, M.D. Assistant: (an MD assistant was necessary for tissue manipulation, retraction and positioning due to the complexity of the case and hospital policies).   Specimens: pelvic tumor, hernia sac   Estimated Blood Loss: 100 mL.    Urine [id number]:993 cc  Complications: None.   Operative Findings: a  15cm fixed pelvic mass filling the pelvis and obstructing the sigmoid colon. Moderately distended transverse colon. Normal caliber small intestine. Miliary studding of diaphragm with tumor. No other significant intraperitoneal disease. Ventral incisional hernia in the site of the prior midline vertical epigastric incision.  Procedure:   The patient was seen in the Holding Room. The risks, benefits, complications, treatment options, and expected outcomes were discussed with the patient.  The patient concurred with the proposed plan, giving informed consent.   The patient was  identified as Connie Heath  and the procedure verified as ex lap with diverting loop colostomy. A Time Out was held and the above information confirmed upon entry to the operating room.  After induction of anesthesia, the patient was draped and prepped in the usual sterile manner.  She was prepped and draped in the normal sterile fashion in the supine position. A Foley catheter was placed to gravity.   A midline vertical incision was made and carried through the subcutaneous tissue .  A scalpel was used to carefully incise the subcutaneous tissue knowing it was an underlying fascial defect with the incisional  hernia.  The hernia sac was carefully entered sharply avoiding injury to the underlying transverse colon that was within the hernia sac.  The hernia sac was entered which facilitated peritoneal entry.  The peritoneal incision was extended superiorly and inferiorly.  The bowel was run and inspected for site of obstruction.  It downstream obstruction at the level of the sigmoid colon at the pelvic brim was appreciated by a large obstructing pelvic mass.  The mass was biopsied and sent for permanent histology.  The site of the colostomy had been marked out by the ostomy nurses earlier in the day.  A site on the left upper abdomen was chosen.  The sigmoid colon and descending colon were not long enough to reach this site in a tension-free manner, therefore transverse loop colostomy was chosen for optimal placement and tension-free delivery.  The colostomy incision was made with the Bovie.  The subcutaneous fat was carefully dissected and the fascia was opened in a cruciate fashion.  The rectus muscles were divided vertically and bluntly and the.  Posterior sheet was opened vertically.  It was stretched to accommodate 2 fingers breaths easily.  The site of the transverse loop colostomy was chosen.  A window in the mesentery was made with the Bovie and a red rubber catheter was passed through this to aid in delivery of the loop through the colostomy fascial defect.  The colostomy easily delivered through to the skin in a tension-free manner.  At this point the hernia repair was performed.  First the hernia sac was resected from the edges of the fascial defect.  It was sent for pathology.  The fascia was cleaned circumferentially to ensure that there was adequate fascia for closure primarily.  On the left  this resulted in a relatively thin bridge of fascia between the incision and the colostomy which was unavoidable due to the chosen location of the colostomy by previous marking.  Once the edges had been cleaned  the fascial incision was closed with interrupted #1 Prolene sutures using mass closure technique.  The subcutaneous fat was closed with 3-0 Vicryl.  The skin was closed with subcuticular suture.  Exparel mixed with quarter percent plain Marcaine was infiltrated into the incision and around the colostomy site.  After the skin incision had been dressed with Steri-Strips and an impervious dressing the colostomy was matured.  The loop of transverse colon was opened to reveal the double barrel loop.  It had been anchored to the posterior sheath internally prior to closure of the vertical midline incision.  It was anchored at the level of the dermis to the skin circumferentially with 4 stay sutures.  The bowel edges were then everted and the stoma was matured circumferentially with 3-0 Vicryl interrupted sutures ensuring eversion and rose budding of the stoma.  After completion of the maturation of the stoma the integrity and patency of both loops was confirmed with a digital exam.  No twisting was noted.  The colostomy appeared viable and pink.  The stoma appliance was fixed to the patient.    Sponge, lap and needle counts were correct x 2.

## 2020-03-04 NOTE — Progress Notes (Signed)
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 Alvy Bimler, most recent appointment on 10/12), cancer related pain, anemia of chronic disease, right-sided hydronephrosis, depression, hyperlipidemia, hypertension who presented to the ED with worsening RLQ abdominal pain, rectal pain and constipation.   Per Dr. Calton Dach note on 10/5, the patient had new onset abdominal pain and flank pain and was found to have a UTI and started on Omnicef, given a prescription of oxycodone as needed and a CT scan was ordered.  CT scan at that time was concerning for either cancer recurrence or pelvic abscess though an abscess was less likely.  Plan was to start on palliative chemotherapy with doxorubicin and a baseline echo was ordered (plan to start chemo on Tuesday).    She now presented with having right lower quadrant pain which is different than her normal pain that is severe and does not radiate anywhere and is constant.  Constipation has not improved with senna and MiraLAX.  Pain started 3-5 days PTA and she has not had a BM in the past 1-2 days.  Has decreased appetite.  Is passing a small amount of flatus.  Also with rectal pain which is her more significant symptom and has had nausea without vomiting.  Denies fever, chills, dysuria, hematuria, vaginal pain, vaginal discharge or bleeding, chest pain.  Patient has had a hysterectomy and oophorectomy and cholecystectomy.   ED Course: Afebrile, tachycardic and hypertensive and tolerating room air.  Labs notable for sodium 131, chloride 93, Hb 10.5 (at baseline), UA negative.  CT abdomen pelvis most notable for rapid enlargement  of complex pelvic mass since most recent CT scan with increased obstruction of the adherent sigmoid colon and increased obstruction of the right ureter, trace hepatic ascites.  She was given Dilaudid, Zofran and 1 L NS bolus.  Oncology was consulted on admission.  Her case was reviewed as well as CT findings.  Given obstruction of the sigmoid colon due to her mass, it was recommended for diversion colostomy prior to initiation of chemotherapy for further tumor shrinking. This is tentatively planned for 10/19.     Interval History:  No events overnight. Still comfortable in bed; husband bedside.  Denies much pain, having minimal flatus, no bowel movement. Tentative plan is for surgery today.  Old records reviewed in assessment of this patient  ROS: Constitutional: negative for chills and fevers, Respiratory: negative for cough, Cardiovascular: negative for chest pain and Gastrointestinal: positive for abdominal pain  Assessment & Plan: * Bowel obstruction (HCC) -CT showing progressive enlargement of pelvic mass from prior imaging, now with obstruction on adherent sigmoid colon -Patient has been evaluated by oncology with recommendations for diverting colostomy to relieve obstruction prior to initiation of chemotherapy for further tumor shrinking -Continue pain and nausea control -Continue holding off on antibiotics at this time -Tentative plan is for diverting colostomy on 03/04/2020.  After surgery patient will remain on GYN oncology service as primary service  Ovarian cancer on right [facility]) - appreciate oncology assistance - patient has elected for DNR; prognosis is poor and treatment is palliative   Hydronephrosis of right kidney - due to  mass compression  - on admission case discussed with urology; oncology has also discussed -Renal function remains stable with no signs of obstruction -Trend BMP  Cancer related pain - pain regimen in place; will further adjust as needed -  continue dilaudid and norco PRN  Anemia, chronic disease - per oncology, transfuse for Hgb<8 g/dL - has had recent outpatient workup with oncology; mostly IDA, low/normal B12 as well  Constipation - now plan is for diverting colostomy  Depression -Continue Prozac and Xanax   Antimicrobials: none  DVT prophylaxis: Lovenox Code Status: DNR Family Communication: husband bedside Disposition Plan: Status is: Inpatient  Remains inpatient appropriate because:Ongoing diagnostic testing needed not appropriate for outpatient work up, Unsafe d/c plan, IV treatments appropriate due to intensity of illness or inability to take PO and Inpatient level of care appropriate due to severity of illness   Dispo: The patient is from: Home              Anticipated d/c is to: Home              Anticipated d/c date is: 3 days              Patient currently is not medically stable to d/c.  Objective: Blood pressure (!) 148/78, pulse 76, temperature 98.1 F (36.7 C), temperature source Oral, resp. rate 16, height 5\' 7"  (1.702 m), weight 85 kg, SpO2 98 %.  Examination: General appearance: alert, cooperative and no distress Head: Normocephalic, without obvious abnormality, atraumatic Eyes: EOMI Lungs: clear to auscultation bilaterally Heart: regular rate and rhythm and S1, S2 normal Abdomen: soft, TTP in lower abdomen, no R/G, BS minimal (heard better in upper quadrants) Extremities: trace LE edema Skin: mobility and turgor normal Neurologic: Grossly normal  Consultants:   Oncology  Surgery  Procedures:   n/a  Data Reviewed: I have personally reviewed following labs and imaging studies Results for orders placed or performed during the hospital encounter of 03/02/20 (from the past 24 hour(s))  Basic metabolic panel     Status: Abnormal   Collection Time: 03/04/20  5:38 AM  Result Value Ref Range   Sodium 135 135 - 145 mmol/L   Potassium 4.3 3.5 - 5.1 mmol/L   Chloride 97 (L) 98 - 111  mmol/L   CO2 26 22 - 32 mmol/L   Glucose, Bld 91 70 - 99 mg/dL   BUN 6 (L) 8 - 23 mg/dL   Creatinine, Ser 0.94 0.44 - 1.00 mg/dL   Calcium 9.5 8.9 - 10.3 mg/dL   GFR, Estimated >60 >60 mL/min   Anion gap 12 5 - 15  CBC with Differential/Platelet     Status: Abnormal   Collection Time: 03/04/20  5:38 AM  Result Value Ref Range   WBC 8.2 4.0 - 10.5 K/uL   RBC 3.13 (L) 3.87 - 5.11 MIL/uL   Hemoglobin 9.6 (L) 12.0 - 15.0 g/dL   HCT 28.5 (L) 36 - 46 %   MCV 91.1 80.0 - 100.0 fL   MCH 30.7 26.0 - 34.0 pg   MCHC 33.7 30.0 - 36.0 g/dL   RDW 13.6 11.5 - 15.5 %   Platelets 471 (H) 150 - 400 K/uL   nRBC 0.0 0.0 - 0.2 %   Neutrophils Relative % 58 %   Neutro Abs 4.8 1.7 - 7.7 K/uL   Lymphocytes Relative 26 %   Lymphs Abs 2.1 0.7 - 4.0 K/uL   Monocytes Relative 11 %   Monocytes Absolute 0.9 0.1 -  1.0 K/uL   Eosinophils Relative 3 %   Eosinophils Absolute 0.2 0.0 - 0.5 K/uL   Basophils Relative 1 %   Basophils Absolute 0.1 0.0 - 0.1 K/uL   Immature Granulocytes 1 %   Abs Immature Granulocytes 0.05 0.00 - 0.07 K/uL  Magnesium     Status: None   Collection Time: 03/04/20  5:38 AM  Result Value Ref Range   Magnesium 2.2 1.7 - 2.4 mg/dL    Recent Results (from the past 240 hour(s))  Respiratory Panel by RT PCR (Flu A&B, Covid) - Nasopharyngeal Swab     Status: None   Collection Time: 03/02/20  3:04 PM   Specimen: Nasopharyngeal Swab  Result Value Ref Range Status   SARS Coronavirus 2 by RT PCR NEGATIVE NEGATIVE Final    Comment: (NOTE) SARS-CoV-2 target nucleic acids are NOT DETECTED.  The SARS-CoV-2 RNA is generally detectable in upper respiratoy specimens during the acute phase of infection. The lowest concentration of SARS-CoV-2 viral copies this assay can detect is 131 copies/mL. A negative result does not preclude SARS-Cov-2 infection and should not be used as the sole basis for treatment or other patient management decisions. A negative result may occur with  improper  specimen collection/handling, submission of specimen other than nasopharyngeal swab, presence of viral mutation(s) within the areas targeted by this assay, and inadequate number of viral copies (<131 copies/mL). A negative result must be combined with clinical observations, patient history, and epidemiological information. The expected result is Negative.  Fact Sheet for Patients:  PinkCheek.be  Fact Sheet for Healthcare Providers:  GravelBags.it  This test is no t yet approved or cleared by the [place] FDA and  has been authorized for detection and/or diagnosis of SARS-CoV-2 by FDA under an Emergency Use Authorization (EUA). This EUA will remain  in effect (meaning this test can be used) for the duration of the COVID-19 declaration under Section 564(b)(1) of the Act, 21 U.S.C. section 360bbb-3(b)(1), unless the authorization is terminated or revoked sooner.     Influenza A by PCR NEGATIVE NEGATIVE Final   Influenza B by PCR NEGATIVE NEGATIVE Final    Comment: (NOTE) The Xpert Xpress SARS-CoV-2/FLU/RSV assay is intended as an aid in  the diagnosis of influenza from Nasopharyngeal swab specimens and  should not be used as a sole basis for treatment. Nasal washings and  aspirates are unacceptable for Xpert Xpress SARS-CoV-2/FLU/RSV  testing.  Fact Sheet for Patients: PinkCheek.be  Fact Sheet for Healthcare Providers: GravelBags.it  This test is not yet approved or cleared by the [place] FDA and  has been authorized for detection and/or diagnosis of SARS-CoV-2 by  FDA under an Emergency Use Authorization (EUA). This EUA will remain  in effect (meaning this test can be used) for the duration of the  Covid-19 declaration under Section 564(b)(1) of the Act, 21  U.S.C. section 360bbb-3(b)(1), unless the authorization is  terminated or revoked. Performed at  [facility], [place] [address] [place], [place] [address]      Radiology Studies: CT Abdomen Pelvis W Contrast  Result Date: 03/02/2020 CLINICAL DATA:  Right lower quadrant abdominal pain and sensation of pelvic pressure. History of ovarian cancer. EXAM: CT ABDOMEN AND PELVIS WITH CONTRAST TECHNIQUE: Multidetector CT imaging of the abdomen and pelvis was performed using the standard protocol following bolus administration of intravenous contrast. CONTRAST:  170mL OMNIPAQUE IOHEXOL 300 MG/ML  SOLN COMPARISON:  02/21/2020 FINDINGS: Lower chest: 4 mm calcified granuloma in the right lower  lobe on image 2 of series 4. 3 mm pleural-based nodule along the right hemidiaphragm on image 7 of series 4 is stable. Other calcified granulomas are present at the right lung base. Mild linear scarring peripherally at the left lung base is unchanged. Left lower lobe granulomas are also present. Small type 1 hiatal hernia. 0.7 cm lymph node in the right pericardial space on image 11 of series 2, previously 0.5 cm. Hepatobiliary: Trace perihepatic ascites is new.  Cholecystectomy. Pancreas: Unremarkable Spleen: Old granulomatous disease. Adrenals/Urinary Tract: Both adrenal glands appear normal. Worsened right hydronephrosis and hydroureter extending down to a region of extrinsic compression on the ureter just past the iliac vessel cross over. Invasion of the ureter by the pelvic mass is not excluded. The degree of hydronephrosis is now moderate to prominent, likely with some minimal delay in excretion contrast medium. Consider urology consultation for potential stenting or percutaneous nephrostomy. The left kidney appears normal. The urinary bladder is effaced by the pelvic mass. Stomach/Bowel: Small type 1 hiatal hernia. There is prominence of stool throughout the colon extending down to the sigmoid colon. The sigmoid colon itself is effaced by the large pelvic mass and flattened by the mass, and difficult  to separate from the margins of the mass. Obstruction or invasion of the sigmoid colon by the mass is a distinct possibility. Vascular/Lymphatic: Aortoiliac atherosclerotic vascular disease. Portacaval node 1.2 cm in short axis on image 22 of series 2, previously 0.9 cm. Stable 0.7 cm left common iliac node on image 48 of series 2. Reproductive: Large multilobular pelvic mass or abscess is identified. The posterior portion measures 11.3 by 7.2 by 8.1 cm (volume = 350 cm^3) today and previously measured 9.4 by 6.0 by 7.0 cm (volume = 210 cm^3) ten days ago. The more anterior portion measures about 9.0 by 5.0 by 11.1 cm (volume = 260 cm^3) today and previously measured about 5.9 by 3.3 by 6.8 cm (volume = 69 cm^3) ten days ago. These collections appear continuous and sits just above the vaginal cuff. Other: As noted above, there is trace perihepatic ascites, increased from prior. Stable mild presacral edema. Musculoskeletal: Stable rectus diastasis with a small amount of the transverse colon bulging into the diastatic region. Stable lumbar spondylosis and degenerative disc disease. Right hip prosthesis noted. IMPRESSION: 1. Rapid enlargement of complex pelvic mass lesion over the last 10 days, causing increased obstruction of the adherent sigmoid colon and increased obstruction of the right ureter. Possibilities include rapidly growing tumor or abscess. It would be somewhat unusual for abscess to cause this degree of ureteral and bowel obstruction. 2. Trace perihepatic ascites, increased from prior. 3. Other imaging findings of potential clinical significance: Small type 1 hiatal hernia. Stable rectus diastasis with a small amount of the transverse colon bulging into the diastatic region. Stable lumbar spondylosis and degenerative disc disease. 4. Aortic atherosclerosis. Aortic Atherosclerosis (ICD10-I70.0). Electronically Signed   By: Van Clines M.D.   On: 03/02/2020 12:55   CT Abdomen Pelvis W Contrast    Final Result      Scheduled Meds: . Chlorhexidine Gluconate Cloth  6 each Topical Daily  . FLUoxetine  20 mg Oral Daily  . polyethylene glycol  17 g Oral BID  . triamcinolone cream   Topical BID   PRN Meds: acetaminophen **OR** acetaminophen, ALPRAZolam, diphenhydrAMINE-zinc acetate, HYDROcodone-acetaminophen, HYDROmorphone (DILAUDID) injection, ondansetron **OR** ondansetron (ZOFRAN) IV, sodium chloride flush Continuous Infusions: . cefOXitin    . dextrose 5 % and 0.45 % NaCl with  KCl 20 mEq/L        LOS: 2 days  Time spent: Greater than 50% of the 35 minute visit was spent in counseling/coordination of care for the patient as laid out in the A&P.   Dwyane Dee, MD [facility] 03/04/2020, 11:35 AM

## 2020-03-04 NOTE — Anesthesia Preprocedure Evaluation (Addendum)
Anesthesia Evaluation  Patient identified by MRN, date of birth, ID band Patient awake    Reviewed: Allergy & Precautions, H&P , NPO status , Patient's Chart, lab work & pertinent test results  Airway Mallampati: II  TM Distance: >3 FB Neck ROM: Full    Dental  (+) Dental Advisory Given, Teeth Intact   Pulmonary shortness of breath, asthma ,    Pulmonary exam normal breath sounds clear to auscultation       Cardiovascular hypertension, Pt. on medications Normal cardiovascular exam Rhythm:Regular Rate:Normal     Neuro/Psych PSYCHIATRIC DISORDERS Depression  Neuromuscular disease    GI/Hepatic GERD  ,  Endo/Other    Renal/GU Renal disease     Musculoskeletal  (+) Fibromyalgia -  Abdominal   Peds  Hematology  (+) Blood dyscrasia, anemia ,   Anesthesia Other Findings   Reproductive/Obstetrics Ovarian CA                           Anesthesia Physical  Anesthesia Plan  ASA: III and emergent  Anesthesia Plan: General   Post-op Pain Management:    Induction: Intravenous, Rapid sequence and Cricoid pressure planned  PONV Risk Score and Plan: 4 or greater and Ondansetron, Dexamethasone, Midazolam and Treatment may vary due to age or medical condition  Airway Management Planned: Oral ETT  Additional Equipment:   Intra-op Plan:   Post-operative Plan: Extubation in OR  Informed Consent: I have reviewed the patients History and Physical, chart, labs and discussed the procedure including the risks, benefits and alternatives for the proposed anesthesia with the patient or authorized representative who has indicated his/her understanding and acceptance.   Patient has DNR.  Suspend DNR.   Dental advisory given  Plan Discussed with: CRNA  Anesthesia Plan Comments: (Discussed DNR in depth with patient. She agrees to suspend DNR for procedure, but does not want long term, heroic, life  prolonging procedures to continue. She expressed understanding that some resuscitation is necessary to differentiate a cause for cardiac arrest or other reasons for resuscitation. )       Anesthesia Quick Evaluation

## 2020-03-04 NOTE — Anesthesia Procedure Notes (Addendum)
Procedure Name: Intubation Date/Time: 03/04/2020 1:44 PM Performed by: Raenette Rover, CRNA Pre-anesthesia Checklist: Patient identified, Emergency Drugs available, Suction available and Patient being monitored Patient Re-evaluated:Patient Re-evaluated prior to induction Oxygen Delivery Method: Circle system utilized Preoxygenation: Pre-oxygenation with 100% oxygen Induction Type: IV induction, Rapid sequence and Cricoid Pressure applied Laryngoscope Size: Mac and 3 Grade View: Grade I Tube type: Oral Tube size: 7.0 mm Number of attempts: 1 Airway Equipment and Method: Stylet Placement Confirmation: ETT inserted through vocal cords under direct vision,  positive ETCO2 and breath sounds checked- equal and bilateral Secured at: 22 cm Tube secured with: Tape Dental Injury: Teeth and Oropharynx as per pre-operative assessment

## 2020-03-04 NOTE — Anesthesia Postprocedure Evaluation (Signed)
Anesthesia Post Note  Patient: Connie Heath  Procedure(s) Performed: EXPLORATORY LAPAROTOMY WITH TRANSVERSE LOOPCOLOSTOMY (N/A ) HERNIA REPAIR VENTRAL ADULT (N/A )     Patient location during evaluation: PACU Anesthesia Type: General Level of consciousness: awake and alert Pain management: pain level controlled Vital Signs Assessment: post-procedure vital signs reviewed and stable Respiratory status: spontaneous breathing, nonlabored ventilation and respiratory function stable Cardiovascular status: blood pressure returned to baseline and stable Postop Assessment: no apparent nausea or vomiting Anesthetic complications: no   No complications documented.  Last Vitals:  Vitals:   03/04/20 1715 03/04/20 1800  BP: (!) 144/77 (!) 155/85  Pulse: 84 96  Resp: 15 16  Temp:    SpO2: 100% 97%    Last Pain:  Vitals:   03/04/20 1715  TempSrc:   PainSc: [place]

## 2020-03-04 NOTE — Transfer of Care (Signed)
Immediate Anesthesia Transfer of Care Note  Patient: Connie Heath  Procedure(s) Performed: EXPLORATORY LAPAROTOMY WITH TRANSVERSE LOOPCOLOSTOMY (N/A ) HERNIA REPAIR VENTRAL ADULT (N/A )  Patient Location: PACU  Anesthesia Type:General  Level of Consciousness: awake, alert , oriented and patient cooperative  Airway & Oxygen Therapy: Patient Spontanous Breathing and Patient connected to face mask oxygen  Post-op Assessment: Report given to RN and Post -op Vital signs reviewed and stable  Post vital signs: Reviewed and stable  Last Vitals:  Vitals Value Taken Time  BP 169/85 03/04/20 1622  Temp    Pulse 97 03/04/20 1625  Resp 16 03/04/20 1625  SpO2 100 % 03/04/20 1625  Vitals shown include unvalidated device data.  Last Pain:  Vitals:   03/04/20 1247  TempSrc: Oral  PainSc:       Patients Stated Pain Goal: 1 (71/58/06 [id number])  Complications: No complications documented.

## 2020-03-04 NOTE — Progress Notes (Addendum)
[place] PROGRESS NOTE  Patient Care Team: Michael Boston, MD as PCP - General (Internal Medicine) Awanda Mink Craige Cotta, RN as Oncology Nurse Navigator (Oncology)  I have seen the patient, and agree with the documentation as follows ASSESSMENT & PLAN:  Ovarian cancer on right [facility]) We have extensive discussion about her case recently at the GYN oncology tumor board recently CT of the abdomen pelvis performed on 10/7 has previously been reviewed showing disease progression Repeat CT of the abdomen pelvis performed in the emergency room on 10/17 shows rapid enlargement of her pelvic mass causing obstruction of the sigmoid colon and worsening obstruction of the right ureter  She is symptomatic Unfortunately, even with inpatient chemotherapy, I do not expect her tumor to shrink rapidly I have reviewed her case with Dr. Denman George She is scheduled for diversion colostomy today  Bowel obstruction secondary to pelvic mass Pain controlled with IV Dilaudid She is not currently having any nausea or vomiting continues to pass flatus I recommend diversion colostomy before we proceed with chemotherapy to alleviate bowel obstruction, hopefully next week   Cancer associated pain Pelvic pain controlled at this time Continue Dilaudid 1 mg every 2 hours as needed for pain  She also has hydrocodone available to her   Anemia, chronic disease She has multifactorial anemia, likely anemia chronic disease Vitamin B12 level, ferritin, iron studies were checked in our office on 10/12-vitamin B12 and ferritin are adequate, but she has a low iron and percent saturation She would be at risk of worsening anemia while on treatment Transfuse for hemoglobin less than 8   Hydronephrosis of right kidney She was evaluated by urologist recently I have discussed this case with him and reviewed his documentation Given her preserved renal function, she is recommended for close observation for now I agreed not to  proceed with stent placement We will monitor her renal function carefully   Goals of care, counseling/discussion We have extensive discussions about goals of care She is aware that with recurrent disease, her cancer is considered noncurative I explained to the patient why surgery is not indicated We discussed the palliative nature of chemotherapy and she will likely need long-term treatment even if she achieved complete response to therapy She has advanced directives at home She does not want to be resuscitated -DNR order in place  Discharge planning The patient will need to have well-controlled abdominal pain and to tolerate her diet before discharge can be considered Will likely be here at least until the end of the week I will follow her while she is hospitalized I will tentatively schedule chemotherapy in the outpatient clinic next week  All questions were answered. The patient knows to call the clinic with any problems, questions or concerns.   [name], NP 03/04/2020 10:19 AM [name], MD  INTERVAL HISTORY: The patient continues to have abdominal pain which is controlled with IV Dilaudid She states pain medication lasts about 4 to 5 h She denies nausea and vomiting this morning Tolerating clear liquids Continues to pass flatus and clear mucus from her rectum  SUMMARY OF ONCOLOGIC HISTORY: Oncology History Overview Note  Neg genetics MSI stable   Ovarian cancer on right ([place])  01/16/2019 Initial Diagnosis   She had vague symptom of dyspareunia. Presented to ER for evaluation in November for abdominal pain and vague symptom of fullness and imaging showed abnormalities   04/16/2019 Imaging   1. Nonvisualization of the appendix. However, there is a large volume of  ascites within the abdomen and pelvis which appears partially loculated. In the acute setting findings may be the sequelae of peritonitis. However, there are multiple partially calcified peritoneal nodules  throughout the omentum which are concerning for peritoneal carcinomatosis. Noncalcified nodule in the left lower quadrant peritoneal reflection is noted. Further investigation with diagnostic paracentesis is advised. 2. Ill-defined low-density mass within the right iliac fossa is suspected and may represent an ovarian neoplasm. 3. Aortic atherosclerosis. 4. Small bilateral pleural effusions. 5. Hiatal hernia. 6. Prior granulomatous disease.   Aortic Atherosclerosis (ICD10-I70.0).   04/17/2019 Tumor Marker   Patient's tumor was tested for the following markers: CA-125 Results of the tumor marker test revealed 288.   04/18/2019 Procedure   Successful ultrasound-guided diagnostic and therapeutic paracentesis yielding 3.2 liters of peritoneal fluid.   04/18/2019 Pathology Results   FINAL MICROSCOPIC DIAGNOSIS:  - Malignant cells present  - See comment   SPECIMEN ADEQUACY:  Satisfactory for evaluation   DIAGNOSTIC COMMENTS:  The cell block has scattered malignant cells.  These atypical cells are positive for cytokeraitn 7, MOC-31, Pax8, and WT-1 but negative for TTF-1 and cytokeratin 20.  Overall, the features are consistent with a primary gynecologic neoplasm   04/20/2019 Cancer Staging   Staging form: Ovary, Fallopian Tube, and Primary Peritoneal Carcinoma, AJCC 8th Edition - Clinical stage from 04/20/2019: FIGO Stage IIIC (cT3c, cN0, cM0) - Signed by [name], MD on 04/20/2019   04/25/2019 Procedure   Successful ultrasound-guided therapeutic paracentesis yielding 2.5 liters of peritoneal fluid.     04/27/2019 - 09/10/2019 Chemotherapy   The patient had carboplatin and taxol x 3 cycles for neoadjuvant chemotherapy treatment, followed by interval debulking surgery and 3 more cycles of chemotherapy     05/17/2019 Procedure   Placement of a subcutaneous port device. Catheter tip at the SVC and right atrium junction.   06/11/2019 Tumor Marker   Patient's tumor was tested for the  following markers: CA-125 Results of the tumor marker test revealed 20.8   06/18/2019 Imaging   CT chest, abdomen and pelvis 1. Positive response to therapy. Complex 6.6 cm cystic right adnexal mass is decreased in size. Omental nodularity has largely resolved. Ascites has resolved. No adenopathy. 2. No evidence of metastatic disease in the chest. 3. Chronic findings include: Aortic Atherosclerosis (ICD10-I70.0). Small hiatal hernia. Mild sigmoid diverticulosis.   07/03/2019 Surgery   Preoperative Diagnosis: ovarian cancer stage IIIC     Procedure(s) Performed: Robotic-assisted laparoscopic bilateral salpingo-oophorectomy, omentectomy, radical tumor debulking with mini-laparotomy. Optimal cytoreduction with no gross residual disease remaining.    Surgeon: Everitt Amber, M.D. Specimens: Bilateral ovaries, fallopian tubes, omentum    Indication for Procedure:  Patient has a history of stage IIIC ovarian cancer, s/p 3 cycles of neoadjuvant chemotherapy with good partial clinical response. Right ovarian mass on imaging and omental caking.   Operative Findings: 6-8cm right ovarian mass densely adherent to pelvic sidewall with filmy adhesions to colonic epiploica but not bowel wall. Normal appendix. Slightly enlarged left tube and ovary but without discrete mass. Omental nodularity throughout infracolic omentum. Tiny miliary <59m nodules on left diaphragm.    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 Other Tissue/ Organ Involvement: Left ovary and omentum Largest Extrapelvic  Peritoneal Focus: 4.3 cm  tumor deposit in the omentum Peritoneal/Ascitic Fluid: Positive for carcinoma ([id number]) Treatment Effect: Moderate response identified (CRS 2) Regional Lymph Nodes: No lymph nodes submitted or found Pathologic Stage Classification (pTNM, AJCC 8th Edition): pT3c, pNx Representative Tumor Block: B10 Comment(s): Only the serous component shows metastasis to the omentum and spread to left ovary and right fallopian tube   07/27/2019 Tumor Marker   Patient's tumor was tested for the following markers: CA-125 Results of the tumor marker test revealed 13.7   08/14/2019 Genetic Testing   Negative germline + somatic testing. No pathogenic variants identified on the [employer]. VUS in MRE11A identified in the germline.  The report date is 08/14/2019.   The CancerNext gene panel offered by [employer] includes sequencing and rearrangement analysis for the following 36 genes: APC*, ATM*, AXIN2, BARD1, BMPR1A, BRCA1*, BRCA2*, BRIP1*, CDH1*, CDK4, CDKN2A, CHEK2*, DICER1, MLH1*, MSH2*, MSH3, MSH6*, MUTYH*, NBN, NF1*, NTHL1, PALB2*, PMS2*, PTEN*, RAD51C*, RAD51D*, RECQL, SMAD4, SMARCA4, STK11 and TP53* (sequencing and deletion/duplication); HOXB13, POLD1 and POLE (sequencing only); EPCAM and GREM1 (deletion/duplication only).   Somatic genes analyzed through TumorNext-[facility]: ATM, BARD1, BRCA1, BRCA2, BRIP1, CHEK2, MRE11A, NBN, PALB2, RAD51C, RAD51D.     09/10/2019 Tumor Marker   Patient's tumor was tested for the following markers: CA-125 Results of the tumor marker test revealed 9.5   10/10/2019 Imaging   1. No findings suspicious for recurrent metastatic disease in the abdomen or pelvis. No adenopathy. Minimal smooth peritoneal thickening in the paracolic gutters and deep pelvis, nonspecific, with no discrete peritoneal implants. No ascites. Continued CT surveillance warranted. 2. Chronic findings include: Aortic Atherosclerosis (ICD10-I70.0). Small hiatal hernia. Minimal  sigmoid diverticulosis.     10/10/2019 Tumor Marker   Patient's tumor was tested for the following markers: CA-125 Results of the tumor marker test revealed 8.9   02/18/2020 Tumor Marker   Patient's tumor was tested for the following markers: CA-125. Results of the tumor marker test revealed 10.9   02/18/2020 Imaging   No acute abnormality noted.     02/21/2020 Imaging   1. Complex cystic mass versus less likely complex abscess along the vaginal cuff and extending anterior to the rectum and along the right lateral side of the rectosigmoid junction, with components of the process extending up towards the adnexa, right greater than left. This measures up to 10.9 cm in diameter. If this represents an abscess I would expect the patient have striking symptoms (fever, pain, etc.). Given the patient's history, the appearance tends to favor recurrent ovarian malignancy over abscess. 2. Mild right hydronephrosis and hydroureter extending down to the level of the recurrent pelvic mass, indicating some low-grade obstruction. 3. There is sigmoid colon diverticulosis along with some stranding and wall indistinctness of the sigmoid colon which could reflect diverticulitis or colitis. Assessment is made more complex by the proximity of the pelvic mass. 4. Prominence of stool proximal to the sigmoid colon possibly from constipation or low-grade partial obstruction. 5. Other imaging findings of potential clinical significance: Small type 1 hiatal hernia. Old granulomatous disease. Lumbar spondylosis and degenerative disc disease causing impingement at L3-4, L4-5, and L5-S1. Mild rectus diastasis with transverse colon and adipose tissue extending along the diastatic region. 6. Aortic atherosclerosis.   02/27/2020 Echocardiogram   1. Left ventricular ejection fraction, by estimation, is 60 to 65%. The left ventricle has normal function. The left ventricle has no regional wall motion abnormalities. Left ventricular  diastolic parameters are consistent with Grade I diastolic dysfunction (  impaired relaxation). The average left ventricular global longitudinal strain is -18.3 %.  2. Right ventricular systolic function is normal. The right ventricular size is normal. There is normal pulmonary artery systolic pressure. The estimated right ventricular systolic pressure is 41.9 mmHg.  3. The mitral valve is normal in structure. Trivial mitral valve regurgitation.  4. The aortic valve is tricuspid. Aortic valve regurgitation is mild. No aortic stenosis is present.  5. The inferior vena cava is normal in size with greater than 50% respiratory variability, suggesting right atrial pressure of 3 mmHg.   03/04/2020 -  Chemotherapy   The patient had DOXOrubicin (ADRIAMYCIN) chemo injection 150 mg, 75 mg/m2 = 150 mg, Intravenous,  Once, 0 of 4 cycles palonosetron (ALOXI) injection 0.25 mg, 0.25 mg, Intravenous,  Once, 0 of 4 cycles fosaprepitant (EMEND) 150 mg in sodium chloride 0.9 % 145 mL IVPB, 150 mg, Intravenous,  Once, 0 of 4 cycles  for chemotherapy treatment.    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      REVIEW OF SYSTEMS:   Constitutional: Denies fevers, chills or abnormal weight loss Eyes: Denies blurriness of vision Ears, nose, mouth, throat, and face: Denies mucositis or sore throat Respiratory: Denies cough, dyspnea or wheezes Cardiovascular: Denies palpitation, chest discomfort or lower extremity swelling Gastrointestinal: Continues to have abdominal pain/pressure.  Denies  nausea or vomiting.  Reports that she is passing flatus and clear mucus from her rectum. Skin: Denies abnormal skin rashes Lymphatics: Denies new lymphadenopathy or easy bruising Neurological:Denies numbness, tingling or new weaknesses Behavioral/Psych: Mood is stable, no new changes  All other systems were reviewed with the patient and are negative.  I have reviewed the past medical history, past surgical history, social history and family history with the patient and they are unchanged from previous note.  ALLERGIES:  is allergic to bee venom, amlodipine, demerol [meperidine hcl], lisinopril, losartan, and other.  MEDICATIONS:  Current Facility-Administered Medications  Medication Dose Route Frequency Provider Last Rate Last Admin   acetaminophen (TYLENOL) tablet 650 mg  650 mg Oral Q6H PRN Harold Hedge, MD       Or   acetaminophen (TYLENOL) suppository 650 mg  650 mg Rectal Q6H PRN Harold Hedge, MD       ALPRAZolam Duanne Moron) tablet 0.25 mg  0.25 mg Oral Daily PRN Harold Hedge, MD       Chlorhexidine Gluconate Cloth 2 % PADS 6 each  6 each Topical Daily Dwyane Dee, MD   6 each at 03/04/20 0825   diphenhydrAMINE-zinc acetate (BENADRYL) 2-0.1 % cream   Topical BID PRN Lovey Newcomer T, NP   Given at 03/02/20 2042   enoxaparin (LOVENOX) injection 40 mg  40 mg Subcutaneous Q24H Harold Hedge, MD   40 mg at 03/03/20 2208   FLUoxetine (PROZAC) capsule 20 mg  20 mg Oral Daily Harold Hedge, MD   20 mg at 03/03/20 0913   HYDROcodone-acetaminophen (NORCO/VICODIN) 5-325 MG per tablet 1-2 tablet  1-2 tablet Oral Q4H PRN Harold Hedge,  MD   2 tablet at 03/02/20 2354   HYDROmorphone (DILAUDID) injection 1 mg  1 mg Intravenous Q2H PRN Maryanna Shape, NP   1 mg at 03/04/20 0756   ondansetron (ZOFRAN) tablet 4 mg  4 mg Oral Q6H PRN Harold Hedge, MD       Or   ondansetron [facility]) injection 4 mg  4 mg Intravenous Q6H PRN Harold Hedge, MD   4 mg at 03/04/20 0755   polyethylene glycol  (MIRALAX / GLYCOLAX) packet 17 g  17 g Oral BID Harold Hedge, MD   17 g at 03/03/20 2207   simvastatin (ZOCOR) tablet 40 mg  40 mg Oral QHS Harold Hedge, MD   40 mg at 03/03/20 2209   sodium chloride flush (NS) 0.9 % injection 10-40 mL  10-40 mL Intracatheter PRN Dwyane Dee, MD       triamcinolone cream (KENALOG) 0.1 %   Topical BID Lovey Newcomer T, NP   Given at 03/04/20 1014    PHYSICAL EXAMINATION: ECOG PERFORMANCE STATUS: 1 - Symptomatic but completely ambulatory  Vitals:   03/04/20 0159 03/04/20 0602  BP: 140/74 (!) 148/78  Pulse: 81 76  Resp: 17 16  Temp: 97.8 F (36.6 C) 98.1 F (36.7 C)  SpO2: 99% 98%   Filed Weights   03/02/20 0843  Weight: 85 kg    GENERAL:alert, no distress and comfortable ABD: Positive bowel sounds in all 4 quadrants, soft, no tenderness with palpation this morning NEURO: alert & oriented x 3 with fluent speech, no focal motor/sensory deficits  LABORATORY DATA:  I have reviewed the data as listed    Component Value Date/Time   NA 135 03/04/2020 0538   NA 137 10/27/2011 0738   K 4.3 03/04/2020 0538   K 4.1 10/27/2011 0738   CL 97 (L) 03/04/2020 0538   CL 105 10/27/2011 0738   CO2 26 03/04/2020 0538   CO2 26 10/27/2011 0738   GLUCOSE 91 03/04/2020 0538   GLUCOSE 111 (H) 10/27/2011 0738   BUN 6 (L) 03/04/2020 0538   BUN 15 10/27/2011 0738   CREATININE 0.94 03/04/2020 0538   CREATININE 0.96 02/26/2020 1406   CREATININE 1.01 (H) 12/16/2015 1030   CALCIUM 9.5 03/04/2020 0538   CALCIUM 10.0 09/28/2013 0000   PROT 7.7 03/02/2020 1015   PROT 8.1 10/27/2011 0738   ALBUMIN 3.8 03/02/2020 1015   ALBUMIN 3.9 10/27/2011 0738   AST 23 03/02/2020 1015   AST 20 02/26/2020 1406   ALT 15 03/02/2020 1015   ALT 20 02/26/2020 1406   ALT 29 10/27/2011 0738   ALKPHOS 71 03/02/2020 1015   ALKPHOS 102 10/27/2011 0738   BILITOT 0.6 03/02/2020 1015   BILITOT 0.3 02/26/2020 1406   GFRNONAA >60 03/04/2020 0538   GFRNONAA >60 02/26/2020 1406    GFRNONAA 59 (L) 12/16/2015 1030   GFRAA >60 02/18/2020 1306   GFRAA 68 12/16/2015 1030    No results found for: SPEP, UPEP  Lab Results  Component Value Date   WBC 8.2 03/04/2020   NEUTROABS 4.8 03/04/2020   HGB 9.6 (L) 03/04/2020   HCT 28.5 (L) 03/04/2020   MCV 91.1 03/04/2020   PLT 471 (H) 03/04/2020      Chemistry      Component Value Date/Time   NA 135 03/04/2020 0538   NA 137 10/27/2011 0738   K 4.3 03/04/2020 0538   K 4.1 10/27/2011 0738   CL 97 (L) 03/04/2020 4332  CL 105 10/27/2011 0738   CO2 26 03/04/2020 0538   CO2 26 10/27/2011 0738   BUN 6 (L) 03/04/2020 0538   BUN 15 10/27/2011 0738   CREATININE 0.94 03/04/2020 0538   CREATININE 0.96 02/26/2020 1406   CREATININE 1.01 (H) 12/16/2015 1030   GLU 98 09/28/2013 0000      Component Value Date/Time   CALCIUM 9.5 03/04/2020 0538   CALCIUM 10.0 09/28/2013 0000   ALKPHOS 71 03/02/2020 1015   ALKPHOS 102 10/27/2011 0738   AST 23 03/02/2020 1015   AST 20 02/26/2020 1406   ALT 15 03/02/2020 1015   ALT 20 02/26/2020 1406   ALT 29 10/27/2011 0738   BILITOT 0.6 03/02/2020 1015   BILITOT 0.3 02/26/2020 1406       RADIOGRAPHIC STUDIES: I have reviewed her CT imaging myself I have personally reviewed the radiological images as listed and agreed with the findings in the report. CT Abdomen Pelvis W Contrast  Result Date: 03/02/2020 CLINICAL DATA:  Right lower quadrant abdominal pain and sensation of pelvic pressure. History of ovarian cancer. EXAM: CT ABDOMEN AND PELVIS WITH CONTRAST TECHNIQUE: Multidetector CT imaging of the abdomen and pelvis was performed using the standard protocol following bolus administration of intravenous contrast. CONTRAST:  176m OMNIPAQUE IOHEXOL 300 MG/ML  SOLN COMPARISON:  02/21/2020 FINDINGS: Lower chest: 4 mm calcified granuloma in the right lower lobe on image 2 of series 4. 3 mm pleural-based nodule along the right hemidiaphragm on image 7 of series 4 is stable. Other calcified  granulomas are present at the right lung base. Mild linear scarring peripherally at the left lung base is unchanged. Left lower lobe granulomas are also present. Small type 1 hiatal hernia. 0.7 cm lymph node in the right pericardial space on image 11 of series 2, previously 0.5 cm. Hepatobiliary: Trace perihepatic ascites is new.  Cholecystectomy. Pancreas: Unremarkable Spleen: Old granulomatous disease. Adrenals/Urinary Tract: Both adrenal glands appear normal. Worsened right hydronephrosis and hydroureter extending down to a region of extrinsic compression on the ureter just past the iliac vessel cross over. Invasion of the ureter by the pelvic mass is not excluded. The degree of hydronephrosis is now moderate to prominent, likely with some minimal delay in excretion contrast medium. Consider urology consultation for potential stenting or percutaneous nephrostomy. The left kidney appears normal. The urinary bladder is effaced by the pelvic mass. Stomach/Bowel: Small type 1 hiatal hernia. There is prominence of stool throughout the colon extending down to the sigmoid colon. The sigmoid colon itself is effaced by the large pelvic mass and flattened by the mass, and difficult to separate from the margins of the mass. Obstruction or invasion of the sigmoid colon by the mass is a distinct possibility. Vascular/Lymphatic: Aortoiliac atherosclerotic vascular disease. Portacaval node 1.2 cm in short axis on image 22 of series 2, previously 0.9 cm. Stable 0.7 cm left common iliac node on image 48 of series 2. Reproductive: Large multilobular pelvic mass or abscess is identified. The posterior portion measures 11.3 by 7.2 by 8.1 cm (volume = 350 cm^3) today and previously measured 9.4 by 6.0 by 7.0 cm (volume = 210 cm^3) ten days ago. The more anterior portion measures about 9.0 by 5.0 by 11.1 cm (volume = 260 cm^3) today and previously measured about 5.9 by 3.3 by 6.8 cm (volume = 69 cm^3) ten days ago. These collections  appear continuous and sits just above the vaginal cuff. Other: As noted above, there is trace perihepatic ascites, increased from prior.  Stable mild presacral edema. Musculoskeletal: Stable rectus diastasis with a small amount of the transverse colon bulging into the diastatic region. Stable lumbar spondylosis and degenerative disc disease. Right hip prosthesis noted. IMPRESSION: 1. Rapid enlargement of complex pelvic mass lesion over the last 10 days, causing increased obstruction of the adherent sigmoid colon and increased obstruction of the right ureter. Possibilities include rapidly growing tumor or abscess. It would be somewhat unusual for abscess to cause this degree of ureteral and bowel obstruction. 2. Trace perihepatic ascites, increased from prior. 3. Other imaging findings of potential clinical significance: Small type 1 hiatal hernia. Stable rectus diastasis with a small amount of the transverse colon bulging into the diastatic region. Stable lumbar spondylosis and degenerative disc disease. 4. Aortic atherosclerosis. Aortic Atherosclerosis (ICD10-I70.0). Electronically Signed   By: Van Clines M.D.   On: 03/02/2020 12:55   CT ABDOMEN PELVIS W CONTRAST  Result Date: 02/21/2020 CLINICAL DATA:  Ovarian cancer restaging. EXAM: CT ABDOMEN AND PELVIS WITH CONTRAST TECHNIQUE: Multidetector CT imaging of the abdomen and pelvis was performed using the standard protocol following bolus administration of intravenous contrast. CONTRAST:  164m OMNIPAQUE IOHEXOL 300 MG/ML  SOLN COMPARISON:  Multiple exams, including 10/10/2019 FINDINGS: Lower chest: Stable calcified granulomas in both lower lobes. Small type 1 hiatal hernia. Hepatobiliary: Cholecystectomy. Common bile duct measures 0.7 cm in diameter, borderline prominent but likely a physiologic response to cholecystectomy. No intrahepatic biliary dilatation. Pancreas: Unremarkable Spleen: Punctate calcifications compatible with old granulomatous  disease. Adrenals/Urinary Tract: Mild right hydronephrosis and hydroureter extending down to the level of the recurrent pelvic mass. Distal to the iliac vessel cross over the right ovary is obscured by the mass, probably from either extrinsic mass effect or ureteral invasion by the mass. Visualization of the distal ureter and parts of the urinary bladder also limited by the streak artifact from the right total hip prosthesis. No significant delayed nephrogram. The left kidney and adrenal glands appear normal. Stomach/Bowel: Sigmoid diverticulosis along with indistinctness of the margins of the distal sigmoid colon suggesting inflammation. Close association of the distal sigmoid colon and anterior rectum with the pelvic mass. Prominence of stool proximal to the sigmoid colon possibly from constipation or low-grade partial obstruction. No dilated small bowel. Vascular/Lymphatic: Aortoiliac atherosclerotic vascular disease. Right external iliac node 0.7 cm in short axis on image 58 series 2, previously 0.4 cm. Reproductive: Complex cystic mass versus less likely complex abscess along the vaginal cuff and extending anterior to the rectum and along the right lateral side of the rectosigmoid junction, with components of the process extending up towards the adnexa, right greater than left. No internal gas density. Involved region measures proximally 9.0 by 8.5 by 10.9 cm with complex internal elements. Other: Mildly increased presacral edema along with mild stranding tracking in the perirectal space and into the lower retroperitoneum. Musculoskeletal: Right total hip prosthesis. Lumbar spondylosis and degenerative disc disease causing impingement at L3-4, L4-5, and L5-S1. Mild rectus diastasis with transverse colon and adipose tissue extending along the diastatic region. IMPRESSION: 1. Complex cystic mass versus less likely complex abscess along the vaginal cuff and extending anterior to the rectum and along the right  lateral side of the rectosigmoid junction, with components of the process extending up towards the adnexa, right greater than left. This measures up to 10.9 cm in diameter. If this represents an abscess I would expect the patient have striking symptoms (fever, pain, etc.). Given the patient's history, the appearance tends to favor recurrent ovarian malignancy over  abscess. 2. Mild right hydronephrosis and hydroureter extending down to the level of the recurrent pelvic mass, indicating some low-grade obstruction. 3. There is sigmoid colon diverticulosis along with some stranding and wall indistinctness of the sigmoid colon which could reflect diverticulitis or colitis. Assessment is made more complex by the proximity of the pelvic mass. 4. Prominence of stool proximal to the sigmoid colon possibly from constipation or low-grade partial obstruction. 5. Other imaging findings of potential clinical significance: Small type 1 hiatal hernia. Old granulomatous disease. Lumbar spondylosis and degenerative disc disease causing impingement at L3-4, L4-5, and L5-S1. Mild rectus diastasis with transverse colon and adipose tissue extending along the diastatic region. 6. Aortic atherosclerosis. Aortic Atherosclerosis (ICD10-I70.0). Electronically Signed   By: Van Clines M.D.   On: 02/21/2020 09:53   DG BONE DENSITY (DXA)  Result Date: 02/08/2020 EXAM: DUAL X-RAY ABSORPTIOMETRY (DXA) FOR BONE MINERAL DENSITY IMPRESSION: Referring Physician:  Michael Boston Your patient completed a BMD test using Lunar IDXA DXA system ( analysis version: 16 ) manufactured by [employer]. Technologist: AW PATIENT: Name: Ellanore, Vanhook Patient ID: [id number] Birth Date: 02-08-1951 Height: 66.0 in. Sex: Female Measured: 02/08/2020 Weight: 190.2 lbs. Indications: Bilateral Ovariectomy (65.51), Caucasian, Estrogen Deficient, Hysterectomy, Postmenopausal, Prozac, Right hip replacement Fractures: None Treatments: Vitamin D (E933.5)  ASSESSMENT: The BMD measured at Forearm Radius 33% is 0.872 g/cm2 with a T-score of -0.2. This patient is considered normal according to [facility]) criteria. The scan quality is good. L-3 and L-4 were excluded due to degenerative changes.Right femur was excluded due to surgical hardware. Site Region Measured Date Measured Age [age]A BMD Significant CHANGE T-score Left Forearm Radius 33% 3/08/[age] 67.9 -1.2 0.872 g/cm2 AP Spine L1-L2 3/08/[age] 67.9 2.3 1.423 g/cm2 Left Femur Neck 3/08/[age] 67.9 1.3 1.206 g/cm2 Left Femur Total 3/08/2020 67.9 1.5 1.182 g/cm2 World Health Organization [facility]) criteria for post-menopausal, Caucasian Women: Normal       T-score at or above -1 SD Osteopenia   T-score between -1 and -2.5 SD Osteoporosis T-score at or below -2.5 SD RECOMMENDATION: 1. All patients should optimize calcium and vitamin D intake. 2. Consider FDA approved medical therapies in postmenopausal women and men aged 40 years and older, based on the following: a. A hip or vertebral (clinical or morphometric) fracture b. T- score < or = -2.5 at the femoral neck or spine after appropriate evaluation to exclude secondary causes c. Low bone mass (T-score between -1.0 and -2.5 at the femoral neck or spine) and a 10 year probability of a hip fracture > or = 3% or a 10 year probability of a major osteoporosis-related fracture > or = 20% based on the [place]-adapted WHO algorithm d. Clinician judgment and/or patient preferences may indicate treatment for people with 10-year fracture probabilities above or below these levels FOLLOW-UP: Patients with diagnosis of osteoporosis or at high risk for fracture should have regular bone mineral density tests. For patients eligible for Medicare, routine testing is allowed once every 2 years. The testing frequency can be increased to one year for patients who have rapidly progressing disease, those who are receiving or discontinuing medical therapy to restore bone mass, or  have additional risk factors. I have reviewed this report and agree with the above findings. [facility] Radiology Electronically Signed   By: Lowella Grip III M.D.   On: 02/08/2020 14:20   DG Abd 2 Views  Result Date: 02/18/2020 CLINICAL DATA:  Abdominal pain, history of ovarian carcinoma EXAM: ABDOMEN - 2  VIEW COMPARISON:  10/10/2019 FINDINGS: Scattered large and small bowel gas is noted. No abnormal mass or abnormal calcifications are seen. Changes of prior right hip replacement and cholecystectomy are noted. Mild degenerative changes of lumbar spine are seen. IMPRESSION: No acute abnormality noted. Electronically Signed   By: Inez Catalina M.D.   On: 02/18/2020 21:13   ECHOCARDIOGRAM COMPLETE  Result Date: 02/27/2020    ECHOCARDIOGRAM REPORT   Patient Name:   NAJE RICE Date of Exam: 02/27/2020 Medical Rec #:  [id number]        Height:       67.0 in Accession #:    [id number]       Weight:       187.2 lb Date of Birth:  Oct 05, 1950       BSA:          1.966 m Patient Age:    63 years         BP:           162/82 mmHg Patient Gender: F                HR:           77 bpm. Exam Location:  Outpatient Procedure: 2D Echo, Color Doppler, Cardiac Doppler and Strain Analysis Indications:    Pre-Chemo Evaluation v58.11  History:        Patient has no prior history of Echocardiogram examinations.                 Risk Factors:Hypertension and Dyslipidemia.  Sonographer:    Raquel Sarna Senior RDCS Referring Phys: [phone] Beulah Capobianco [place]  1. Left ventricular ejection fraction, by estimation, is 60 to 65%. The left ventricle has normal function. The left ventricle has no regional wall motion abnormalities. Left ventricular diastolic parameters are consistent with Grade I diastolic dysfunction (impaired relaxation). The average left ventricular global longitudinal strain is -18.3 %.  2. Right ventricular systolic function is normal. The right ventricular size is normal. There is normal pulmonary artery  systolic pressure. The estimated right ventricular systolic pressure is 87.6 mmHg.  3. The mitral valve is normal in structure. Trivial mitral valve regurgitation.  4. The aortic valve is tricuspid. Aortic valve regurgitation is mild. No aortic stenosis is present.  5. The inferior vena cava is normal in size with greater than 50% respiratory variability, suggesting right atrial pressure of 3 mmHg. FINDINGS  Left Ventricle: Left ventricular ejection fraction, by estimation, is 60 to 65%. The left ventricle has normal function. The left ventricle has no regional wall motion abnormalities. The average left ventricular global longitudinal strain is -18.3 %. The left ventricular internal cavity size was normal in size. There is no left ventricular hypertrophy. Left ventricular diastolic parameters are consistent with Grade I diastolic dysfunction (impaired relaxation). Right Ventricle: The right ventricular size is normal. Right vetricular wall thickness was not assessed. Right ventricular systolic function is normal. There is normal pulmonary artery systolic pressure. The tricuspid regurgitant velocity is 2.46 m/s, and with an assumed right atrial pressure of 3 mmHg, the estimated right ventricular systolic pressure is 81.1 mmHg. Left Atrium: Left atrial size was normal in size. Right Atrium: Right atrial size was normal in size. Pericardium: Trivial pericardial effusion is present. Mitral Valve: The mitral valve is normal in structure. Trivial mitral valve regurgitation. Tricuspid Valve: The tricuspid valve is normal in structure. Tricuspid valve regurgitation is mild. Aortic Valve: The aortic valve is tricuspid. Aortic valve regurgitation is mild. No  aortic stenosis is present. Pulmonic Valve: The pulmonic valve was not well visualized. Pulmonic valve regurgitation is trivial. Aorta: The aortic root and ascending aorta are structurally normal, with no evidence of dilitation. Venous: The inferior vena cava is normal  in size with greater than 50% respiratory variability, suggesting right atrial pressure of 3 mmHg. IAS/Shunts: No atrial level shunt detected by color flow Doppler.  LEFT VENTRICLE PLAX 2D LVIDd:         4.40 cm  Diastology LVIDs:         2.50 cm  LV e' medial:    0.06 cm/s LV PW:         0.80 cm  LV E/e' medial:  11.2 LV IVS:        0.80 cm  LV e' lateral:   0.06 cm/s LVOT diam:     1.90 cm  LV E/e' lateral: 11.0 LV SV:         45 LV SV Index:   23       2D Longitudinal Strain LVOT Area:     2.84 cm 2D Strain GLS Avg:     -18.3 %  RIGHT VENTRICLE RV S prime:     14.80 cm/s TAPSE (M-mode): 1.8 cm LEFT ATRIUM             Index       RIGHT ATRIUM           Index LA diam:        3.20 cm 1.63 cm/m  RA Area:     11.20 cm LA Vol (A2C):   23.5 ml 11.95 ml/m RA Volume:   23.90 ml  12.16 ml/m LA Vol (A4C):   40.5 ml 20.60 ml/m LA Biplane Vol: 31.9 ml 16.22 ml/m  AORTIC VALVE LVOT Vmax:   78.30 cm/s LVOT Vmean:  52.400 cm/s LVOT VTI:    0.160 m  AORTA Ao Root diam: 3.20 cm Ao Asc diam:  3.10 cm MITRAL VALVE               TRICUSPID VALVE MV Area (PHT): 3.17 cm    TR Peak grad:   24.2 mmHg MV Decel Time: 239 msec    TR Vmax:        246.00 cm/s MV E velocity: 0.70 cm/s MV A velocity: 83.90 cm/s  SHUNTS MV E/A ratio:  0.01        Systemic VTI:  0.16 m                            Systemic Diam: 1.90 cm Oswaldo Milian MD Electronically signed by Oswaldo Milian MD Signature Date/Time: 02/27/2020/11:21:25 AM    Final

## 2020-03-05 ENCOUNTER — Encounter (HOSPITAL_COMMUNITY): Payer: Self-pay | Admitting: Gynecologic Oncology

## 2020-03-05 DIAGNOSIS — K56609 Unspecified intestinal obstruction, unspecified as to partial versus complete obstruction: Secondary | ICD-10-CM | POA: Diagnosis not present

## 2020-03-05 LAB — BASIC METABOLIC PANEL
Anion gap: 7 (ref 5–15)
BUN: 8 mg/dL (ref 8–23)
CO2: 26 mmol/L (ref 22–32)
Calcium: 8.5 mg/dL — ABNORMAL LOW (ref 8.9–10.3)
Chloride: 96 mmol/L — ABNORMAL LOW (ref 98–111)
Creatinine, Ser: 0.93 mg/dL (ref 0.44–1.00)
GFR, Estimated: >60 mL/min (ref >60)
Glucose, Bld: 148 mg/dL — ABNORMAL HIGH (ref 70–99)
Potassium: 4.3 mmol/L (ref 3.5–5.1)
Sodium: 129 mmol/L — ABNORMAL LOW (ref 135–145)

## 2020-03-05 LAB — CBC
HCT: 26.2 % — ABNORMAL LOW (ref 36.0–46.0)
Hemoglobin: 8.8 g/dL — ABNORMAL LOW (ref 12.0–15.0)
MCH: 31 pg (ref 26.0–34.0)
MCHC: 33.6 g/dL (ref 30.0–36.0)
MCV: 92.3 fL (ref 80.0–100.0)
Platelets: 440 10*3/uL — ABNORMAL HIGH (ref 150–400)
RBC: 2.84 MIL/uL — ABNORMAL LOW (ref 3.87–5.11)
RDW: 13.6 % (ref 11.5–15.5)
WBC: 13.1 10*3/uL — ABNORMAL HIGH (ref 4.0–10.5)
nRBC: 0 % (ref 0.0–0.2)

## 2020-03-05 MED ORDER — OXYCODONE HCL 5 MG PO TABS
5.0000 mg | ORAL_TABLET | ORAL | Status: DC | PRN
  Administered 2020-03-05: 10 mg via ORAL
  Filled 2020-03-05: qty 2

## 2020-03-05 MED ORDER — CHLORHEXIDINE GLUCONATE CLOTH 2 % EX PADS
6.0000 | MEDICATED_PAD | Freq: Every day | CUTANEOUS | Status: DC
  Administered 2020-03-05 – 2020-03-06 (×2): 6 via TOPICAL

## 2020-03-05 MED ORDER — SODIUM CHLORIDE 0.9% FLUSH
10.0000 mL | Freq: Two times a day (BID) | INTRAVENOUS | Status: DC
  Administered 2020-03-05 – 2020-03-06 (×2): 10 mL

## 2020-03-05 MED ORDER — HYDROMORPHONE HCL 1 MG/ML IJ SOLN
1.0000 mg | INTRAMUSCULAR | Status: DC | PRN
  Administered 2020-03-05 – 2020-03-06 (×3): 1 mg via INTRAVENOUS
  Filled 2020-03-05 (×3): qty 1

## 2020-03-05 MED ORDER — SODIUM CHLORIDE 0.9% FLUSH
10.0000 mL | INTRAVENOUS | Status: DC | PRN
  Administered 2020-03-06: 10 mL

## 2020-03-05 NOTE — Progress Notes (Signed)
1 Day Post-Op Procedure(s) (LRB): EXPLORATORY LAPAROTOMY WITH TRANSVERSE LOOPCOLOSTOMY (N/A) HERNIA REPAIR VENTRAL ADULT (N/A)  Subjective: Patient reports having a rough night due to pain issues. She states the 0.5 mg of dilaudid did not offer adequate pain relief but preoperatively the 1 mg of IV dilaudid help controlled her pain. She would also like a kpad. Had nausea last pm but none this am and no emesis.  Can feel gas moving into her ostomy. Foley was removed this am. Participated in am care this am. Would like her peripheral IV removed and use her port a cath.   Objective:  Vital signs in last 24 hours: Temp:  [97.7 F (36.5 C)-98.9 F (37.2 C)] 98.6 F (37 C) (10/20 0612) Pulse Rate:  [82-96] 85 (10/20 0612) Resp:  [13-17] 16 (10/20 0612) BP: (144-169)/(74-93) 146/74 (10/20 0612) SpO2:  [93 %-100 %] 97 % (10/20 0612) Last BM Date: 03/05/20  Intake/Output from previous day: 10/19 0701 - 10/20 0700 In: 4761.2 [P.O.:720; I.V.:3941.2; IV Piggyback:100] Out: 1920 [Urine:1800; Stool:20; Blood:100]  Physical Examination: General: alert, cooperative and no distress Resp: clear to auscultation bilaterally Cardio: regular rate and rhythm, S1, S2 normal, no murmur, click, rub or gallop GI: soft, non-tender; bowel sounds normal; no masses,  no organomegaly and incision: midline incision with op site dressing in place with no drainage noted underneath Extremities: extremities normal, atraumatic, no cyanosis or edema  Colostomy intact with liquid brown stool. Flatus in the bag. Stoma dark pink, moist, intact.  Labs: WBC/Hgb/Hct/Plts:  13.1/8.8/26.2/440 (10/20 0550) BUN/Cr/glu/ALT/AST/amyl/lip:  8/0.93/--/--/--/--/-- (10/20 0550)  Assessment: 69 y.o. s/p Procedure(s): EXPLORATORY LAPAROTOMY WITH TRANSVERSE LOOPCOLOSTOMY HERNIA REPAIR VENTRAL ADULT: stable Pain:  Pain is not well-controlled on PRN medications. Dilaudid IV adjusted to 1 mg Q4H PRN.  Heme: Hgb 8.8 and Hct 26.2 this am.  Preop Hgb 9.6 Hct 28.5. Am labs ordered.  ID: Cefoxitin received during surgery. WBC 13.1 this am-received dexamethasone during surgery. Will check in am.   CV: BP and HR stable. Continue to monitor with ordered vital sign assessments.  GI:  Tolerating po: Yes. Output from ostomy.  GU: Foley removed this am. Creatinine 0.93 this am.  FEN: No critical values this am.  Prophylaxis: Lovenox and SCDs ordered  Plan: IV dilaudid changed to 1 mg PRN Kpad Saline lock IV. Peripheral IV can be removed and keep port accessed and capped in case of need. AM labs Ostomy teaching RN to bring IS to the room Encourage ambulation, IS use, deep breathing, and coughing Continue plan of care per Connie Heath   LOS: 3 days    Connie Heath 03/05/2020, 9:11 AM

## 2020-03-05 NOTE — Progress Notes (Signed)
Connie Heath   DOB:10/23/1950   GG#:[id number]    ASSESSMENT & PLAN:  Ovarian cancer on right [facility]) PLan for outpatient chemo next week  Bowel obstruction secondary to pelvic mass S/p loop colostomy She is managing well  Cancer associated pain Pelvic pain controlled at this time  Anemia, chronic disease Transfuse for hemoglobin less than 8  Hydronephrosis of right kidney She was evaluated by urologist recently I have discussed this case with him and reviewed his documentation Given her preserved renal function, she is recommended for close observation for now I agreed not to proceed with stent placement We will monitor her renal function carefully  Goals of care, counseling/discussion She does not want to be resuscitated -DNR order in place  Discharge planning Hopefully DC next few days, will defer to primary service All questions were answered. The patient knows to call the clinic with any problems, questions or concerns.   [name], MD 03/05/2020 1:35 PM  Subjective:  She is doing well postoperatively I reviewed her surgical report No nausea Pain was not controlled last night, better now Able to tolerate PO diet and walk a bit  Objective:  Vitals:   03/05/20 0124 03/05/20 0612  BP: (!) 148/81 (!) 146/74  Pulse: 82 85  Resp: 15 16  Temp: 98.6 F (37 C) 98.6 F (37 C)  SpO2: 95% 97%     Intake/Output Summary (Last 24 hours) at 03/05/2020 1335 Last data filed at 03/05/2020 1233 Gross per 24 hour  Intake 4521.22 ml  Output 1820 ml  Net 2701.22 ml    GENERAL:alert, no distress and comfortable. She looks pale ABDOMEN:abdomen soft, colostomy working well   Labs:  Recent Labs    10/10/19 0927 10/10/19 0927 01/02/20 1310 01/02/20 1310 02/18/20 1306 02/18/20 1306 02/26/20 1406 02/26/20 1406 03/02/20 1015 03/02/20 1015 03/03/20 0638 03/04/20 0538 03/05/20 0550  NA 137   < > 133*   < > 133*   < > 132*   < > 131*  --   --  135 129*  K  4.2   < > 4.2   < > 4.2   < > 4.5   < > 3.7  --   --  4.3 4.3  CL 102   < > 99   < > 99   < > 95*   < > 93*  --   --  97* 96*  CO2 25   < > 24   < > 28   < > 30   < > 23  --   --  26 26  GLUCOSE 96   < > 84   < > 88   < > 99   < > 107*  --   --  91 148*  BUN 15   < > 18   < > 9   < > 8   < > 8  --   --  6* 8  CREATININE 0.95   < > 1.12*   < > 0.83   < > 0.96  --  0.82   < > 0.96 0.94 0.93  CALCIUM 9.9   < > 10.3   < > 10.1   < > 10.4*   < > 9.5  --   --  9.5 8.5*  GFRNONAA >60   < > 51*   < > >60   < > >60  --  >60   < > >60 >60 >60  GFRAA >60  --  59*  --  >60  --   --   --   --   --   --   --   --   PROT 7.6   < > 7.4   < > 7.4  --  8.2*  --  7.7  --   --   --   --   ALBUMIN 3.9   < > 3.9   < > 3.3*  --  3.6  --  3.8  --   --   --   --   AST 19   < > 28   < > 28  --  20  --  23  --   --   --   --   ALT 20   < > 37   < > 31  --  20  --  15  --   --   --   --   ALKPHOS 76   < > 75   < > 94  --  106  --  71  --   --   --   --   BILITOT 0.4   < > 0.3   < > 0.5  --  0.3  --  0.6  --   --   --   --    < > = values in this interval not displayed.

## 2020-03-05 NOTE — Discharge Instructions (Addendum)
03/05/2020  Return to work: 4-6 weeks if applicable  You have steri strips on your incision that will peel off on their own. We will see you in the office on Monday, October 25 for a post-op check prior to starting chemotherapy.  You can start taking ferrous gluconate once a day and see how your stomach can handle it. You do not need to take senna-kot S currently. Base Miralax use on how ostomy output is.  Activity: 1. Be up and out of the bed during the day.  Take a nap if needed.  You may walk up steps but be careful and use the hand rail.  Stair climbing will tire you more than you think, you may need to stop part way and rest.   2. No lifting or straining for 6 weeks. No pushing, pulling, straining for 6 weeks.  3. No driving for 2 week(s) from surgery.  Do not drive if you are taking narcotic pain medicine.  4. Shower daily.  Use soap and water around your incision and pat dry; don't rub.  No tub baths until cleared by your surgeon.   5. You may experience a small amount of clear drainage from your incision, which is normal.  If the drainage persists or increases, please call the office.  6. Take Tylenol or ibuprofen first for pain and only use narcotic pain medication for severe pain not relieved by the Tylenol or Ibuprofen.  Monitor your Tylenol intake to a max of 4,000 mg.  Diet: 1. Low sodium Heart Healthy Diet is recommended.  Wound Care: 1. Keep clean and dry.  Shower daily.  Reasons to call the Doctor:  Fever - Oral temperature greater than 100.4 degrees Fahrenheit  Foul-smelling vaginal discharge  Difficulty urinating  Nausea and vomiting  Increased pain at the site of the incision that is unrelieved with pain medicine.  Difficulty breathing with or without chest pain  New calf pain especially if only on one side  Sudden, continuing increased vaginal bleeding with or without clots.   Contacts: For questions or concerns you should contact:  Dr. Everitt Amber  at [phone]  [name], NP at [phone]  After Hours: call [phone] and have the GYN Oncologist paged/contacted   Colostomy Home Guide, Adult   Colostomy surgery is done to create an opening in the front of the abdomen for stool (feces) to leave the body through an ostomy (stoma). Part of the large intestine is attached to the stoma. A bag, also called a pouch, is fitted over the stoma. Stool and gas will collect in the bag. After surgery, you will need to empty and change your colostomy bag as needed. You will also need to care for your stoma. How to care for the stoma Your stoma should look pink, red, and moist, like the inside of your cheek. Soon after surgery, the stoma may be swollen, but this swelling will go away within 6 weeks. To care for the stoma:  Keep the skin around the stoma clean and dry.  Use a clean, soft washcloth to gently wash the stoma and the skin around it. Clean using a circular motion, and wipe away from the stoma opening, not toward it. ? Use warm water and only use cleansers recommended by your health care provider. ? Rinse the stoma area with plain water. ? Dry the area around the stoma well.  Use stoma powder or ointment on your skin only as told by your health care provider. Do not  use any other powders, gels, wipes, or creams on the skin around the stoma.  Check the stoma area every day for signs of infection. Check for: ? New or worsening redness, swelling, or pain. ? New or increased fluid or blood. ? Pus or warmth.  Measure the stoma opening regularly and record the size. Watch for changes. (It is normal for the stoma to get smaller as swelling goes away.) Share this information with your health care provider. How to empty the colostomy bag  Empty your bag at bedtime and whenever it is one-third to one-half full. Do not let the bag get more than half-full with stool or gas. The bag could leak if it gets too full. Some colostomy bags  have a built-in gas release valve that releases gas often throughout the day. Follow these basic steps: 1. Wash your hands with soap and water. 2. Sit far back on the toilet seat. 3. Put several pieces of toilet paper into the toilet water. This will prevent splashing as you empty stool into the toilet. 4. Remove the clip or the hook-and-loop fastener from the tail end of the bag. 5. Unroll the tail, then empty the stool into the toilet. 6. Clean the tail with toilet paper or a moist towelette. 7. Reroll the tail, and close it with the clip or the hook-and-loop fastener. 8. Wash your hands again. How to change the colostomy bag Change your bag every 3-4 days or as often as told by your health care provider. Also change the bag if it is leaking or separating from the skin, or if your skin around the stoma looks or feels irritated. Irritated skin may be a sign that the bag is leaking. Always have colostomy supplies with you, and follow these basic steps: 1. Wash your hands with soap and water. Have paper towels or tissues nearby to clean any discharge. 2. Remove the old bag and skin barrier. Use your fingers or a warm cloth to gently push the skin away from the barrier. 3. Clean the stoma area with water or with mild soap and water, as directed. Use water to rinse away any soap. 4. Dry the skin. You may use the cool setting on a hair dryer to do this. 5. Use a tracing pattern (template) to cut the skin barrier to the size needed. 6. If you are using a two-piece bag, attach the bag and the skin barrier to each other. Add the barrier ring, if you use one. 7. If directed, apply stoma powder or skin barrier gel to the skin. 8. Warm the skin barrier with your hands, or blow with a hair dryer for 5-10 seconds. 9. Remove the paper from the adhesive strip of the skin barrier. 10. Press the adhesive strip onto the skin around the stoma. 11. Gently rub the skin barrier onto the skin. This creates heat  that helps the barrier to stick. 12. Apply stoma tape to the edges of the skin barrier, if desired. 5. Wash your hands again. General recommendations  Avoid wearing tight clothes or having anything press directly on your stoma or bag. Change your clothing whenever it is soiled or damp.  You may shower or bathe with the bag on or off. Do not use harsh or oily soaps or lotions. Dry the skin and bag after bathing.  Store all supplies in a cool, dry place. Do not leave supplies in extreme heat because some parts can melt or not stick as well.  Whenever you  leave home, take extra clothing and an extra skin barrier and bag with you.  If your bag gets wet, you can dry it with a hair dryer on the cool setting.  To prevent odor, you may put drops of ostomy deodorizer in the bag.  If recommended by your health care provider, put ostomy lubricant inside the bag. This helps stool to slide out of the bag more easily and completely. Contact a health care provider if:  You have new or worsening redness, swelling, or pain around your stoma.  You have new or increased fluid or blood coming from your stoma.  Your stoma feels warm to the touch.  You have pus coming from your stoma.  Your stoma extends in or out farther than normal.  You need to change your bag every day.  You have a fever. Get help right away if:  Your stool is bloody.  You have nausea or you vomit.  You have trouble breathing. Summary  Measure your stoma opening regularly and record the size. Watch for changes.  Empty your bag at bedtime and whenever it is one-third to one-half full. Do not let the bag get more than half-full with stool or gas.  Change your bag every 3-4 days or as often as told by your health care provider.  Whenever you leave home, take extra clothing and an extra skin barrier and bag with you. This information is not intended to replace advice given to you by your health care provider. Make sure  you discuss any questions you have with your health care provider. Document Revised: 08/23/2018 Document Reviewed: 10/27/2016 Elsevier Patient Education  [place].

## 2020-03-05 NOTE — Care Management Important Message (Signed)
Important Message  Patient Details IM Letter given to the Patient Name: Connie Heath MRN: [id number] Date of Birth: 1950/09/25   Medicare Important Message Given:  Yes     Kerin Salen 03/05/2020, 11:11 AM

## 2020-03-06 ENCOUNTER — Other Ambulatory Visit (HOSPITAL_COMMUNITY): Payer: Self-pay | Admitting: Gynecologic Oncology

## 2020-03-06 DIAGNOSIS — D638 Anemia in other chronic diseases classified elsewhere: Secondary | ICD-10-CM

## 2020-03-06 DIAGNOSIS — K56609 Unspecified intestinal obstruction, unspecified as to partial versus complete obstruction: Secondary | ICD-10-CM | POA: Diagnosis not present

## 2020-03-06 LAB — CBC
HCT: 24.2 % — ABNORMAL LOW (ref 36.0–46.0)
Hemoglobin: 8.2 g/dL — ABNORMAL LOW (ref 12.0–15.0)
MCH: 31.1 pg (ref 26.0–34.0)
MCHC: 33.9 g/dL (ref 30.0–36.0)
MCV: 91.7 fL (ref 80.0–100.0)
Platelets: 386 10*3/uL (ref 150–400)
RBC: 2.64 MIL/uL — ABNORMAL LOW (ref 3.87–5.11)
RDW: 13.7 % (ref 11.5–15.5)
WBC: 9.9 10*3/uL (ref 4.0–10.5)
nRBC: 0 % (ref 0.0–0.2)

## 2020-03-06 LAB — BASIC METABOLIC PANEL
Anion gap: 8 (ref 5–15)
BUN: 11 mg/dL (ref 8–23)
CO2: 25 mmol/L (ref 22–32)
Calcium: 8.2 mg/dL — ABNORMAL LOW (ref 8.9–10.3)
Chloride: 99 mmol/L (ref 98–111)
Creatinine, Ser: 1.01 mg/dL — ABNORMAL HIGH (ref 0.44–1.00)
GFR, Estimated: 57 mL/min — ABNORMAL LOW (ref >60)
Glucose, Bld: 100 mg/dL — ABNORMAL HIGH (ref 70–99)
Potassium: 3.8 mmol/L (ref 3.5–5.1)
Sodium: 132 mmol/L — ABNORMAL LOW (ref 135–145)

## 2020-03-06 LAB — SURGICAL PATHOLOGY

## 2020-03-06 MED ORDER — ENOXAPARIN (LOVENOX) PATIENT EDUCATION KIT
PACK | Freq: Once | Status: AC
  Filled 2020-03-06: qty 1

## 2020-03-06 MED ORDER — ENOXAPARIN SODIUM 40 MG/0.4ML ~~LOC~~ SOLN: 40.0000 mg | SUBCUTANEOUS | 0 refills | Status: DC

## 2020-03-06 MED ORDER — HEPARIN SOD (PORK) LOCK FLUSH 100 UNIT/ML IV SOLN
500.0000 [IU] | INTRAVENOUS | Status: AC | PRN
  Administered 2020-03-06: 500 [IU]
  Filled 2020-03-06: qty 5

## 2020-03-06 MED ORDER — FERROUS GLUCONATE 324 (38 FE) MG PO TABS
324.0000 mg | ORAL_TABLET | Freq: Every day | ORAL | 1 refills | Status: DC
Start: 2020-03-06 — End: 2020-03-25

## 2020-03-06 MED FILL — ENOXAPARIN SODIUM 40 MG/0.4: 40 | 26 days supply | Qty: 10 | Fill #0

## 2020-03-06 MED FILL — FERROUS GLUCONATE 324 MG TA: 324 (38 FE) | 30 days supply | Qty: 30 | Fill #0

## 2020-03-06 NOTE — Discharge Summary (Addendum)
Physician Discharge Summary  Patient ID: Connie Heath MRN: [id number] DOB/AGE: October 29, 1950 69 y.o.  Admit date: 03/02/2020 Discharge date: 03/06/2020  Admission Diagnoses: Bowel obstruction [facility])  Discharge Diagnoses:  Principal Problem:   Bowel obstruction (HCC) Active Problems:   Depression   Ovarian cancer on right (HCC)   Constipation   Anemia, chronic disease   Hydronephrosis of right kidney   Cancer related pain   Ovarian cancer [facility])   Discharged Condition:  The patient is in good condition and stable for discharge.   Hospital Course: Connie Heath is a 69 year old female with recurrent carcinosarcoma of the right ovary with primary therapy completed in April 2021 with recurrence diagnosed in Sept 2021. She presented to the ER on 03/02/2020 with abdominal pain and constipation. CT imaging on 03/02/20 showed rapid enlargement of complex pelvic mass lesion over the last 10 days, causing increased obstruction of the adherent sigmoid colon and increased obstruction of the right ureter. Possibilities include rapidly growing tumor or abscess. It would be somewhat unusual for abscess to cause this degree of ureteral and bowel obstruction.  On 03/04/2020, the patient underwent the following: Procedure(s): EXPLORATORY LAPAROTOMY WITH TRANSVERSE LOOP COLOSTOMY, HERNIA REPAIR VENTRAL ADULT with Dr. Everitt Amber. The postoperative course was uneventful.  She was discharged to home on postoperative day 2 tolerating a regular diet, voiding adequate amounts, appropriate output from ostomy, pain controlled with oral medications, caring for ostomy independently. She is set up for home health RN for ostomy support. She is scheduled for follow up appointments in the office with GYN ONC on Monday and on Tuesday with Dr. Alvy Bimler for evaluation and blood transfusion prior to starting treatment. She is discharged home with once daily lovenox injections for a total of 4 weeks.   Consults: Wound ostomy  nurse  Significant Diagnostic Studies: CT imaging, Labs  Treatments: surgery: see above  Discharge Exam: Blood pressure (!) 146/69, pulse 81, temperature 98.3 F (36.8 C), temperature source Oral, resp. rate 17, height 5\' 7"  (1.702 m), weight 187 lb 6.3 oz (85 kg), SpO2 97 %. General appearance: alert, cooperative and no distress Resp: clear to auscultation bilaterally Cardio: irregular beat noted every 10 seconds GI: soft, non-tender; bowel sounds normal; no masses,  no organomegaly Extremities: extremities normal, atraumatic, no cyanosis or edema Incision/Wound: Midline incision with steri strips in place. Op site dressing removed by WO nurse earlier. Incision is under the ostomy appliance. No drainage noted.  Ostomy appliance intact with no leakage noted. Output with flatus in appliance with stoma pink/moist Port a cath to right chest accessed-RN notified for deaccessing prior to discharge  Disposition: Discharge disposition: 06-Home-Health Care Svc       Discharge Instructions    Call MD for:  difficulty breathing, headache or visual disturbances   Complete by: As directed    Call MD for:  extreme fatigue   Complete by: As directed    Call MD for:  hives   Complete by: As directed    Call MD for:  persistant dizziness or light-headedness   Complete by: As directed    Call MD for:  persistant nausea and vomiting   Complete by: As directed    Call MD for:  redness, tenderness, or signs of infection (pain, swelling, redness, odor or green/yellow discharge around incision site)   Complete by: As directed    Call MD for:  severe uncontrolled pain   Complete by: As directed    Call MD for:  temperature >100.4  Complete by: As directed    Diet - low sodium heart healthy   Complete by: As directed    Discharge wound care:   Complete by: As directed    Keep your incision clean and dry. If the steri strips become soiled, you can remove them and reapply   Driving Restrictions    Complete by: As directed    No driving for 2 weeks from surgery if you were cleared to drive before surgery.  Do not take narcotics and drive.   Increase activity slowly   Complete by: As directed    Lifting restrictions   Complete by: As directed    No lifting greater than 10 lbs, pushing, pulling, straining for 6 weeks.     Allergies as of 03/06/2020      Reactions   Bee Venom Anaphylaxis   Amlodipine Swelling   also ineffective   Demerol [meperidine Hcl] Nausea And Vomiting      Lisinopril Cough   Losartan    Bladder pain   Other Rash   Dermabond Surgical glue- rash, whelps      Medication List    STOP taking these medications   cefdinir 300 MG capsule Commonly known as: OMNICEF   sennosides-docusate sodium 8.6-50 MG tablet Commonly known as: SENOKOT-S     TAKE these medications   ALPRAZolam 0.25 MG tablet Commonly known as: XANAX Take 0.25 mg by mouth daily as needed for sleep.   enoxaparin 40 MG/0.4ML injection Commonly known as: LOVENOX Inject 0.4 mLs (40 mg total) into the skin daily for 26 doses. Start taking on: March 07, 2020   estradiol 0.1 MG/GM vaginal cream Commonly known as: ESTRACE VAGINAL Place 1 Applicatorful vaginally 3 (three) times a week. What changed:   when to take this  reasons to take this   ferrous gluconate 324 MG tablet Commonly known as: FERGON Take 1 tablet (324 mg total) by mouth daily with breakfast.   FLUoxetine 20 MG capsule Commonly known as: PROZAC Take 1 capsule (20 mg total) by mouth daily.   lidocaine-prilocaine cream Commonly known as: EMLA Apply to affected area once What changed:   how much to take  how to take this  when to take this  reasons to take this  additional instructions   loratadine 10 MG tablet Commonly known as: CLARITIN Take 10 mg by mouth daily as needed for allergies.   ondansetron 8 MG tablet Commonly known as: ZOFRAN Take 8 mg by mouth every 8 (eight) hours as needed for  nausea or vomiting.   Oxycodone HCl 10 MG Tabs Take 1 tablet (10 mg total) by mouth every 4 (four) hours as needed for severe pain.   polyethylene glycol 17 g packet Commonly known as: MIRALAX / GLYCOLAX Take 17 g by mouth 2 (two) times daily.   prochlorperazine 10 MG tablet Commonly known as: COMPAZINE Take 10 mg by mouth every 6 (six) hours as needed for nausea or vomiting.   simvastatin 40 MG tablet Commonly known as: ZOCOR Take 1 tablet (40 mg total) by mouth at bedtime.   VITAMIN D-3 PO Take 4,000 Units by mouth daily.            Discharge Care Instructions  (From admission, onward)         Start     Ordered   03/06/20 0000  Discharge wound care:       Comments: Keep your incision clean and dry. If the steri strips become soiled, you can  remove them and reapply   03/06/20 1305          Follow-up Information    [name] D, NP Follow up on 03/10/2020.   Specialty: Gynecologic Oncology Why: at 1pm at the [facility] for post-op follow up Contact information: [place] [address] [phone]        Care, [facility] Follow up.   Specialty: Home Health Services Why: to provide home health nursing visits Contact information: [place]  [address] [phone]               Greater than thirty minutes were spend for face to face discharge instructions and discharge orders/summary in EPIC.   Signed: Dorothyann Gibbs 03/06/2020, 3:03 PM

## 2020-03-06 NOTE — TOC Transition Note (Signed)
Transition of Care [facility]) - CM/SW Discharge Note   Patient Details  Name: [name] MRN: [id number] Date of Birth: October 19, 1950  Transition of Care [facility]) CM/SW Contact:  Lennart Pall, LCSW Phone Number: 03/06/2020, 1:20 PM   Clinical Narrative:    Met with pt to discuss dc needs.  Orders placed for [facility].  Pt without agency preference.  Referral placed with [facility].  No further TOC needs.   Final next level of care: [place] Barriers to Discharge: Barriers Resolved   Patient Goals and CMS Choice Patient states their goals for this hospitalization and ongoing recovery are:: return home today      Discharge Placement                       Discharge Plan and Services                DME Arranged: Ostomy supplies DME Agency:  (referred to [facility] via [place] RN)       HH Arranged: RN [place] Agency: [place] Date [place]: 03/06/20 Time [place] Agency Contacted: 72 Representative spoke with at [place]: [place] Interventions     Readmission Risk Interventions No flowsheet data found.

## 2020-03-06 NOTE — Progress Notes (Signed)
2 Days Post-Op Procedure(s) (LRB): EXPLORATORY LAPAROTOMY WITH TRANSVERSE LOOPCOLOSTOMY (N/A) HERNIA REPAIR VENTRAL ADULT (N/A)  Subjective: Patient reports improved pain control. She is tolerating the oxycodone 10 mg. Tolerating diet with no nausea or emesis reported. Reports weakness related to hemoglobin level. Denies shortness of breath, dyspnea. Can feel her heart beat changing related to anemic status as well. Ambulating without difficulty. Assisting with ostomy care. Ostomy nurse came by this am and will be back to assist with bag change when husband is available since it has leaked on to her abdominal dressing. No concerns voiced.  Objective:  Vital signs in last 24 hours: Temp:  [97.8 F (36.6 C)-99 F (37.2 C)] 97.8 F (36.6 C) (10/21 0537) Pulse Rate:  [76-89] 76 (10/21 0537) Resp:  [18] 18 (10/21 0537) BP: (139-143)/(67-90) 139/67 (10/21 0537) SpO2:  [98 %] 98 % (10/21 0537) Last BM Date: 03/06/20  Intake/Output from previous day: 10/20 0701 - 10/21 0700 In: 1440 [P.O.:1440] Out: 3650 [Urine:3100; Stool:550]  Physical Examination: General: alert, cooperative and no distress Resp: clear to auscultation bilaterally Cardio: Extra beats noted intermittently but majority in regular rhythm and rate GI: soft, non-tender; bowel sounds normal; no masses,  no organomegaly, incision: midline incision with op site dressing in place and soiled with ostomy drainage and ostomy appliance intact with minor leaking noted to the dressing, output with flatus in the bag, stoma pink/moist Extremities: extremities normal, atraumatic, no cyanosis or edema   Labs: WBC/Hgb/Hct/Plts:  9.9/8.2/24.2/386 (10/21 0451) BUN/Cr/glu/ALT/AST/amyl/lip:  11/1.01/--/--/--/--/-- (10/21 0451)  Assessment: 69 y.o. s/p Procedure(s): EXPLORATORY LAPAROTOMY WITH TRANSVERSE LOOPCOLOSTOMY HERNIA REPAIR VENTRAL ADULT: stable Pain:  Pain is well-controlled on PRN medications. Tolerating PO.  Heme: Hgb 8.2 from  8.8 and Hct 24.2 from 26.2 this am. Preop Hgb 9.6 Hct 28.5. Mildly symptomatic.  ID: Cefoxitin received during surgery. WBC 9.9 from 13.1 yesterday am-received dexamethasone during surgery.   CV: BP and HR stable. Continue to monitor with ordered vital sign assessments.  GI:  Tolerating po: Yes. Output from ostomy.  GU: Adequate output reported. Creatinine 1.01 this am.  FEN: No critical values this am.  Prophylaxis: Lovenox and SCDs ordered  Plan: HH ordered for ostomy support/teaching Ostomy teaching to continue this am Encourage ambulation, IS use, deep breathing, and coughing Continue plan of care per Dr. Delsa Sale Dr. Denman George Plan for probable discharge later today with follow up on Monday and Tuesday with Dr. Alvy Bimler.   LOS: 4 days     Connie Heath 03/06/2020, 10:37 AM

## 2020-03-06 NOTE — Progress Notes (Signed)
MYRON LONA   DOB:04/05/1951   FO#:[id number]    ASSESSMENT & PLAN:  Ovarian cancer on right [facility]) PLan for outpatient chemo next week  Bowel obstruction secondary to pelvic mass S/p loop colostomy She is managing well  Cancer associated pain Pelvic pain controlled at this time  Anemia, chronic disease Transfuse for hemoglobin less than 8  Hydronephrosis of right kidney She was evaluated by urologist recently I have discussed this case with him and reviewed his documentation Given her preserved renal function, she is recommended for close observation for now I agreed not to proceed with stent placement We will monitor her renal function carefully  Goals of care, counseling/discussion She does not want to be resuscitated -DNR order in place  Discharge planning Hopefully DC next few days, will defer to primary service I will sign off  All questions were answered. The patient knows to call the clinic with any problems, questions or concerns.  [name], MD 03/06/2020 8:16 AM  Subjective:  She feels fine this morning.  Pain is under good control last night and she managed to get some sleep She denies dizziness or lightheadedness with anemia She is managing colostomy well  Objective:  Vitals:   03/05/20 2114 03/06/20 0537  BP: (!) 143/90 139/67  Pulse: 89 76  Resp: 18 18  Temp: 98.4 F (36.9 C) 97.8 F (36.6 C)  SpO2: 98% 98%     Intake/Output Summary (Last 24 hours) at 03/06/2020 0816 Last data filed at 03/06/2020 0800 Gross per 24 hour  Intake 1440 ml  Output 3900 ml  Net -2460 ml    GENERAL:alert, no distress and comfortable.  She looks pale Musculoskeletal:no cyanosis of digits and no clubbing  NEURO: alert & oriented x 3 with fluent speech, no focal motor/sensory deficits   Labs:  Recent Labs    10/10/19 0927 10/10/19 0927 01/02/20 1310 01/02/20 1310 02/18/20 1306 02/18/20 1306 02/26/20 1406 02/26/20 1406 03/02/20 1015  03/03/20 0638 03/04/20 0538 03/05/20 0550 03/06/20 0451  NA 137   < > 133*   < > 133*   < > 132*   < > 131*  --  135 129* 132*  K 4.2   < > 4.2   < > 4.2   < > 4.5   < > 3.7  --  4.3 4.3 3.8  CL 102   < > 99   < > 99   < > 95*   < > 93*  --  97* 96* 99  CO2 25   < > 24   < > 28   < > 30   < > 23  --  26 26 25   GLUCOSE 96   < > 84   < > 88   < > 99   < > 107*  --  91 148* 100*  BUN 15   < > 18   < > 9   < > 8   < > 8  --  6* 8 11  CREATININE 0.95   < > 1.12*   < > 0.83   < > 0.96  --  0.82   < > 0.94 0.93 1.01*  CALCIUM 9.9   < > 10.3   < > 10.1   < > 10.4*   < > 9.5  --  9.5 8.5* 8.2*  GFRNONAA >60   < > 51*   < > >60   < > >60  --  >60   < > >  60 >60 57*  GFRAA >60  --  59*  --  >60  --   --   --   --   --   --   --   --   PROT 7.6   < > 7.4   < > 7.4  --  8.2*  --  7.7  --   --   --   --   ALBUMIN 3.9   < > 3.9   < > 3.3*  --  3.6  --  3.8  --   --   --   --   AST 19   < > 28   < > 28  --  20  --  23  --   --   --   --   ALT 20   < > 37   < > 31  --  20  --  15  --   --   --   --   ALKPHOS 76   < > 75   < > 94  --  106  --  71  --   --   --   --   BILITOT 0.4   < > 0.3   < > 0.5  --  0.3  --  0.6  --   --   --   --    < > = values in this interval not displayed.

## 2020-03-06 NOTE — Consult Note (Addendum)
[place] Nurse ostomy consult note Stoma type/location: LUQ colostomy  Has midline honeycomb dressing that will be removed with pouch change today due to leakage of pouch.  Awaiting arrival of husband as he will be assisting at home.  Anticipate discharge with [facility] RN for ongoing teaching. 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  Stomal assessment/size: pink and moist Peristomal assessment: midline incision.  POuch appears to have leaked.  Treatment options for stomal/peristomal skin: will implement 2 piece pouch and barrier ring.  Output soft brown stool in pouch Ostomy pouching:2pc. Pouch with barrier ring.   Education provided: will perform pouch change when spouse arrives this AM.   Enrolled patient in [place] program: No Will follow.  Domenic Moras MSN, RN, FNP-BC CWON Wound, Ostomy, Continence Nurse Pager [phone]

## 2020-03-06 NOTE — Consult Note (Signed)
[place] Nurse ostomy follow up Stoma type/location: LUQ colostomy  Spouse present and ready to learn ostomy care.  Patient has already been emptying stool when 1/3 full.  POuch and honeycoomb dressing removed.  Steri strips in place.   Stomal assessment/size: 1 3/4" (slightly larger, opening cut as large as possible)  Well budded and pink, moist stoma Peristomal assessment: intact  Midline wound with steri strips.  Treatment options for stomal/peristomal skin: barrier ring Output soft brown stool Ostomy pouching: 2pc. 2 1/4" pouch with barrier ring.    Education provided: Spouse and patient understand twice weekly pouch changes.  Understand barrier ring and rationale.  Able to open and roll pouch closed. Left card for SS to enroll with [facility].  Discussed showering and emptying as well. 4 pouch sets left in room above sink.   Enrolled patient in [place] Start Discharge program: Yes will today. Card in room.  Will follow.   Domenic Moras MSN, RN, FNP-BC CWON Wound, Ostomy, Continence Nurse Pager [phone]

## 2020-03-07 ENCOUNTER — Telehealth: Payer: Self-pay

## 2020-03-07 NOTE — Telephone Encounter (Signed)
Connie Heath states that she is eating,drinking and urinating well. Afebrile. Ostomy is working well. It is not leaking. The surgical incision is not draining. It looks clean. Her pain is controlled with current medications. She will bring ostomy supplies to Monday's appointment incase [name],NP needs to lift up the ostomy bag to see incision. Pt will call [name] shortly as she has not heard from them. She knows to call the office [phone] after hours if she has any questions or concerns.

## 2020-03-08 ENCOUNTER — Encounter (HOSPITAL_COMMUNITY): Payer: Self-pay | Admitting: Emergency Medicine

## 2020-03-08 ENCOUNTER — Other Ambulatory Visit (HOSPITAL_COMMUNITY): Payer: Self-pay | Admitting: Emergency Medicine

## 2020-03-08 ENCOUNTER — Emergency Department (HOSPITAL_COMMUNITY): Payer: Medicare HMO

## 2020-03-08 ENCOUNTER — Emergency Department (HOSPITAL_COMMUNITY)
Admission: EM | Admit: 2020-03-08 | Discharge: 2020-03-08 | Disposition: A | Discharge status: Home / Self Care | Payer: Medicare HMO | Attending: Emergency Medicine | Admitting: Emergency Medicine

## 2020-03-08 ENCOUNTER — Other Ambulatory Visit: Payer: Self-pay

## 2020-03-08 DIAGNOSIS — K219 Gastro-esophageal reflux disease without esophagitis: Secondary | ICD-10-CM | POA: Diagnosis not present

## 2020-03-08 DIAGNOSIS — Z96641 Presence of right artificial hip joint: Secondary | ICD-10-CM | POA: Insufficient documentation

## 2020-03-08 DIAGNOSIS — R1909 Other intra-abdominal and pelvic swelling, mass and lump: Secondary | ICD-10-CM | POA: Diagnosis not present

## 2020-03-08 DIAGNOSIS — Z79899 Other long term (current) drug therapy: Secondary | ICD-10-CM | POA: Insufficient documentation

## 2020-03-08 DIAGNOSIS — Z8543 Personal history of malignant neoplasm of ovary: Secondary | ICD-10-CM | POA: Insufficient documentation

## 2020-03-08 DIAGNOSIS — R109 Unspecified abdominal pain: Secondary | ICD-10-CM | POA: Diagnosis not present

## 2020-03-08 DIAGNOSIS — G8918 Other acute postprocedural pain: Secondary | ICD-10-CM | POA: Insufficient documentation

## 2020-03-08 DIAGNOSIS — Z9889 Other specified postprocedural states: Secondary | ICD-10-CM | POA: Diagnosis not present

## 2020-03-08 DIAGNOSIS — N135 Crossing vessel and stricture of ureter without hydronephrosis: Secondary | ICD-10-CM | POA: Diagnosis not present

## 2020-03-08 DIAGNOSIS — I10 Essential (primary) hypertension: Secondary | ICD-10-CM | POA: Diagnosis not present

## 2020-03-08 DIAGNOSIS — J45909 Unspecified asthma, uncomplicated: Secondary | ICD-10-CM | POA: Insufficient documentation

## 2020-03-08 DIAGNOSIS — C569 Malignant neoplasm of unspecified ovary: Secondary | ICD-10-CM | POA: Diagnosis not present

## 2020-03-08 DIAGNOSIS — R110 Nausea: Secondary | ICD-10-CM | POA: Diagnosis not present

## 2020-03-08 HISTORY — DX: Unspecified asthma, uncomplicated: J45.909

## 2020-03-08 LAB — URINALYSIS, ROUTINE W REFLEX MICROSCOPIC
Bacteria, UA: NONE SEEN
Bilirubin Urine: NEGATIVE
Glucose, UA: NEGATIVE mg/dL
Hgb urine dipstick: NEGATIVE
Ketones, ur: NEGATIVE mg/dL
Leukocytes,Ua: NEGATIVE
Nitrite: NEGATIVE
Protein, ur: NEGATIVE mg/dL
Specific Gravity, Urine: 1.008 (ref 1.005–1.030)
pH: 5 (ref 5.0–8.0)

## 2020-03-08 LAB — LIPASE, BLOOD: Lipase: 16 U/L (ref 11–51)

## 2020-03-08 LAB — COMPREHENSIVE METABOLIC PANEL
ALT: 18 U/L (ref 0–44)
AST: 28 U/L (ref 15–41)
Albumin: 3.3 g/dL — ABNORMAL LOW (ref 3.5–5.0)
Alkaline Phosphatase: 68 U/L (ref 38–126)
Anion gap: 10 (ref 5–15)
BUN: 9 mg/dL (ref 8–23)
CO2: 23 mmol/L (ref 22–32)
Calcium: 9 mg/dL (ref 8.9–10.3)
Chloride: 99 mmol/L (ref 98–111)
Creatinine, Ser: 1.08 mg/dL — ABNORMAL HIGH (ref 0.44–1.00)
GFR, Estimated: 56 mL/min — ABNORMAL LOW (ref >60)
Glucose, Bld: 100 mg/dL — ABNORMAL HIGH (ref 70–99)
Potassium: 3.8 mmol/L (ref 3.5–5.1)
Sodium: 132 mmol/L — ABNORMAL LOW (ref 135–145)
Total Bilirubin: 0.6 mg/dL (ref 0.3–1.2)
Total Protein: 7 g/dL (ref 6.5–8.1)

## 2020-03-08 LAB — CBC
HCT: 27.8 % — ABNORMAL LOW (ref 36.0–46.0)
Hemoglobin: 9.3 g/dL — ABNORMAL LOW (ref 12.0–15.0)
MCH: 31 pg (ref 26.0–34.0)
MCHC: 33.5 g/dL (ref 30.0–36.0)
MCV: 92.7 fL (ref 80.0–100.0)
Platelets: 468 10*3/uL — ABNORMAL HIGH (ref 150–400)
RBC: 3 MIL/uL — ABNORMAL LOW (ref 3.87–5.11)
RDW: 14.2 % (ref 11.5–15.5)
WBC: 10.8 10*3/uL — ABNORMAL HIGH (ref 4.0–10.5)
nRBC: 0 % (ref 0.0–0.2)

## 2020-03-08 MED ORDER — OXYCODONE HCL 5 MG PO TABS: 5.0000 mg | ORAL_TABLET | ORAL | 0 refills | Status: DC | PRN

## 2020-03-08 MED ORDER — SODIUM CHLORIDE (PF) 0.9 % IJ SOLN
INTRAMUSCULAR | Status: AC
  Filled 2020-03-08: qty 50

## 2020-03-08 MED ORDER — PROMETHAZINE HCL 25 MG PO TABS: 25.0000 mg | ORAL_TABLET | Freq: Four times a day (QID) | ORAL | 0 refills | Status: DC | PRN

## 2020-03-08 MED ORDER — HYDROMORPHONE HCL 1 MG/ML IJ SOLN
1.0000 mg | Freq: Once | INTRAMUSCULAR | Status: AC
  Administered 2020-03-08: 1 mg via INTRAVENOUS
  Filled 2020-03-08: qty 1

## 2020-03-08 MED ORDER — ONDANSETRON HCL 4 MG/2ML IJ SOLN
4.0000 mg | Freq: Once | INTRAMUSCULAR | Status: AC
  Administered 2020-03-08: 4 mg via INTRAVENOUS
  Filled 2020-03-08: qty 2

## 2020-03-08 MED ORDER — OXYCODONE HCL 5 MG PO TABS
5.0000 mg | ORAL_TABLET | ORAL | Status: DC | PRN
  Administered 2020-03-08: 5 mg via ORAL
  Filled 2020-03-08: qty 1

## 2020-03-08 MED ORDER — IOHEXOL 300 MG/ML  SOLN
100.0000 mL | Freq: Once | INTRAMUSCULAR | Status: AC | PRN
  Administered 2020-03-08: 100 mL via INTRAVENOUS

## 2020-03-08 MED ORDER — SODIUM CHLORIDE 0.9 % IV BOLUS
500.0000 mL | Freq: Once | INTRAVENOUS | Status: AC
  Administered 2020-03-08: 500 mL via INTRAVENOUS

## 2020-03-08 MED FILL — PROMETHAZINE 25 MG TABLET: 25 | 10 days supply | Qty: 30 | Fill #0

## 2020-03-08 MED FILL — oxyCODONE HCL 5 MG TABS: 5 | 3 days supply | Qty: 12 | Fill #0

## 2020-03-08 NOTE — ED Notes (Signed)
Patient resting in Triage 2/ Reclined chair/ warm blanket/ pillow/ heat packs for back and abdomen

## 2020-03-08 NOTE — ED Notes (Signed)
Patient tolerating soup.

## 2020-03-08 NOTE — ED Notes (Signed)
ED Provider at bedside. 

## 2020-03-08 NOTE — ED Triage Notes (Signed)
Patient c/o abdominal pain, nausea, and decreased output from colostomy since 0900 this morning. D/c after surgery on 10/22.

## 2020-03-08 NOTE — ED Provider Notes (Signed)
[facility] DEPT Provider Note   CSN: [id number] Arrival date & time: 03/08/20  1319     History Chief Complaint  Patient presents with  . Post-op Problem  . Abdominal Pain    Connie Heath is a 69 y.o. female.  69 year old female with history of recurrent ovarian cancer, recently admitted to the hospital with bowel obstruction resulting in colostomy and repair of incisional hernia, discharged home on 03/06/20 on post op day 2 (managed by Dr. Denman George), presents with complaint of worsening abdominal pain with nausea and decreased output from her colostomy. Patient states she has had phenomenal output from her colostomy until today. Patient woke up at 2AM to empty her bag, patient emptied her bag at 9AM in preparation for a visit from the ostomy nurse this morning. Patient reports increase in her baseline tolerable pain (managed with oxycodone and tylenol BID, zofran and compazine PRN) this morning, decrease in stool output/no output and no flatus. Patient called Dr. Denman George who was concerned for ileus and advised patient to come to the ER. Patient has had decreased PO intake today due to not feeling well, denies fevers or changes in urinary output.         Past Medical History:  Diagnosis Date  . #[id number] dx'd 03/2019   ovarian cancer   . Asthma   . Depression   . Dyspnea    with increased exertion   . Family history of lung cancer   . Family history of stomach cancer   . Fatigue   . GERD (gastroesophageal reflux disease)   . Hyperlipidemia    H/o myalgias with Vytorin but tolerating simvastatin  . Hypertension   . Myalgia    Hx of   . S/P abdominal paracentesis 04/25/2019   pulled off 2.5 liters  . S/P cholecystectomy   . S/P hysterectomy   . Shingles     Patient Active Problem List   Diagnosis Date Noted  . Ovarian cancer ([place]) 03/04/2020  . Cancer related pain 03/02/2020  . Bowel obstruction ([place]) 03/02/2020  . Encounter for  antineoplastic chemotherapy 02/26/2020  . Goals of care, counseling/discussion 02/26/2020  . Deficiency anemia 02/26/2020  . 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 Preventive measure 02/26/2020  . Hydronephrosis of right kidney 02/22/2020  . Dysuria 02/19/2020  . Constipation 02/19/2020  . Anemia, chronic disease 02/19/2020  . UTI (urinary tract infection) 02/19/2020  . Abdominal pain 02/18/2020  . Anemia due to antineoplastic chemotherapy 10/11/2019  . Atrophic vaginitis 08/17/2019  . Genetic testing 08/14/2019  . Peripheral neuropathy due to and not concurrent with chemotherapy ([place]) 05/21/2019  . Family history of stomach cancer   . Family history of lung cancer   . GERD (gastroesophageal reflux disease) 05/01/2019  . Reactive thrombocytosis 04/20/2019  . Leukocytosis 04/20/2019  . Cancer associated pain 04/20/2019  . Ovarian cancer on right ([place]) 04/18/2019  . Hyperparathyroidism ([place]) 10/22/2014  . Extrinsic asthma 09/26/2014  . Fibromyalgia 09/26/2014  . Poor concentration 08/08/2014  . Family history of colon cancer 08/08/2014  . Insomnia 08/08/2014  . Prediabetes 08/08/2014  . Essential hypertension 07/19/2014  . Hyperlipidemia 07/19/2014  . Hypercalcemia 07/19/2014  . Depression 07/19/2014  . ABNORMAL EKG 12/04/2008    Past Surgical History:  Procedure Laterality Date  . DIAGNOSTIC LAPAROSCOPY    . IR IMAGING GUIDED PORT INSERTION  05/17/2019  . JOINT REPLACEMENT     right total hip   . LAPAROSCOPIC CHOLECYSTECTOMY    .  LAPAROTOMY N/A 03/04/2020   Procedure: EXPLORATORY LAPAROTOMY WITH TRANSVERSE LOOPCOLOSTOMY;  Surgeon: Everitt Amber, MD;  Location: [facility]S;  Service: Gynecology;  Laterality: N/A;  . PARACENTESIS    . PARATHYROIDECTOMY Right 2013  . PARTIAL HYSTERECTOMY    . TONSILLECTOMY AND ADENOIDECTOMY    . VENTRAL HERNIA REPAIR N/A 03/04/2020   Procedure: HERNIA REPAIR VENTRAL ADULT;  Surgeon: Everitt Amber, MD;  Location: [facility];  Service:  Gynecology;  Laterality: N/A;     OB History   No obstetric history on file.     Family History  Problem Relation Age of Onset  . Lung cancer Mother   . Cancer Father 6       Stomach  . Heart failure Maternal Grandmother 80       CHF  . Heart failure Maternal Grandfather 50       Acute MI  . Cancer Paternal Aunt        either colon or liver    Social History   Tobacco Use  . Smoking status: Never Smoker  . Smokeless tobacco: Never Used  Vaping Use  . Vaping Use: Never used  Substance Use Topics  . Alcohol use: Yes    Comment: Occasional  . Drug use: No    Home Medications Prior to Admission medications   Medication Sig Start Date End Date Taking? Authorizing Provider  ALPRAZolam Duanne Moron) 0.25 MG tablet Take 0.25 mg by mouth daily as needed for sleep. 11/09/19   [provider]  Cholecalciferol (VITAMIN D-3 PO) Take 4,000 Units by mouth daily.     [provider]  enoxaparin (LOVENOX) 40 MG/0.4ML injection Inject 0.4 mLs (40 mg total) into the skin daily for 26 doses. 03/07/20 04/02/20  [name] D, NP  estradiol (ESTRACE VAGINAL) 0.1 MG/GM vaginal cream Place 1 Applicatorful vaginally 3 (three) times a week. Patient taking differently: Place 1 Applicatorful vaginally as needed.  08/17/19   [name], MD  ferrous gluconate (FERGON) 324 MG tablet Take 1 tablet (324 mg total) by mouth daily with breakfast. 03/06/20   Cross, Lenna Sciara D, NP  FLUoxetine (PROZAC) 20 MG capsule Take 1 capsule (20 mg total) by mouth daily. 12/16/15   Breeback, Luvenia Starch L, PA-C  lidocaine-prilocaine (EMLA) cream Apply to affected area once Patient taking differently: Apply 1 application topically daily as needed (port access).  04/20/19   [name], MD  loratadine (CLARITIN) 10 MG tablet Take 10 mg by mouth daily as needed for allergies.    [provider]  ondansetron (ZOFRAN) 8 MG tablet Take 8 mg by mouth every 8 (eight) hours as needed for nausea or vomiting.      [provider]  oxyCODONE (OXY IR/ROXICODONE) 5 MG immediate release tablet Take 1 tablet (5 mg total) by mouth every 4 (four) hours as needed for severe pain or breakthrough pain. 03/08/20   Tacy Learn, PA-C  oxyCODONE 10 MG TABS Take 1 tablet (10 mg total) by mouth every 4 (four) hours as needed for severe pain. 02/26/20   [name], MD  polyethylene glycol (MIRALAX / GLYCOLAX) 17 g packet Take 17 g by mouth 2 (two) times daily.    [provider]  prochlorperazine (COMPAZINE) 10 MG tablet Take 10 mg by mouth every 6 (six) hours as needed for nausea or vomiting.     [provider]  promethazine (PHENERGAN) 25 MG tablet Take 1 tablet (25 mg total) by mouth every 6 (six) hours as needed for nausea or  vomiting. 03/08/20   Tacy Learn, PA-C  simvastatin (ZOCOR) 40 MG tablet Take 1 tablet (40 mg total) by mouth at bedtime. 12/17/15   Breeback, Luvenia Starch L, PA-C    Allergies    Bee venom, Amlodipine, Demerol [meperidine hcl], Lisinopril, Losartan, and Other  Review of Systems   Review of Systems  Constitutional: Positive for appetite change. Negative for chills, diaphoresis and fever.  Respiratory: Negative for shortness of breath.   Cardiovascular: Negative for chest pain.  Gastrointestinal: Positive for abdominal pain and nausea. Negative for blood in stool and vomiting.  Genitourinary: Negative for dysuria and frequency.  Musculoskeletal: Negative for arthralgias and myalgias.  Skin: Negative for rash and wound.  Neurological: Negative for dizziness and weakness.  All other systems reviewed and are negative.   Physical Exam Updated Vital Signs BP 131/64   Pulse 91   Temp 97.7 F (36.5 C) (Oral)   Resp 18   SpO2 96%   Physical Exam Vitals and nursing note reviewed.  Constitutional:      General: She is not in acute distress.    Appearance: She is well-developed. She is not diaphoretic.  HENT:     Head: Normocephalic and atraumatic.    Cardiovascular:     Rate and Rhythm: Normal rate and regular rhythm.     Heart sounds: Normal heart sounds. No murmur heard.   Pulmonary:     Effort: Pulmonary effort is normal.     Breath sounds: Normal breath sounds.  Abdominal:     General: A surgical scar is present. Bowel sounds are decreased.     Palpations: Abdomen is soft.     Tenderness: There is generalized abdominal tenderness.     Comments: midline colostomy, minimal output of soft brown stool  Skin:    General: Skin is warm and dry.     Findings: No erythema or rash.  Neurological:     Mental Status: She is alert and oriented to person, place, and time.  Psychiatric:        Behavior: Behavior normal.     ED Results / Procedures / Treatments   Labs (all labs ordered are listed, but only abnormal results are displayed) Labs Reviewed  COMPREHENSIVE METABOLIC PANEL - Abnormal; Notable for the following components:      Result Value   Sodium 132 (*)    Glucose, Bld 100 (*)    Creatinine, Ser 1.08 (*)    Albumin 3.3 (*)    GFR, Estimated 56 (*)    All other components within normal limits  CBC - Abnormal; Notable for the following components:   WBC 10.8 (*)    RBC 3.00 (*)    Hemoglobin 9.3 (*)    HCT 27.8 (*)    Platelets 468 (*)    All other components within normal limits  LIPASE, BLOOD  URINALYSIS, ROUTINE W REFLEX MICROSCOPIC    EKG None  Radiology CT Abdomen Pelvis W Contrast  Result Date: 03/08/2020 CLINICAL DATA:  Recurrent ovarian carcinoma. Recent exploratory laparotomy with transverse loop colostomy. EXAM: CT ABDOMEN AND PELVIS WITH CONTRAST TECHNIQUE: Multidetector CT imaging of the abdomen and pelvis was performed using the standard protocol following bolus administration of intravenous contrast. CONTRAST:  151mL OMNIPAQUE IOHEXOL 300 MG/ML  SOLN COMPARISON:  CT scan 03/02/2020 FINDINGS: Lower chest: Trace pericardial effusions. Mild RIGHT basilar atelectasis. No pneumonia. Hepatobiliary: No  focal hepatic lesion. Postcholecystectomy. No biliary dilatation. Pancreas: Pancreas is normal. No ductal dilatation. No pancreatic inflammation. Spleen: Normal spleen  Adrenals/urinary tract: Adrenal glands normal. Persistent RIGHT hydronephrosis and hydroureter. Hydroureter to the level of the pelvic mass. Findings similar to CT 03/02/2020. Extrarenal pelvis on the LEFT. No ureteral dilatation. Bladder is compressed anteriorly by pelvic mass. Small a gas within the bladder related to recent surgery/catheterization. Stomach/Bowel: No gastric distension. The stomach and duodenum normal. Small bowel normal. Terminal ileum and cecum normal. Appendix not identified. Ascending colon normal. Transverse colon exits a LEFT midline ostomy without complication. Vascular/Lymphatic: Abdominal aorta is normal caliber. No periportal or retroperitoneal adenopathy. No pelvic adenopathy. Reproductive: Large multilobulated pelvic mass again demonstrated. Example lobular component measures 8.6 x 8.2 cm (image 74/2) compared to 7.0 by 7.5 cm. More anterior lobular component measures 5.7 cm by 4.9 cm compared to 5.5 x 4.2 cm. In craniocaudad dimension cul-de-sac mass measures 11.8 cm compared to 9.6 cm. Other: Postsurgical change in the abdominal wall consistent laparoscopic surgery. No complicating features. Musculoskeletal: No aggressive osseous lesion. IMPRESSION: 1. No evidence of bowel obstruction.  Stomach small bowel normal. 2. Post laparoscopic transverse loop colostomy without complicating features. 3. Persistent moderate RIGHT ureteral obstruction. 4. Pelvic mass measures larger in short interval follow-up from 03/02/2020. Electronically Signed   By: Suzy Bouchard M.D.   On: 03/08/2020 15:20    Procedures Procedures (including critical care time)  Medications Ordered in ED Medications  sodium chloride (PF) 0.9 % injection (has no administration in time range)  oxyCODONE (Oxy IR/ROXICODONE) immediate release tablet  5 mg (5 mg Oral Given 03/08/20 1640)  sodium chloride 0.9 % bolus 500 mL (0 mLs Intravenous Stopped 03/08/20 1528)  HYDROmorphone (DILAUDID) injection 1 mg (1 mg Intravenous Given 03/08/20 1416)  ondansetron (ZOFRAN) injection 4 mg (4 mg Intravenous Given 03/08/20 1416)  iohexol (OMNIPAQUE) 300 MG/ML solution 100 mL (100 mLs Intravenous Contrast Given 03/08/20 1422)    ED Course  I have reviewed the triage vital signs and the nursing notes.  Pertinent labs & imaging results that were available during my care of the patient were reviewed by me and considered in my medical decision making (see chart for details).  Clinical Course as of Mar 08 1912  Sat Mar 08, 7642  7542 69 year old female with history as above with complaint of abdominal pain and decreased output from colostomy with concern for ileus.  On exam, found to have decreased bowel sounds on exam with generalized tenderness and minimal output of soft brown stool.  CT negative for obstruction or ileus, labs without significant change from prior. Hgb improved from prior, no leukocytosis. Discussed case with Dr. Denman George, patient's oncologist and surgeon, plan is to PO challenge, adjust pain regimen, if passes PO challenge, may be dc to follow up in clinic next week as planned. Patient is able to tolerate soup and water. Plan is to dc with Oxy IR and phenergan. Return to ER as needed, otherwise follow up as planned.    [LM]    Clinical Course User Index [LM] Roque Lias   MDM Rules/Calculators/A&P                          Final Clinical Impression(s) / ED Diagnoses Final diagnoses:  Post-op pain    Rx / DC Orders ED Discharge Orders         Ordered    oxyCODONE (OXY IR/ROXICODONE) 5 MG immediate release tablet  Every 4 hours PRN        03/08/20 1543    promethazine (  PHENERGAN) 25 MG tablet  Every 6 hours PRN        03/08/20 1543           Tacy Learn, PA-C 03/08/20 1913    Little, Wenda Overland,  MD 03/09/20 [id number]

## 2020-03-08 NOTE — Discharge Instructions (Addendum)
Return to ER as needed for any worsening or concerning symptoms. Take medications as prescribed as needed. Follow-up with your care team as planned.

## 2020-03-10 ENCOUNTER — Inpatient Hospital Stay (HOSPITAL_BASED_OUTPATIENT_CLINIC_OR_DEPARTMENT_OTHER): Payer: Medicare HMO | Admitting: Gynecologic Oncology

## 2020-03-10 ENCOUNTER — Other Ambulatory Visit: Payer: Self-pay | Admitting: Hematology and Oncology

## 2020-03-10 ENCOUNTER — Encounter: Payer: Self-pay | Admitting: Gynecologic Oncology

## 2020-03-10 ENCOUNTER — Telehealth: Payer: Self-pay

## 2020-03-10 ENCOUNTER — Other Ambulatory Visit: Payer: Self-pay

## 2020-03-10 VITALS — BP 175/75 | HR 95 | Temp 97.1°F | Resp 18 | Wt 164.5 lb

## 2020-03-10 DIAGNOSIS — C495 Malignant neoplasm of connective and soft tissue of pelvis: Secondary | ICD-10-CM

## 2020-03-10 DIAGNOSIS — C561 Malignant neoplasm of right ovary: Secondary | ICD-10-CM

## 2020-03-10 DIAGNOSIS — G893 Neoplasm related pain (acute) (chronic): Secondary | ICD-10-CM

## 2020-03-10 MED ORDER — MORPHINE SULFATE ER 30 MG PO TBCR
30.0000 mg | EXTENDED_RELEASE_TABLET | Freq: Three times a day (TID) | ORAL | 0 refills | Status: DC
Start: 2020-03-10 — End: 2020-03-10

## 2020-03-10 MED ORDER — MORPHINE SULFATE 30 MG PO TABS
30.0000 mg | ORAL_TABLET | ORAL | 0 refills | Status: DC | PRN
Start: 2020-03-10 — End: 2020-03-10

## 2020-03-10 MED FILL — MORPHINE SULF ER 30 MG TAB: 30 | 30 days supply | Qty: 90 | Fill #0

## 2020-03-10 MED FILL — MORPHINE SULFATE IR 30 MG T: 30 | 10 days supply | Qty: 60 | Fill #0

## 2020-03-10 NOTE — Patient Instructions (Signed)
Dr. Alvy Bimler has adjusted your pain medication. Plan to follow up with Dr. Alvy Bimler tomorrow. Try to increase your protein intake with adding protein powder to your yogurt or mashed potatoes or consider drinking Ensure or protein shakes. Continue to monitor your incision and call for any changes such as redness, drainage, wound separation. Dr. Alvy Bimler does not feel that a doppler is needed for the right pedal edema at this time but please monitor for any changes.

## 2020-03-10 NOTE — Telephone Encounter (Signed)
Told Connie Heath in intake that she will tell the schedulers to cancel follow up appointments with PCP for now.  Connie Heath states that her medical oncologist Dr. Alvy Bimler will handle her care for now. Please cancel appointments per patient's request.

## 2020-03-10 NOTE — Telephone Encounter (Signed)
[name], NP says Ms Prazak. keeps getting calls from her PCP about getting in as soon as possible to be seen. She is seeing [place] tomorrow and it covered and does not feel an appt with her PCP is needed at this time. She wants [place] to call and tell them to stop pushing for an appt and let them know she is taken care of with Alvy Bimler

## 2020-03-11 ENCOUNTER — Telehealth: Payer: Self-pay | Admitting: Hematology

## 2020-03-11 ENCOUNTER — Inpatient Hospital Stay: Payer: Medicare HMO

## 2020-03-11 ENCOUNTER — Encounter: Payer: Self-pay | Admitting: Oncology

## 2020-03-11 ENCOUNTER — Encounter (HOSPITAL_COMMUNITY): Payer: Self-pay | Admitting: Hematology and Oncology

## 2020-03-11 ENCOUNTER — Encounter: Payer: Self-pay | Admitting: Gynecologic Oncology

## 2020-03-11 ENCOUNTER — Telehealth: Payer: Self-pay | Admitting: Nurse Practitioner

## 2020-03-11 ENCOUNTER — Other Ambulatory Visit: Payer: Self-pay

## 2020-03-11 ENCOUNTER — Inpatient Hospital Stay (HOSPITAL_BASED_OUTPATIENT_CLINIC_OR_DEPARTMENT_OTHER): Payer: Medicare HMO | Admitting: Hematology and Oncology

## 2020-03-11 ENCOUNTER — Encounter: Payer: Self-pay | Admitting: Hematology and Oncology

## 2020-03-11 ENCOUNTER — Telehealth: Payer: Self-pay | Admitting: Hematology and Oncology

## 2020-03-11 DIAGNOSIS — N1330 Unspecified hydronephrosis: Secondary | ICD-10-CM

## 2020-03-11 DIAGNOSIS — N390 Urinary tract infection, site not specified: Secondary | ICD-10-CM | POA: Diagnosis not present

## 2020-03-11 DIAGNOSIS — R600 Localized edema: Secondary | ICD-10-CM | POA: Diagnosis not present

## 2020-03-11 DIAGNOSIS — K5909 Other constipation: Secondary | ICD-10-CM | POA: Diagnosis not present

## 2020-03-11 DIAGNOSIS — K5730 Diverticulosis of large intestine without perforation or abscess without bleeding: Secondary | ICD-10-CM | POA: Diagnosis not present

## 2020-03-11 DIAGNOSIS — K432 Incisional hernia without obstruction or gangrene: Secondary | ICD-10-CM | POA: Diagnosis not present

## 2020-03-11 DIAGNOSIS — M8580 Other specified disorders of bone density and structure, unspecified site: Secondary | ICD-10-CM | POA: Diagnosis not present

## 2020-03-11 DIAGNOSIS — R1032 Left lower quadrant pain: Secondary | ICD-10-CM | POA: Diagnosis not present

## 2020-03-11 DIAGNOSIS — Z66 Do not resuscitate: Secondary | ICD-10-CM | POA: Diagnosis not present

## 2020-03-11 DIAGNOSIS — G893 Neoplasm related pain (acute) (chronic): Secondary | ICD-10-CM | POA: Diagnosis not present

## 2020-03-11 DIAGNOSIS — C495 Malignant neoplasm of connective and soft tissue of pelvis: Secondary | ICD-10-CM

## 2020-03-11 DIAGNOSIS — Z79899 Other long term (current) drug therapy: Secondary | ICD-10-CM | POA: Diagnosis not present

## 2020-03-11 DIAGNOSIS — Z7901 Long term (current) use of anticoagulants: Secondary | ICD-10-CM | POA: Diagnosis not present

## 2020-03-11 DIAGNOSIS — Z9221 Personal history of antineoplastic chemotherapy: Secondary | ICD-10-CM | POA: Diagnosis not present

## 2020-03-11 DIAGNOSIS — T451X5A Adverse effect of antineoplastic and immunosuppressive drugs, initial encounter: Secondary | ICD-10-CM

## 2020-03-11 DIAGNOSIS — C561 Malignant neoplasm of right ovary: Secondary | ICD-10-CM

## 2020-03-11 DIAGNOSIS — C786 Secondary malignant neoplasm of retroperitoneum and peritoneum: Secondary | ICD-10-CM | POA: Diagnosis not present

## 2020-03-11 DIAGNOSIS — Z5111 Encounter for antineoplastic chemotherapy: Secondary | ICD-10-CM | POA: Diagnosis not present

## 2020-03-11 DIAGNOSIS — D539 Nutritional anemia, unspecified: Secondary | ICD-10-CM

## 2020-03-11 DIAGNOSIS — I700 Atherosclerosis of aorta: Secondary | ICD-10-CM | POA: Diagnosis not present

## 2020-03-11 DIAGNOSIS — J90 Pleural effusion, not elsewhere classified: Secondary | ICD-10-CM | POA: Diagnosis not present

## 2020-03-11 DIAGNOSIS — R188 Other ascites: Secondary | ICD-10-CM | POA: Diagnosis not present

## 2020-03-11 DIAGNOSIS — K449 Diaphragmatic hernia without obstruction or gangrene: Secondary | ICD-10-CM | POA: Diagnosis not present

## 2020-03-11 DIAGNOSIS — R109 Unspecified abdominal pain: Secondary | ICD-10-CM | POA: Diagnosis not present

## 2020-03-11 DIAGNOSIS — K56609 Unspecified intestinal obstruction, unspecified as to partial versus complete obstruction: Secondary | ICD-10-CM | POA: Diagnosis not present

## 2020-03-11 DIAGNOSIS — Z23 Encounter for immunization: Secondary | ICD-10-CM | POA: Diagnosis not present

## 2020-03-11 DIAGNOSIS — D6481 Anemia due to antineoplastic chemotherapy: Secondary | ICD-10-CM

## 2020-03-11 LAB — CBC WITH DIFFERENTIAL (CANCER CENTER ONLY)
Abs Immature Granulocytes: 0.06 10*3/uL (ref 0.00–0.07)
Basophils Absolute: 0.1 10*3/uL (ref 0.0–0.1)
Basophils Relative: 1 %
Eosinophils Absolute: 0.7 10*3/uL — ABNORMAL HIGH (ref 0.0–0.5)
Eosinophils Relative: 6 %
HCT: 24.3 % — ABNORMAL LOW (ref 36.0–46.0)
Hemoglobin: 8.2 g/dL — ABNORMAL LOW (ref 12.0–15.0)
Immature Granulocytes: 1 %
Lymphocytes Relative: 11 %
Lymphs Abs: 1.2 10*3/uL (ref 0.7–4.0)
MCH: 30.5 pg (ref 26.0–34.0)
MCHC: 33.7 g/dL (ref 30.0–36.0)
MCV: 90.3 fL (ref 80.0–100.0)
Monocytes Absolute: 1.5 10*3/uL — ABNORMAL HIGH (ref 0.1–1.0)
Monocytes Relative: 14 %
Neutro Abs: 7.8 10*3/uL — ABNORMAL HIGH (ref 1.7–7.7)
Neutrophils Relative %: 67 %
Platelet Count: 399 10*3/uL (ref 150–400)
RBC: 2.69 MIL/uL — ABNORMAL LOW (ref 3.87–5.11)
RDW: 14.3 % (ref 11.5–15.5)
WBC Count: 11.3 10*3/uL — ABNORMAL HIGH (ref 4.0–10.5)
nRBC: 0 % (ref 0.0–0.2)

## 2020-03-11 LAB — CMP (CANCER CENTER ONLY)
ALT: 13 U/L (ref 0–44)
AST: 22 U/L (ref 15–41)
Albumin: 2.9 g/dL — ABNORMAL LOW (ref 3.5–5.0)
Alkaline Phosphatase: 69 U/L (ref 38–126)
Anion gap: 7 (ref 5–15)
BUN: 7 mg/dL — ABNORMAL LOW (ref 8–23)
CO2: 26 mmol/L (ref 22–32)
Calcium: 9.5 mg/dL (ref 8.9–10.3)
Chloride: 98 mmol/L (ref 98–111)
Creatinine: 1.13 mg/dL — ABNORMAL HIGH (ref 0.44–1.00)
GFR, Estimated: 53 mL/min — ABNORMAL LOW (ref >60)
Glucose, Bld: 105 mg/dL — ABNORMAL HIGH (ref 70–99)
Potassium: 3.4 mmol/L — ABNORMAL LOW (ref 3.5–5.1)
Sodium: 131 mmol/L — ABNORMAL LOW (ref 135–145)
Total Bilirubin: 0.5 mg/dL (ref 0.3–1.2)
Total Protein: 6.7 g/dL (ref 6.5–8.1)

## 2020-03-11 LAB — SAMPLE TO BLOOD BANK

## 2020-03-11 MED ORDER — PALONOSETRON HCL INJECTION 0.25 MG/5ML
INTRAVENOUS | Status: AC
  Filled 2020-03-11: qty 5

## 2020-03-11 MED ORDER — DOXORUBICIN HCL CHEMO IV INJECTION 2 MG/ML
150.0000 mg | Freq: Once | INTRAVENOUS | Status: AC
  Administered 2020-03-11: 150 mg via INTRAVENOUS
  Filled 2020-03-11: qty 75

## 2020-03-11 MED ORDER — SODIUM CHLORIDE 0.9 % IV SOLN
Freq: Once | INTRAVENOUS | Status: AC
  Filled 2020-03-11: qty 250

## 2020-03-11 MED ORDER — SODIUM CHLORIDE 0.9 % IV SOLN
150.0000 mg | Freq: Once | INTRAVENOUS | Status: AC
  Administered 2020-03-11: 150 mg via INTRAVENOUS
  Filled 2020-03-11: qty 150

## 2020-03-11 MED ORDER — SODIUM CHLORIDE 0.9% FLUSH
10.0000 mL | Freq: Once | INTRAVENOUS | Status: AC
  Administered 2020-03-11: 10 mL
  Filled 2020-03-11: qty 10

## 2020-03-11 MED ORDER — PALONOSETRON HCL INJECTION 0.25 MG/5ML
0.2500 mg | Freq: Once | INTRAVENOUS | Status: AC
  Administered 2020-03-11: 0.25 mg via INTRAVENOUS

## 2020-03-11 MED ORDER — SODIUM CHLORIDE 0.9 % IV SOLN
10.0000 mg | Freq: Once | INTRAVENOUS | Status: AC
  Administered 2020-03-11: 10 mg via INTRAVENOUS
  Filled 2020-03-11: qty 10

## 2020-03-11 MED ORDER — HEPARIN SOD (PORK) LOCK FLUSH 100 UNIT/ML IV SOLN
500.0000 [IU] | Freq: Once | INTRAVENOUS | Status: AC | PRN
  Administered 2020-03-11: 500 [IU]
  Filled 2020-03-11: qty 5

## 2020-03-11 MED ORDER — SODIUM CHLORIDE 0.9% FLUSH
10.0000 mL | INTRAVENOUS | Status: DC | PRN
  Administered 2020-03-11: 10 mL
  Filled 2020-03-11: qty 10

## 2020-03-11 NOTE — Patient Instructions (Signed)
[facility] Discharge Instructions for Patients Receiving Chemotherapy  Today you received the following chemotherapy agents Adriamycin   To help prevent nausea and vomiting after your treatment, we encourage you to take your nausea medication as directed.    If you develop nausea and vomiting that is not controlled by your nausea medication, call the clinic.   BELOW ARE SYMPTOMS THAT SHOULD BE REPORTED IMMEDIATELY:  *FEVER GREATER THAN 100.5 F  *CHILLS WITH OR WITHOUT FEVER  NAUSEA AND VOMITING THAT IS NOT CONTROLLED WITH YOUR NAUSEA MEDICATION  *UNUSUAL SHORTNESS OF BREATH  *UNUSUAL BRUISING OR BLEEDING  TENDERNESS IN MOUTH AND THROAT WITH OR WITHOUT PRESENCE OF ULCERS  *URINARY PROBLEMS  *BOWEL PROBLEMS  UNUSUAL RASH Items with * indicate a potential emergency and should be followed up as soon as possible.  Feel free to call the clinic should you have any questions or concerns. The clinic phone number is [phone].  Please show the [facility] at check-in to the Emergency Department and triage nurse.  Doxorubicin injection What is this medicine? DOXORUBICIN (dox oh ROO bi sin) is a chemotherapy drug. It is used to treat many kinds of cancer like leukemia, lymphoma, neuroblastoma, sarcoma, and Wilms' tumor. It is also used to treat bladder cancer, breast cancer, lung cancer, ovarian cancer, stomach cancer, and thyroid cancer. This medicine may be used for other purposes; ask your health care provider or pharmacist if you have questions. COMMON BRAND NAME(S): Adriamycin, Adriamycin PFS, Adriamycin RDF, Rubex What should I tell my health care provider before I take this medicine? They need to know if you have any of these conditions:  heart disease  history of low blood counts caused by a medicine  liver disease  recent or ongoing radiation therapy  an unusual or allergic reaction to doxorubicin, other chemotherapy agents, other medicines,  foods, dyes, or preservatives  pregnant or trying to get pregnant  breast-feeding How should I use this medicine? This drug is given as an infusion into a vein. It is administered in a hospital or clinic by a specially trained health care professional. If you have pain, swelling, burning or any unusual feeling around the site of your injection, tell your health care professional right away. Talk to your pediatrician regarding the use of this medicine in children. Special care may be needed. Overdosage: If you think you have taken too much of this medicine contact a poison control center or emergency room at once. NOTE: This medicine is only for you. Do not share this medicine with others. What if I miss a dose? It is important not to miss your dose. Call your doctor or health care professional if you are unable to keep an appointment. What may interact with this medicine? This medicine may interact with the following medications:  6-mercaptopurine  paclitaxel  phenytoin  St. John's Wort  trastuzumab  verapamil This list may not describe all possible interactions. Give your health care provider a list of all the medicines, herbs, non-prescription drugs, or dietary supplements you use. Also tell them if you smoke, drink alcohol, or use illegal drugs. Some items may interact with your medicine. What should I watch for while using this medicine? This drug may make you feel generally unwell. This is not uncommon, as chemotherapy can affect healthy cells as well as cancer cells. Report any side effects. Continue your course of treatment even though you feel ill unless your doctor tells you to stop. There is a maximum amount  of this medicine you should receive throughout your life. The amount depends on the medical condition being treated and your overall health. Your doctor will watch how much of this medicine you receive in your lifetime. Tell your doctor if you have taken this medicine  before. You may need blood work done while you are taking this medicine. Your urine may turn red for a few days after your dose. This is not blood. If your urine is dark or brown, call your doctor. In some cases, you may be given additional medicines to help with side effects. Follow all directions for their use. Call your doctor or health care professional for advice if you get a fever, chills or sore throat, or other symptoms of a cold or flu. Do not treat yourself. This drug decreases your body's ability to fight infections. Try to avoid being around people who are sick. This medicine may increase your risk to bruise or bleed. Call your doctor or health care professional if you notice any unusual bleeding. Talk to your doctor about your risk of cancer. You may be more at risk for certain types of cancers if you take this medicine. Do not become pregnant while taking this medicine or for 6 months after stopping it. Women should inform their doctor if they wish to become pregnant or think they might be pregnant. Men should not father a child while taking this medicine and for 6 months after stopping it. There is a potential for serious side effects to an unborn child. Talk to your health care professional or pharmacist for more information. Do not breast-feed an infant while taking this medicine. This medicine has caused ovarian failure in some women and reduced sperm counts in some men This medicine may interfere with the ability to have a child. Talk with your doctor or health care professional if you are concerned about your fertility. This medicine may cause a decrease in Co-Enzyme Q-10. You should make sure that you get enough Co-Enzyme Q-10 while you are taking this medicine. Discuss the foods you eat and the vitamins you take with your health care professional. What side effects may I notice from receiving this medicine? Side effects that you should report to your doctor or health care  professional as soon as possible:  allergic reactions like skin rash, itching or hives, swelling of the face, lips, or tongue  breathing problems  chest pain  fast or irregular heartbeat  low blood counts - this medicine may decrease the number of white blood cells, red blood cells and platelets. You may be at increased risk for infections and bleeding.  pain, redness, or irritation at site where injected  signs of infection - fever or chills, cough, sore throat, pain or difficulty passing urine  signs of decreased platelets or bleeding - bruising, pinpoint red spots on the skin, black, tarry stools, blood in the urine  swelling of the ankles, feet, hands  tiredness  weakness Side effects that usually do not require medical attention (report to your doctor or health care professional if they continue or are bothersome):  diarrhea  hair loss  mouth sores  nail discoloration or damage  nausea  red colored urine  vomiting This list may not describe all possible side effects. Call your doctor for medical advice about side effects. You may report side effects to FDA at 1-800-FDA-1088. Where should I keep my medicine? This drug is given in a hospital or clinic and will not be stored at home.  NOTE: This sheet is a summary. It may not cover all possible information. If you have questions about this medicine, talk to your doctor, pharmacist, or health care provider.  2020 Elsevier/Gold Standard (2016-12-15 11:01:26)

## 2020-03-11 NOTE — Assessment & Plan Note (Signed)
She has multifactorial anemia, likely anemia chronic disease and related to recent surgery She does not need transfusion support today I will bring her back next week and check her hemoglobin and possible transfusion next week

## 2020-03-11 NOTE — Telephone Encounter (Signed)
Scheduled appointment per 10/25 los. Spoke to patient who is aware of appointment date and time.

## 2020-03-11 NOTE — Progress Notes (Signed)
Requested Foundation One testing on accession [id number] with [facility] via email.

## 2020-03-11 NOTE — Assessment & Plan Note (Signed)
Her pain is better controlled with the addition of MS Contin and increased dose of immediate release morphine I will continue to assess pain control next week

## 2020-03-11 NOTE — Assessment & Plan Note (Addendum)
I have reviewed recent pathology report from her surgery last week Even though the biopsy showed high-grade serous ovarian cancer, mixed disease cannot be excluded in the sample may not be representative of her overall disease status The current signed chemotherapy treatment plan would cover both sarcoma and high-grade ovarian cancer She is in agreement to proceed with chemotherapy as soon as possible Her surgical incision is well-healed I will see her next week for further follow-up  PD-L1 testing from previous biopsy is negative.  Per previous discussion, we will send her tissue for Foundation One study

## 2020-03-11 NOTE — Progress Notes (Signed)
[facility] OFF[facility] PROGRESS NOTE  Patient Care Team: Michael Boston, MD as PCP - General (Internal Medicine) Awanda Mink Craige Cotta, RN as Oncology Nurse Navigator (Oncology)  ASSESSMENT & PLAN:  Ovarian cancer on right [facility]) I have reviewed recent pathology report from her surgery last week Even though the biopsy showed high-grade serous ovarian cancer, mixed disease cannot be excluded in the sample may not be representative of her overall disease status The current signed chemotherapy treatment plan would cover both sarcoma and high-grade ovarian cancer She is in agreement to proceed with chemotherapy as soon as possible Her surgical incision is well-healed I will see her next week for further follow-up  PD-L1 testing from previous biopsy is negative.  Per previous discussion, we will send her tissue for Foundation One study  Anemia due to antineoplastic chemotherapy She has multifactorial anemia, likely anemia chronic disease and related to recent surgery She does not need transfusion support today I will bring her back next week and check her hemoglobin and possible transfusion next week  Cancer associated pain Her pain is better controlled with the addition of MS Contin and increased dose of immediate release morphine I will continue to assess pain control next week  Hydronephrosis of right kidney Renal function is stable Monitor closely   No orders of the defined types were placed in this encounter.   All questions were answered. The patient knows to call the clinic with any problems, questions or concerns. The total time spent in the appointment was 20 minutes encounter with patients including review of chart and various tests results, discussions about plan of care and coordination of care plan   [name], MD 03/11/2020 10:01 AM  INTERVAL HISTORY: Please see below for problem oriented charting. She is seen for further follow-up and for pain management and  chemotherapy today I started her on MS Contin yesterday She has better control of her pain She have trace bilateral lower extremity edema She is eating well Her colostomy is working well  SUMMARY OF ONCOLOGIC HISTORY: Oncology History Overview Note  Neg genetics MSI stable MMR normal PD-L1 0%   Ovarian cancer on right ([place])  01/16/2019 Initial Diagnosis   She had vague symptom of dyspareunia. Presented to ER for evaluation in November for abdominal pain and vague symptom of fullness and imaging showed abnormalities   04/16/2019 Imaging   1. Nonvisualization of the appendix. However, there is a large volume of ascites within the abdomen and pelvis which appears partially loculated. In the acute setting findings may be the sequelae of peritonitis. However, there are multiple partially calcified peritoneal nodules throughout the omentum which are concerning for peritoneal carcinomatosis. Noncalcified nodule in the left lower quadrant peritoneal reflection is noted. Further investigation with diagnostic paracentesis is advised. 2. Ill-defined low-density mass within the right iliac fossa is suspected and may represent an ovarian neoplasm. 3. Aortic atherosclerosis. 4. Small bilateral pleural effusions. 5. Hiatal hernia. 6. Prior granulomatous disease.   Aortic Atherosclerosis (ICD10-I70.0).   04/17/2019 Tumor Marker   Patient's tumor was tested for the following markers: CA-125 Results of the tumor marker test revealed 288.   04/18/2019 Procedure   Successful ultrasound-guided diagnostic and therapeutic paracentesis yielding 3.2 liters of peritoneal fluid.   04/18/2019 Pathology Results   FINAL MICROSCOPIC DIAGNOSIS:  - Malignant cells present  - See comment   SPECIMEN ADEQUACY:  Satisfactory for evaluation   DIAGNOSTIC COMMENTS:  The cell block has scattered malignant cells.  These atypical cells are  positive for cytokeraitn 7, MOC-31, Pax8, and WT-1 but negative for TTF-1 and  cytokeratin 20.  Overall, the features are consistent with a primary gynecologic neoplasm   04/20/2019 Cancer Staging   Staging form: Ovary, Fallopian Tube, and Primary Peritoneal Carcinoma, AJCC 8th Edition - Clinical stage from 04/20/2019: FIGO Stage IIIC (cT3c, cN0, cM0) - Signed by Artis Delay, MD on 04/20/2019   04/25/2019 Procedure   Successful ultrasound-guided therapeutic paracentesis yielding 2.5 liters of peritoneal fluid.     04/27/2019 - 09/10/2019 Chemotherapy   The patient had carboplatin and taxol x 3 cycles for neoadjuvant chemotherapy treatment, followed by interval debulking surgery and 3 more cycles of chemotherapy     05/17/2019 Procedure   Placement of a subcutaneous port device. Catheter tip at the SVC and right atrium junction.   06/11/2019 Tumor Marker   Patient's tumor was tested for the following markers: CA-125 Results of the tumor marker test revealed 20.8   06/18/2019 Imaging   CT chest, abdomen and pelvis 1. Positive response to therapy. Complex 6.6 cm cystic right adnexal mass is decreased in size. Omental nodularity has largely resolved. Ascites has resolved. No adenopathy. 2. No evidence of metastatic disease in the chest. 3. Chronic findings include: Aortic Atherosclerosis (ICD10-I70.0). Small hiatal hernia. Mild sigmoid diverticulosis.   07/03/2019 Surgery   Preoperative Diagnosis: ovarian cancer stage IIIC     Procedure(s) Performed: Robotic-assisted laparoscopic bilateral salpingo-oophorectomy, omentectomy, radical tumor debulking with mini-laparotomy. Optimal cytoreduction with no gross residual disease remaining.    Surgeon: Adolphus Birchwood, M.D. Specimens: Bilateral ovaries, fallopian tubes, omentum    Indication for Procedure:  Patient has a history of stage IIIC ovarian cancer, s/p 3 cycles of neoadjuvant chemotherapy with good partial clinical response. Right ovarian mass on imaging and omental caking.   Operative Findings: 6-8cm right ovarian mass  densely adherent to pelvic sidewall with filmy adhesions to colonic epiploica but not bowel wall. Normal appendix. Slightly enlarged left tube and ovary but without discrete mass. Omental nodularity throughout infracolic omentum. Tiny miliary <70mm nodules on left diaphragm.    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 Other Tissue/ Organ Involvement: Left ovary and omentum Largest Extrapelvic Peritoneal Focus: 4.3 cm tumor deposit in the omentum Peritoneal/Ascitic Fluid: Positive for carcinoma ([id number]) Treatment Effect: Moderate response identified (CRS 2) Regional Lymph Nodes: No lymph nodes submitted or found Pathologic Stage Classification (pTNM, AJCC 8th Edition): pT3c, pNx Representative Tumor Block: B10 Comment(s): Only the serous component shows metastasis to the omentum and spread to left ovary and right fallopian tube   07/27/2019 Tumor Marker   Patient's tumor was tested for the following markers: CA-125 Results of the tumor marker test revealed 13.7   08/14/2019 Genetic Testing   Negative germline + somatic testing. No pathogenic variants identified on the [employer]. VUS in MRE11A identified in the germline.  The report date is 08/14/2019.   The CancerNext gene panel offered by [employer] includes sequencing and rearrangement analysis for the following 36 genes: APC*, ATM*, AXIN2, BARD1, BMPR1A, BRCA1*, BRCA2*, BRIP1*, CDH1*, CDK4, CDKN2A, CHEK2*, DICER1, MLH1*, MSH2*, MSH3, MSH6*, MUTYH*, NBN, NF1*, NTHL1, PALB2*, PMS2*, PTEN*, RAD51C*, RAD51D*,  RECQL, SMAD4, SMARCA4, STK11 and TP53* (sequencing and deletion/duplication); HOXB13, POLD1  and POLE (sequencing only); EPCAM and GREM1 (deletion/duplication only).   Somatic genes analyzed through TumorNext-HRD: ATM, BARD1, BRCA1, BRCA2, BRIP1, CHEK2, MRE11A, NBN, PALB2, RAD51C, RAD51D.     09/10/2019 Tumor Marker   Patient's tumor was tested for the following markers: CA-125 Results of the tumor marker test revealed 9.5   10/10/2019 Imaging   1. No findings suspicious for recurrent metastatic disease in the abdomen or pelvis. No adenopathy. Minimal smooth peritoneal thickening in the paracolic gutters and deep pelvis, nonspecific, with no discrete peritoneal implants. No ascites. Continued CT surveillance warranted. 2. Chronic findings include: Aortic Atherosclerosis (ICD10-I70.0). Small hiatal hernia. Minimal sigmoid diverticulosis.     10/10/2019 Tumor Marker   Patient's tumor was tested for the following markers: CA-125 Results of the tumor marker test revealed 8.9   02/18/2020 Tumor Marker   Patient's tumor was tested for the following markers: CA-125. Results of the tumor marker test revealed 10.9   02/18/2020 Imaging   No acute abnormality noted.     02/21/2020 Imaging   1. Complex cystic mass versus less likely complex abscess along the vaginal cuff and extending anterior to the rectum and along the right lateral side of the rectosigmoid junction, with components of the process extending up towards the adnexa, right greater than left. This measures up to 10.9 cm in diameter. If this represents an abscess I would expect the patient have striking symptoms (fever, pain, etc.). Given the patient's history, the appearance tends to favor recurrent ovarian malignancy over abscess. 2. Mild right hydronephrosis and hydroureter extending down to the level of the recurrent pelvic mass, indicating some low-grade obstruction. 3. There is sigmoid colon diverticulosis along with some stranding and  wall indistinctness of the sigmoid colon which could reflect diverticulitis or colitis. Assessment is made more complex by the proximity of the pelvic mass. 4. Prominence of stool proximal to the sigmoid colon possibly from constipation or low-grade partial obstruction. 5. Other imaging findings of potential clinical significance: Small type 1 hiatal hernia. Old granulomatous disease. Lumbar spondylosis and degenerative disc disease causing impingement at L3-4, L4-5, and L5-S1. Mild rectus diastasis with transverse colon and adipose tissue extending along the diastatic region. 6. Aortic atherosclerosis.   02/27/2020 Echocardiogram   1. Left ventricular ejection fraction, by estimation, is 60 to 65%. The left ventricle has normal function. The left ventricle has no regional wall motion abnormalities. Left ventricular diastolic parameters are consistent with Grade I diastolic dysfunction (impaired relaxation). The average left ventricular global longitudinal strain is -18.3 %.  2. Right ventricular systolic function is normal. The right ventricular size is normal. There is normal pulmonary artery systolic pressure. The estimated right ventricular systolic pressure is 94.8 mmHg.  3. The mitral valve is normal in structure. Trivial mitral valve regurgitation.  4. The aortic valve is tricuspid. Aortic valve regurgitation is mild. No aortic stenosis is present.  5. The inferior vena cava is normal in size with greater than 50% respiratory variability, suggesting right atrial pressure of 3 mmHg.   03/02/2020 - 03/06/2020 Hospital Admission   She was admitted for management of bowel obstruction   03/04/2020 Pathology Results   High grade carcinoma consistent with high grade serous carcinoma.   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   Surgeon: Thereasa Solo, MD.  Operative Findings: a  15cm fixed pelvic mass filling the pelvis and obstructing the sigmoid colon. Moderately distended transverse colon. Normal caliber small intestine. Miliary studding of diaphragm with tumor. No other significant intraperitoneal disease. Ventral incisional hernia in the site of the prior midline vertical epigastric incision.   03/11/2020 -  Chemotherapy   The patient had DOXOrubicin (ADRIAMYCIN) chemo injection 142 mg, 75 mg/m2 = 142 mg, Intravenous,  Once, 1 of 4 cycles palonosetron (ALOXI) injection 0.25 mg, 0.25 mg, Intravenous,  Once, 1 of 4 cycles fosaprepitant (EMEND) 150 mg in sodium chloride 0.9 % 145 mL IVPB, 150 mg, Intravenous,  Once, 1 of 4 cycles  for chemotherapy treatment.    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      REVIEW OF SYSTEMS:   Constitutional: Denies fevers, chills or abnormal weight loss Eyes: Denies blurriness of vision Ears, nose, mouth, throat, and face: Denies mucositis or sore throat Respiratory: Denies cough, dyspnea or wheezes Cardiovascular: Denies palpitation, chest discomfort  Gastrointestinal:  Denies nausea, heartburn or change in bowel habits Skin: Denies abnormal skin rashes Lymphatics: Denies new lymphadenopathy or easy bruising Neurological:Denies numbness, tingling or new weaknesses Behavioral/Psych: Mood is stable, no new changes  All  other systems were reviewed with the patient and are negative.  I have reviewed the past medical history, past surgical history, social history and family history with the patient and they are unchanged from previous note.  ALLERGIES:  is allergic to bee venom, amlodipine, demerol [meperidine hcl], lisinopril, losartan, and other.  MEDICATIONS:  Current Outpatient Medications  Medication Sig Dispense Refill  . ALPRAZolam (XANAX) 0.25 MG tablet Take 0.25 mg by mouth daily as needed for sleep.    . Cholecalciferol (VITAMIN D-3 PO) Take 4,000 Units by mouth daily.     Marland Kitchen enoxaparin (LOVENOX) 40 MG/0.4ML injection Inject 0.4 mLs (40 mg total) into the skin daily for 26 doses. 10.4 mL 0  . estradiol (ESTRACE VAGINAL) 0.1 MG/GM vaginal cream Place 1 Applicatorful vaginally 3 (three) times a week. (Patient taking differently: Place 1 Applicatorful vaginally as needed. ) 42.5 g 12  . ferrous gluconate (FERGON) 324 MG tablet Take 1 tablet (324 mg total) by mouth daily with breakfast. (Patient not taking: Reported on 03/10/2020) 30 tablet 1  . FLUoxetine (PROZAC) 20 MG capsule Take 1 capsule (20 mg total) by mouth daily. 90 capsule 3  . lidocaine-prilocaine (EMLA) cream Apply to affected area once (Patient taking differently: Apply 1 application topically daily as needed (port access). ) 30 g 3  . loratadine (CLARITIN) 10 MG tablet Take 10 mg by mouth daily as needed for allergies.    Marland Kitchen morphine (MS CONTIN) 30 MG 12 hr tablet Take 1 tablet (30 mg total) by mouth every 8 (eight) hours. 90 tablet 0  . morphine (MSIR) 30 MG tablet Take 1 tablet (30 mg total) by mouth every 4 (four) hours as needed for severe pain. 60 tablet 0  . ondansetron (ZOFRAN) 8 MG tablet Take 8 mg by mouth every 8 (eight) hours  as needed for nausea or vomiting.     . polyethylene glycol (MIRALAX / GLYCOLAX) 17 g packet Take 17 g by mouth 2 (two) times daily.    . prochlorperazine (COMPAZINE) 10 MG tablet Take 10 mg by mouth every 6  (six) hours as needed for nausea or vomiting.     . promethazine (PHENERGAN) 25 MG tablet Take 1 tablet (25 mg total) by mouth every 6 (six) hours as needed for nausea or vomiting. 30 tablet 0  . simvastatin (ZOCOR) 40 MG tablet Take 1 tablet (40 mg total) by mouth at bedtime. 90 tablet 4   No current facility-administered medications for this visit.   Facility-Administered Medications Ordered in Other Visits  Medication Dose Route Frequency Provider Last Rate Last Admin  . dexamethasone (DECADRON) 10 mg in sodium chloride 0.9 % 50 mL IVPB  10 mg Intravenous Once Alvy Bimler, Takyia Sindt, MD      . DOXOrubicin (ADRIAMYCIN) chemo injection 150 mg  150 mg Intravenous Once Alvy Bimler, Nihal Doan, MD      . fosaprepitant (EMEND) 150 mg in sodium chloride 0.9 % 145 mL IVPB  150 mg Intravenous Once Tandi Hanko, MD      . heparin lock flush 100 unit/mL  500 Units Intracatheter Once PRN Alvy Bimler, Arjan Strohm, MD      . sodium chloride flush (NS) 0.9 % injection 10 mL  10 mL Intracatheter PRN Alvy Bimler, Finleigh Cheong, MD        PHYSICAL EXAMINATION: ECOG PERFORMANCE STATUS: 1 - Symptomatic but completely ambulatory  Vitals:   03/11/20 0837  BP: (!) 157/75  Pulse: 99  Resp: 18  Temp: 97.6 F (36.4 C)  SpO2: 99%   Filed Weights   03/11/20 0837  Weight: 192 lb (87.1 kg)    GENERAL:alert, no distress and comfortable NEURO: alert & oriented x 3 with fluent speech, no focal motor/sensory deficits  LABORATORY DATA:  I have reviewed the data as listed    Component Value Date/Time   NA 131 (L) 03/11/2020 0820   NA 137 10/27/2011 0738   K 3.4 (L) 03/11/2020 0820   K 4.1 10/27/2011 0738   CL 98 03/11/2020 0820   CL 105 10/27/2011 0738   CO2 26 03/11/2020 0820   CO2 26 10/27/2011 0738   GLUCOSE 105 (H) 03/11/2020 0820   GLUCOSE 111 (H) 10/27/2011 0738   BUN 7 (L) 03/11/2020 0820   BUN 15 10/27/2011 0738   CREATININE 1.13 (H) 03/11/2020 0820   CREATININE 1.01 (H) 12/16/2015 1030   CALCIUM 9.5 03/11/2020 0820   CALCIUM 10.0  09/28/2013 0000   PROT 6.7 03/11/2020 0820   PROT 8.1 10/27/2011 0738   ALBUMIN 2.9 (L) 03/11/2020 0820   ALBUMIN 3.9 10/27/2011 0738   AST 22 03/11/2020 0820   ALT 13 03/11/2020 0820   ALT 29 10/27/2011 0738   ALKPHOS 69 03/11/2020 0820   ALKPHOS 102 10/27/2011 0738   BILITOT 0.5 03/11/2020 0820   GFRNONAA 53 (L) 03/11/2020 0820   GFRNONAA 59 (L) 12/16/2015 1030   GFRAA >60 02/18/2020 1306   GFRAA 68 12/16/2015 1030    No results found for: SPEP, UPEP  Lab Results  Component Value Date   WBC 11.3 (H) 03/11/2020   NEUTROABS 7.8 (H) 03/11/2020   HGB 8.2 (L) 03/11/2020   HCT 24.3 (L) 03/11/2020   MCV 90.3 03/11/2020   PLT 399 03/11/2020      Chemistry      Component Value Date/Time   NA 131 (L) 03/11/2020 0820  NA 137 10/27/2011 0738   K 3.4 (L) 03/11/2020 0820   K 4.1 10/27/2011 0738   CL 98 03/11/2020 0820   CL 105 10/27/2011 0738   CO2 26 03/11/2020 0820   CO2 26 10/27/2011 0738   BUN 7 (L) 03/11/2020 0820   BUN 15 10/27/2011 0738   CREATININE 1.13 (H) 03/11/2020 0820   CREATININE 1.01 (H) 12/16/2015 1030   GLU 98 09/28/2013 0000      Component Value Date/Time   CALCIUM 9.5 03/11/2020 0820   CALCIUM 10.0 09/28/2013 0000   ALKPHOS 69 03/11/2020 0820   ALKPHOS 102 10/27/2011 0738   AST 22 03/11/2020 0820   ALT 13 03/11/2020 0820   ALT 29 10/27/2011 0738   BILITOT 0.5 03/11/2020 0820

## 2020-03-11 NOTE — Assessment & Plan Note (Signed)
Renal function is stable. Monitor closely 

## 2020-03-11 NOTE — Telephone Encounter (Signed)
Scheduled appointments per 10/26 sch msg. Spoke to patient who is aware of appointment date and time.

## 2020-03-12 DIAGNOSIS — Z48815 Encounter for surgical aftercare following surgery on the digestive system: Secondary | ICD-10-CM | POA: Diagnosis not present

## 2020-03-12 DIAGNOSIS — G893 Neoplasm related pain (acute) (chronic): Secondary | ICD-10-CM | POA: Diagnosis not present

## 2020-03-12 DIAGNOSIS — D630 Anemia in neoplastic disease: Secondary | ICD-10-CM | POA: Diagnosis not present

## 2020-03-12 DIAGNOSIS — F419 Anxiety disorder, unspecified: Secondary | ICD-10-CM | POA: Diagnosis not present

## 2020-03-12 DIAGNOSIS — C561 Malignant neoplasm of right ovary: Secondary | ICD-10-CM | POA: Diagnosis not present

## 2020-03-12 DIAGNOSIS — K5900 Constipation, unspecified: Secondary | ICD-10-CM | POA: Diagnosis not present

## 2020-03-12 DIAGNOSIS — F32A Depression, unspecified: Secondary | ICD-10-CM | POA: Diagnosis not present

## 2020-03-12 DIAGNOSIS — Z433 Encounter for attention to colostomy: Secondary | ICD-10-CM | POA: Diagnosis not present

## 2020-03-12 DIAGNOSIS — I10 Essential (primary) hypertension: Secondary | ICD-10-CM | POA: Diagnosis not present

## 2020-03-12 NOTE — Progress Notes (Signed)
GYN ONC Post-op Follow up  HPI: Connie Heath is a 69 year old female with recurrent carcinosarcoma of the right ovary with primary therapy completed in April 2021 with recurrence diagnosed in Sept 2021. She presented to the ER on 03/02/2020 with abdominal pain and constipation. CT imaging on 03/02/20 showed rapid enlargement of complex pelvic mass lesion over the last 10 days, causing increased obstruction of the adherent sigmoid colon and increased obstruction of the right ureter. Possibilities include rapidly growing tumor or abscess. It would be somewhat unusual for abscess to cause this degree of ureteral and bowel obstruction.  On 03/04/2020, the patient underwent the following: Procedure(s): EXPLORATORY LAPAROTOMY WITH TRANSVERSE LOOP COLOSTOMY, HERNIA REPAIR VENTRAL ADULT with Dr. Everitt Amber. The postoperative course was uneventful.  She was discharged to home on postoperative day 2 with home health arranged.  Interval History: She presents presents today for postop follow-up and an incision check prior to resuming chemotherapy this upcoming week.  She sought care in the emergency room on March 08, 2020 for abdominal pain, nausea, and decreased output from her colostomy.  A CT scan was performed and resulted: no evidence of bowel obstruction.  Stomach small bowel normal. 2. Post laparoscopic transverse loop colostomy without complicating features. 3. Persistent moderate RIGHT ureteral obstruction. 4. Pelvic mass measures larger in short interval follow-up from 03/02/2020. Today, she continues to report severe pain with little relief from oxycodone. She has intermittent nausea and decreased appetite which she relates to the pain. Her colostomy has had output and she has been taking Miralax. Voiding without difficulty. No issues reported with her abdominal incision.   Exam: General: Well developed, well nourished female in no acute distress. Alert and oriented x 3. Appears uncomfortable related to  pain. Appearing pale.  Cardiovascular: Regularly irregular rhythm. S1 and S2 normal with extra beat noted every 5 seconds.  Lungs: Clear to auscultation bilaterally. No wheezes/crackles/rhonchi noted.  Abdomen: Abdomen soft, active bowel sounds. Tender on palpation. Colostomy bag removed without difficulty. Steri strips removed from the midline abdominal incision. No drainage or erythema noted. No excoriation noted around the stoma. Stoma pink, moist without retraction. New appliance applied and patient tolerated well. Dr. Alvy Bimler to the room to assess the incision prior to applying new steri strips. The appliance is a two piece and the wafer/border covers the incision.   Extremities: New right pedal edema noted. No bilateral cyanosis or clubbing.   Plan: No evidence of incisional infection on examination today. Pain medication adjustments made by Dr. Alvy Bimler, who discussed with the patient as well. She is to follow up as planned for tomorrow with Dr. Alvy Bimler and to resume chemotherapy. Advised to call for any needs or concerns. Reportable signs and symptoms reviewed.  Discussed ways to increase protein intake. Doppler imaging not needed at this time of the RLE per Dr. Alvy Bimler.

## 2020-03-14 ENCOUNTER — Ambulatory Visit: Payer: Medicare HMO

## 2020-03-14 DIAGNOSIS — F32A Depression, unspecified: Secondary | ICD-10-CM | POA: Diagnosis not present

## 2020-03-14 DIAGNOSIS — Z48815 Encounter for surgical aftercare following surgery on the digestive system: Secondary | ICD-10-CM | POA: Diagnosis not present

## 2020-03-14 DIAGNOSIS — C561 Malignant neoplasm of right ovary: Secondary | ICD-10-CM | POA: Diagnosis not present

## 2020-03-14 DIAGNOSIS — Z433 Encounter for attention to colostomy: Secondary | ICD-10-CM | POA: Diagnosis not present

## 2020-03-14 DIAGNOSIS — G893 Neoplasm related pain (acute) (chronic): Secondary | ICD-10-CM | POA: Diagnosis not present

## 2020-03-14 DIAGNOSIS — K5900 Constipation, unspecified: Secondary | ICD-10-CM | POA: Diagnosis not present

## 2020-03-14 DIAGNOSIS — I10 Essential (primary) hypertension: Secondary | ICD-10-CM | POA: Diagnosis not present

## 2020-03-14 DIAGNOSIS — F419 Anxiety disorder, unspecified: Secondary | ICD-10-CM | POA: Diagnosis not present

## 2020-03-14 DIAGNOSIS — D630 Anemia in neoplastic disease: Secondary | ICD-10-CM | POA: Diagnosis not present

## 2020-03-18 ENCOUNTER — Inpatient Hospital Stay (HOSPITAL_BASED_OUTPATIENT_CLINIC_OR_DEPARTMENT_OTHER): Payer: Medicare HMO | Admitting: Hematology and Oncology

## 2020-03-18 ENCOUNTER — Inpatient Hospital Stay: Payer: Medicare HMO | Attending: Gynecologic Oncology

## 2020-03-18 ENCOUNTER — Other Ambulatory Visit: Payer: Self-pay | Admitting: Hematology and Oncology

## 2020-03-18 ENCOUNTER — Inpatient Hospital Stay: Payer: Medicare HMO

## 2020-03-18 ENCOUNTER — Other Ambulatory Visit: Payer: Self-pay

## 2020-03-18 DIAGNOSIS — G893 Neoplasm related pain (acute) (chronic): Secondary | ICD-10-CM

## 2020-03-18 DIAGNOSIS — I351 Nonrheumatic aortic (valve) insufficiency: Secondary | ICD-10-CM | POA: Diagnosis not present

## 2020-03-18 DIAGNOSIS — E440 Moderate protein-calorie malnutrition: Secondary | ICD-10-CM | POA: Insufficient documentation

## 2020-03-18 DIAGNOSIS — T451X5A Adverse effect of antineoplastic and immunosuppressive drugs, initial encounter: Secondary | ICD-10-CM | POA: Insufficient documentation

## 2020-03-18 DIAGNOSIS — K1231 Oral mucositis (ulcerative) due to antineoplastic therapy: Secondary | ICD-10-CM | POA: Insufficient documentation

## 2020-03-18 DIAGNOSIS — C786 Secondary malignant neoplasm of retroperitoneum and peritoneum: Secondary | ICD-10-CM | POA: Insufficient documentation

## 2020-03-18 DIAGNOSIS — C561 Malignant neoplasm of right ovary: Secondary | ICD-10-CM

## 2020-03-18 DIAGNOSIS — R0602 Shortness of breath: Secondary | ICD-10-CM | POA: Diagnosis not present

## 2020-03-18 DIAGNOSIS — D6481 Anemia due to antineoplastic chemotherapy: Secondary | ICD-10-CM

## 2020-03-18 DIAGNOSIS — Z5111 Encounter for antineoplastic chemotherapy: Secondary | ICD-10-CM | POA: Diagnosis present

## 2020-03-18 DIAGNOSIS — C495 Malignant neoplasm of connective and soft tissue of pelvis: Secondary | ICD-10-CM

## 2020-03-18 DIAGNOSIS — I700 Atherosclerosis of aorta: Secondary | ICD-10-CM | POA: Diagnosis not present

## 2020-03-18 DIAGNOSIS — R5383 Other fatigue: Secondary | ICD-10-CM | POA: Insufficient documentation

## 2020-03-18 DIAGNOSIS — K449 Diaphragmatic hernia without obstruction or gangrene: Secondary | ICD-10-CM | POA: Insufficient documentation

## 2020-03-18 DIAGNOSIS — Z79899 Other long term (current) drug therapy: Secondary | ICD-10-CM | POA: Insufficient documentation

## 2020-03-18 DIAGNOSIS — R42 Dizziness and giddiness: Secondary | ICD-10-CM | POA: Insufficient documentation

## 2020-03-18 DIAGNOSIS — D539 Nutritional anemia, unspecified: Secondary | ICD-10-CM

## 2020-03-18 DIAGNOSIS — C763 Malignant neoplasm of pelvis: Secondary | ICD-10-CM | POA: Insufficient documentation

## 2020-03-18 LAB — CMP (CANCER CENTER ONLY)
ALT: 10 U/L (ref 0–44)
AST: 21 U/L (ref 15–41)
Albumin: 2.7 g/dL — ABNORMAL LOW (ref 3.5–5.0)
Alkaline Phosphatase: 68 U/L (ref 38–126)
Anion gap: 9 (ref 5–15)
BUN: 13 mg/dL (ref 8–23)
CO2: 27 mmol/L (ref 22–32)
Calcium: 9.3 mg/dL (ref 8.9–10.3)
Chloride: 97 mmol/L — ABNORMAL LOW (ref 98–111)
Creatinine: 0.98 mg/dL (ref 0.44–1.00)
GFR, Estimated: >60 mL/min (ref >60)
Glucose, Bld: 111 mg/dL — ABNORMAL HIGH (ref 70–99)
Potassium: 4 mmol/L (ref 3.5–5.1)
Sodium: 133 mmol/L — ABNORMAL LOW (ref 135–145)
Total Bilirubin: 0.6 mg/dL (ref 0.3–1.2)
Total Protein: 6.7 g/dL (ref 6.5–8.1)

## 2020-03-18 LAB — CBC WITH DIFFERENTIAL (CANCER CENTER ONLY)
Abs Immature Granulocytes: 0 10*3/uL (ref 0.00–0.07)
Basophils Absolute: 0 10*3/uL (ref 0.0–0.1)
Basophils Relative: 1 %
Eosinophils Absolute: 0 10*3/uL (ref 0.0–0.5)
Eosinophils Relative: 1 %
HCT: 20.7 % — ABNORMAL LOW (ref 36.0–46.0)
Hemoglobin: 6.8 g/dL — CL (ref 12.0–15.0)
Lymphocytes Relative: 17 %
Lymphs Abs: 0.5 10*3/uL — ABNORMAL LOW (ref 0.7–4.0)
MCH: 29.8 pg (ref 26.0–34.0)
MCHC: 32.9 g/dL (ref 30.0–36.0)
MCV: 90.8 fL (ref 80.0–100.0)
Monocytes Absolute: 0 10*3/uL — ABNORMAL LOW (ref 0.1–1.0)
Monocytes Relative: 0 %
Neutro Abs: 2.3 10*3/uL (ref 1.7–7.7)
Neutrophils Relative %: 81 %
Platelet Count: 214 10*3/uL (ref 150–400)
RBC: 2.28 MIL/uL — ABNORMAL LOW (ref 3.87–5.11)
RDW: 13.6 % (ref 11.5–15.5)
WBC Count: 2.9 10*3/uL — ABNORMAL LOW (ref 4.0–10.5)
nRBC: 0 % (ref 0.0–0.2)

## 2020-03-18 LAB — SAMPLE TO BLOOD BANK

## 2020-03-18 LAB — PREPARE RBC (CROSSMATCH)

## 2020-03-18 MED ORDER — SODIUM CHLORIDE 0.9% IV SOLUTION
250.0000 mL | Freq: Once | INTRAVENOUS | Status: AC
  Administered 2020-03-18: 250 mL via INTRAVENOUS
  Filled 2020-03-18: qty 250

## 2020-03-18 MED ORDER — SODIUM CHLORIDE 0.9% FLUSH
10.0000 mL | INTRAVENOUS | Status: AC | PRN
  Administered 2020-03-18: 10 mL
  Filled 2020-03-18: qty 10

## 2020-03-18 MED ORDER — DIPHENHYDRAMINE HCL 25 MG PO CAPS
25.0000 mg | ORAL_CAPSULE | Freq: Once | ORAL | Status: AC
  Administered 2020-03-18: 25 mg via ORAL

## 2020-03-18 MED ORDER — HEPARIN SOD (PORK) LOCK FLUSH 100 UNIT/ML IV SOLN
250.0000 [IU] | INTRAVENOUS | Status: AC | PRN
  Administered 2020-03-18: 500 [IU]
  Filled 2020-03-18: qty 5

## 2020-03-18 MED ORDER — SODIUM CHLORIDE 0.9% FLUSH
10.0000 mL | Freq: Once | INTRAVENOUS | Status: DC
  Filled 2020-03-18: qty 10

## 2020-03-18 MED ORDER — ACETAMINOPHEN 325 MG PO TABS
650.0000 mg | ORAL_TABLET | Freq: Once | ORAL | Status: AC
  Administered 2020-03-18: 650 mg via ORAL

## 2020-03-18 MED ORDER — ACETAMINOPHEN 325 MG PO TABS
ORAL_TABLET | ORAL | Status: AC
  Filled 2020-03-18: qty 2

## 2020-03-18 MED ORDER — DIPHENHYDRAMINE HCL 25 MG PO CAPS
ORAL_CAPSULE | ORAL | Status: AC
  Filled 2020-03-18: qty 1

## 2020-03-18 MED ORDER — SODIUM CHLORIDE 0.9% FLUSH
3.0000 mL | INTRAVENOUS | Status: AC | PRN
  Filled 2020-03-18: qty 10

## 2020-03-18 NOTE — Progress Notes (Signed)
Reported critical HGB of 6.8 to Dr. Alvy Bimler.

## 2020-03-18 NOTE — Patient Instructions (Signed)

## 2020-03-19 ENCOUNTER — Encounter: Payer: Self-pay | Admitting: Hematology and Oncology

## 2020-03-19 DIAGNOSIS — F32A Depression, unspecified: Secondary | ICD-10-CM | POA: Diagnosis not present

## 2020-03-19 DIAGNOSIS — G893 Neoplasm related pain (acute) (chronic): Secondary | ICD-10-CM | POA: Diagnosis not present

## 2020-03-19 DIAGNOSIS — Z48815 Encounter for surgical aftercare following surgery on the digestive system: Secondary | ICD-10-CM | POA: Diagnosis not present

## 2020-03-19 DIAGNOSIS — I10 Essential (primary) hypertension: Secondary | ICD-10-CM | POA: Diagnosis not present

## 2020-03-19 DIAGNOSIS — K5900 Constipation, unspecified: Secondary | ICD-10-CM | POA: Diagnosis not present

## 2020-03-19 DIAGNOSIS — F419 Anxiety disorder, unspecified: Secondary | ICD-10-CM | POA: Diagnosis not present

## 2020-03-19 DIAGNOSIS — E440 Moderate protein-calorie malnutrition: Secondary | ICD-10-CM | POA: Insufficient documentation

## 2020-03-19 DIAGNOSIS — C561 Malignant neoplasm of right ovary: Secondary | ICD-10-CM | POA: Diagnosis not present

## 2020-03-19 DIAGNOSIS — Z433 Encounter for attention to colostomy: Secondary | ICD-10-CM | POA: Diagnosis not present

## 2020-03-19 DIAGNOSIS — D630 Anemia in neoplastic disease: Secondary | ICD-10-CM | POA: Diagnosis not present

## 2020-03-19 LAB — CA 125: Cancer Antigen (CA) 125: 49.7 U/mL — ABNORMAL HIGH (ref 0.0–38.1)

## 2020-03-19 LAB — TYPE AND SCREEN
ABO/RH(D): O POS
Antibody Screen: NEGATIVE
Unit division: 0

## 2020-03-19 LAB — BPAM RBC
Blood Product Expiration Date: 202111302359
ISSUE DATE / TIME: 202111021449
Unit Type and Rh: 5100

## 2020-03-19 NOTE — Assessment & Plan Note (Signed)
We discussed some of the risks, benefits, and alternatives of blood transfusions. The patient is symptomatic from anemia and the hemoglobin level is critically low.  Some of the side-effects to be expected including risks of transfusion reactions, chills, infection, syndrome of volume overload and risk of hospitalization from various reasons and the patient is willing to proceed and went ahead to sign consent today. This is due to side effects of treatment I will see her again next week for further follow-up We will transfuse a unit of blood today and again next week, probably, with plan to keep her hemoglobin greater than 8

## 2020-03-19 NOTE — Assessment & Plan Note (Signed)
She has profound fatigue with recent treatment along with some mild nausea She is found to be profoundly anemic and will receive transfusion support today I will see her next week again for further follow-up

## 2020-03-19 NOTE — Assessment & Plan Note (Signed)
Recently, I added MS Contin Her pain is better controlled She denies constipation or nausea with that We will continue the same prescribed pain medicine and I will reassess pain control next visit

## 2020-03-19 NOTE — Progress Notes (Signed)
[facility] OFFICE PROGRESS NOTE  Patient Care Team: Michael Boston, MD as PCP - General (Internal Medicine) Awanda Mink Craige Cotta, RN as Oncology Nurse Navigator (Oncology)  ASSESSMENT & PLAN:  Ovarian cancer on right [facility]) She has profound fatigue with recent treatment along with some mild nausea She is found to be profoundly anemic and will receive transfusion support today I will see her next week again for further follow-up  Anemia due to antineoplastic chemotherapy We discussed some of the risks, benefits, and alternatives of blood transfusions. The patient is symptomatic from anemia and the hemoglobin level is critically low.  Some of the side-effects to be expected including risks of transfusion reactions, chills, infection, syndrome of volume overload and risk of hospitalization from various reasons and the patient is willing to proceed and went ahead to sign consent today. This is due to side effects of treatment I will see her again next week for further follow-up We will transfuse a unit of blood today and again next week, probably, with plan to keep her hemoglobin greater than 8  Cancer associated pain Recently, I added MS Contin Her pain is better controlled She denies constipation or nausea with that We will continue the same prescribed pain medicine and I will reassess pain control next visit  Protein-calorie malnutrition, moderate ([place]) She is noted to have progressive moderate protein calorie malnutrition She will try her best to keep up with her nutritional intake She has no clinical signs or symptoms of bowel obstruction in her colostomy appears to be functioning well   Orders Placed This Encounter  Procedures  . Prepare RBC (crossmatch)    Standing Status:   Standing    Number of Occurrences:   1    Order Specific Question:   # of Units    Answer:   1 unit    Order Specific Question:   Transfusion Indications    Answer:   Symptomatic Anemia    Order  Specific Question:   Special Requirements    Answer:   Irradiated    Order Specific Question:   Number of Units to Keep Ahead    Answer:   NO units ahead    Order Specific Question:   Instructions:    Answer:   Transfuse    Order Specific Question:   If emergent release call blood bank    Answer:   Not emergent release    All questions were answered. The patient knows to call the clinic with any problems, questions or concerns. The total time spent in the appointment was 30 minutes encounter with patients including review of chart and various tests results, discussions about plan of care and coordination of care plan   [name], MD 03/19/2020 8:24 AM  INTERVAL HISTORY: Please see below for problem oriented charting. She is seen for further follow-up She complained of profound fatigue, with dizziness and shortness of breath since her last visit Her pain is better controlled and she is taking MS Contin twice daily along with breakthrough pain medicine once or twice a day Her colostomy is functioning well The patient denies any recent signs or symptoms of bleeding such as spontaneous epistaxis, hematuria or hematochezia.   SUMMARY OF ONCOLOGIC HISTORY: Oncology History Overview Note  Neg genetics MSI stable MMR normal PD-L1 0%   Ovarian cancer on right ([place])  01/16/2019 Initial Diagnosis   She had vague symptom of dyspareunia. Presented to ER for evaluation in November for abdominal pain and vague  symptom of fullness and imaging showed abnormalities   04/16/2019 Imaging   1. Nonvisualization of the appendix. However, there is a large volume of ascites within the abdomen and pelvis which appears partially loculated. In the acute setting findings may be the sequelae of peritonitis. However, there are multiple partially calcified peritoneal nodules throughout the omentum which are concerning for peritoneal carcinomatosis. Noncalcified nodule in the left lower quadrant peritoneal  reflection is noted. Further investigation with diagnostic paracentesis is advised. 2. Ill-defined low-density mass within the right iliac fossa is suspected and may represent an ovarian neoplasm. 3. Aortic atherosclerosis. 4. Small bilateral pleural effusions. 5. Hiatal hernia. 6. Prior granulomatous disease.   Aortic Atherosclerosis (ICD10-I70.0).   04/17/2019 Tumor Marker   Patient's tumor was tested for the following markers: CA-125 Results of the tumor marker test revealed 288.   04/18/2019 Procedure   Successful ultrasound-guided diagnostic and therapeutic paracentesis yielding 3.2 liters of peritoneal fluid.   04/18/2019 Pathology Results   FINAL MICROSCOPIC DIAGNOSIS:  - Malignant cells present  - See comment   SPECIMEN ADEQUACY:  Satisfactory for evaluation   DIAGNOSTIC COMMENTS:  The cell block has scattered malignant cells.  These atypical cells are positive for cytokeraitn 7, MOC-31, Pax8, and WT-1 but negative for TTF-1 and cytokeratin 20.  Overall, the features are consistent with a primary gynecologic neoplasm   04/20/2019 Cancer Staging   Staging form: Ovary, Fallopian Tube, and Primary Peritoneal Carcinoma, AJCC 8th Edition - Clinical stage from 04/20/2019: FIGO Stage IIIC (cT3c, cN0, cM0) - Signed by Artis Delay, MD on 04/20/2019   04/25/2019 Procedure   Successful ultrasound-guided therapeutic paracentesis yielding 2.5 liters of peritoneal fluid.     04/27/2019 - 09/10/2019 Chemotherapy   The patient had carboplatin and taxol x 3 cycles for neoadjuvant chemotherapy treatment, followed by interval debulking surgery and 3 more cycles of chemotherapy     05/17/2019 Procedure   Placement of a subcutaneous port device. Catheter tip at the SVC and right atrium junction.   06/11/2019 Tumor Marker   Patient's tumor was tested for the following markers: CA-125 Results of the tumor marker test revealed 20.8   06/18/2019 Imaging   CT chest, abdomen and pelvis 1. Positive  response to therapy. Complex 6.6 cm cystic right adnexal mass is decreased in size. Omental nodularity has largely resolved. Ascites has resolved. No adenopathy. 2. No evidence of metastatic disease in the chest. 3. Chronic findings include: Aortic Atherosclerosis (ICD10-I70.0). Small hiatal hernia. Mild sigmoid diverticulosis.   07/03/2019 Surgery   Preoperative Diagnosis: ovarian cancer stage IIIC     Procedure(s) Performed: Robotic-assisted laparoscopic bilateral salpingo-oophorectomy, omentectomy, radical tumor debulking with mini-laparotomy. Optimal cytoreduction with no gross residual disease remaining.    Surgeon: Adolphus Birchwood, M.D. Specimens: Bilateral ovaries, fallopian tubes, omentum    Indication for Procedure:  Patient has a history of stage IIIC ovarian cancer, s/p 3 cycles of neoadjuvant chemotherapy with good partial clinical response. Right ovarian mass on imaging and omental caking.   Operative Findings: 6-8cm right ovarian mass densely adherent to pelvic sidewall with filmy adhesions to colonic epiploica but not bowel wall. Normal appendix. Slightly enlarged left tube and ovary but without discrete mass. Omental nodularity throughout infracolic omentum. Tiny miliary <32mm nodules on left diaphragm.    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) Histologic Grade: High-grade Other Tissue/ Organ Involvement: Left ovary and omentum Largest Extrapelvic Peritoneal Focus: 4.3 cm tumor deposit in the omentum Peritoneal/Ascitic Fluid: Positive for carcinoma ([id number]) Treatment Effect: Moderate response identified  (CRS 2) Regional Lymph Nodes: No lymph nodes submitted or found Pathologic Stage Classification (pTNM, AJCC 8th Edition): pT3c, pNx Representative Tumor Block: B10 Comment(s): Only the serous component shows metastasis to the omentum and spread to left ovary and right fallopian tube   07/27/2019 Tumor Marker   Patient's tumor was tested for the following markers: CA-125 Results of the tumor marker test revealed 13.7   08/14/2019 Genetic Testing   Negative germline + somatic testing. No pathogenic variants identified on the [employer]. VUS in MRE11A identified in the germline.  The report date is 08/14/2019.   The CancerNext gene panel offered by [employer] includes sequencing and rearrangement analysis for the following 36 genes: APC*, ATM*, AXIN2, BARD1, BMPR1A, BRCA1*, BRCA2*, BRIP1*, CDH1*, CDK4, CDKN2A, CHEK2*, DICER1, MLH1*, MSH2*, MSH3, MSH6*, MUTYH*, NBN, NF1*, NTHL1, PALB2*, PMS2*, PTEN*, RAD51C*, RAD51D*, RECQL, SMAD4, SMARCA4, STK11 and TP53* (sequencing and deletion/duplication); HOXB13, POLD1 and POLE (sequencing only); EPCAM and GREM1 (deletion/duplication only).   Somatic genes analyzed through TumorNext-HR[facility]: ATM, BARD1, BRCA1, BRCA2, BRIP1, CHEK2, MRE11A, NBN, PALB2, RAD51C, RAD51D.     09/10/2019 Tumor Marker   Patient's tumor was tested for the following markers: CA-125 Results of the tumor marker test revealed 9.5   10/10/2019 Imaging   1. No findings suspicious for recurrent metastatic disease in the abdomen or pelvis. No adenopathy. Minimal smooth peritoneal thickening in the paracolic gutters and deep pelvis, nonspecific, with no discrete peritoneal implants. No ascites. Continued CT surveillance warranted. 2. Chronic findings include: Aortic Atherosclerosis (ICD10-I70.0). Small hiatal hernia. Minimal sigmoid diverticulosis.     10/10/2019 Tumor Marker   Patient's tumor was tested for the following markers: CA-125 Results of the tumor marker  test revealed 8.9   02/18/2020 Tumor Marker   Patient's tumor was tested for the following markers: CA-125. Results of the tumor marker test revealed 10.9   02/18/2020 Imaging   No acute abnormality noted.     02/21/2020 Imaging   1. Complex cystic mass versus less likely complex abscess along the vaginal cuff and extending anterior to the rectum and along the right lateral side of the rectosigmoid junction, with components of the process extending up towards the adnexa, right greater than left. This measures up to 10.9 cm in diameter. If this represents an abscess I would expect the patient have striking symptoms (fever, pain, etc.). Given the patient's history, the appearance tends to favor recurrent ovarian malignancy over abscess. 2. Mild right hydronephrosis and hydroureter extending down to the level of the recurrent pelvic mass, indicating some low-grade obstruction. 3. There is sigmoid colon diverticulosis along with some stranding and wall indistinctness of the sigmoid colon which could reflect diverticulitis or colitis. Assessment is made more complex by the proximity of the pelvic mass. 4. Prominence of stool proximal to the sigmoid colon possibly from constipation or low-grade partial obstruction. 5. Other imaging findings of potential clinical significance: Small type 1 hiatal hernia. Old granulomatous disease. Lumbar spondylosis and degenerative disc disease causing impingement at L3-4, L4-5, and L5-S1. Mild rectus diastasis with transverse colon and adipose tissue extending along the diastatic region. 6. Aortic atherosclerosis.   02/27/2020 Echocardiogram   1. Left ventricular ejection fraction, by estimation, is 60 to 65%. The left  ventricle has normal function. The left ventricle has no regional wall motion abnormalities. Left ventricular diastolic parameters are consistent with Grade I diastolic dysfunction (impaired relaxation). The average left ventricular global longitudinal  strain is -18.3 %.  2. Right ventricular systolic function is normal. The right ventricular size is normal. There is normal pulmonary artery systolic pressure. The estimated right ventricular systolic pressure is 46.2 mmHg.  3. The mitral valve is normal in structure. Trivial mitral valve regurgitation.  4. The aortic valve is tricuspid. Aortic valve regurgitation is mild. No aortic stenosis is present.  5. The inferior vena cava is normal in size with greater than 50% respiratory variability, suggesting right atrial pressure of 3 mmHg.   03/02/2020 - 03/06/2020 Hospital Admission   She was admitted for management of bowel obstruction   03/04/2020 Pathology Results   High grade carcinoma consistent with high grade serous carcinoma.   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   Surgeon: Thereasa Solo, MD.    Operative Findings: a  15cm fixed pelvic mass filling the pelvis and obstructing the sigmoid colon. Moderately distended transverse colon. Normal caliber small intestine. Miliary studding of diaphragm with tumor. No other significant intraperitoneal disease. Ventral incisional hernia in the site of the prior midline vertical epigastric incision.   03/11/2020 -  Chemotherapy   The patient had DOXOrubicin for chemotherapy treatment.     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       REVIEW OF SYSTEMS:    Constitutional: Denies fevers, chills or abnormal weight loss Eyes: Denies blurriness of vision Ears, nose, mouth, throat, and face: Denies mucositis or sore throat Respiratory: Denies cough, dyspnea or wheezes Cardiovascular: Denies palpitation, chest discomfort or lower extremity swelling Gastrointestinal:  Denies nausea, heartburn or change in bowel habits Skin: Denies abnormal skin rashes Lymphatics: Denies new lymphadenopathy or easy bruising Neurological:Denies numbness, tingling or new weaknesses Behavioral/Psych: Mood is stable, no new changes  All other systems were reviewed with the patient and are negative.  I have reviewed the past medical history, past surgical history, social history and family history with the patient and they are unchanged from previous note.  ALLERGIES:  is allergic to bee venom, amlodipine, demerol [meperidine hcl], lisinopril, losartan, and other.  MEDICATIONS:  Current Outpatient Medications  Medication Sig Dispense Refill  . ALPRAZolam (XANAX) 0.25 MG tablet Take 0.25 mg by mouth daily as needed for sleep.    . Cholecalciferol (VITAMIN D-3 PO) Take 4,000 Units by mouth daily.     Marland Kitchen enoxaparin (LOVENOX) 40 MG/0.4ML injection Inject 0.4 mLs (40 mg total) into the skin daily for 26 doses. 10.4 mL 0  . estradiol (ESTRACE VAGINAL) 0.1 MG/GM vaginal cream Place 1 Applicatorful vaginally 3 (three) times a week. (Patient taking differently: Place 1 Applicatorful vaginally as needed. ) 42.5 g 12  . ferrous gluconate (FERGON) 324 MG tablet Take 1 tablet (324 mg total) by mouth daily with breakfast. (Patient not taking: Reported on 03/10/2020) 30 tablet 1  . FLUoxetine (  PROZAC) 20 MG capsule Take 1 capsule (20 mg total) by mouth daily. 90 capsule 3  . lidocaine-prilocaine (EMLA) cream Apply to affected area once (Patient taking differently: Apply 1 application topically daily as needed (port access). ) 30 g 3  . loratadine (CLARITIN) 10 MG tablet Take 10 mg by  mouth daily as needed for allergies.    Marland Kitchen morphine (MS CONTIN) 30 MG 12 hr tablet Take 1 tablet (30 mg total) by mouth every 8 (eight) hours. 90 tablet 0  . morphine (MSIR) 30 MG tablet Take 1 tablet (30 mg total) by mouth every 4 (four) hours as needed for severe pain. 60 tablet 0  . ondansetron (ZOFRAN) 8 MG tablet Take 8 mg by mouth every 8 (eight) hours as needed for nausea or vomiting.     . polyethylene glycol (MIRALAX / GLYCOLAX) 17 g packet Take 17 g by mouth 2 (two) times daily.    . prochlorperazine (COMPAZINE) 10 MG tablet Take 10 mg by mouth every 6 (six) hours as needed for nausea or vomiting.     . promethazine (PHENERGAN) 25 MG tablet Take 1 tablet (25 mg total) by mouth every 6 (six) hours as needed for nausea or vomiting. 30 tablet 0  . simvastatin (ZOCOR) 40 MG tablet Take 1 tablet (40 mg total) by mouth at bedtime. 90 tablet 4   No current facility-administered medications for this visit.    PHYSICAL EXAMINATION: ECOG PERFORMANCE STATUS: 2 - Symptomatic, <50% confined to bed  Vitals:   03/18/20 1246  BP: 127/69  Pulse: (!) 104  Resp: 19  Temp: (!) 96.6 F (35.9 C)  SpO2: 100%   Filed Weights   03/18/20 1246  Weight: 191 lb (86.6 kg)    GENERAL:alert, no distress and comfortable. She looks pale and weak SKIN: skin color, texture, turgor are normal, no rashes or significant lesions EYES: normal, Conjunctiva are pink and non-injected, sclera clear OROPHARYNX:no exudate, no erythema and lips, buccal mucosa, and tongue normal  NECK: supple, thyroid normal size, non-tender, without nodularity LYMPH:  no palpable lymphadenopathy in the cervical, axillary or inguinal LUNGS: clear to auscultation and percussion with normal breathing effort HEART: Mild tachycardia, no murmurs, no lower extremity edema ABDOMEN:abdomen soft, non-tender and normal bowel sounds Musculoskeletal:no cyanosis of digits and no clubbing  NEURO: alert & oriented x 3 with fluent speech, no focal  motor/sensory deficits  LABORATORY DATA:  I have reviewed the data as listed    Component Value Date/Time   NA 133 (L) 03/18/2020 1235   NA 137 10/27/2011 0738   K 4.0 03/18/2020 1235   K 4.1 10/27/2011 0738   CL 97 (L) 03/18/2020 1235   CL 105 10/27/2011 0738   CO2 27 03/18/2020 1235   CO2 26 10/27/2011 0738   GLUCOSE 111 (H) 03/18/2020 1235   GLUCOSE 111 (H) 10/27/2011 0738   BUN 13 03/18/2020 1235   BUN 15 10/27/2011 0738   CREATININE 0.98 03/18/2020 1235   CREATININE 1.01 (H) 12/16/2015 1030   CALCIUM 9.3 03/18/2020 1235   CALCIUM 10.0 09/28/2013 0000   PROT 6.7 03/18/2020 1235   PROT 8.1 10/27/2011 0738   ALBUMIN 2.7 (L) 03/18/2020 1235   ALBUMIN 3.9 10/27/2011 0738   AST 21 03/18/2020 1235   ALT 10 03/18/2020 1235   ALT 29 10/27/2011 0738   ALKPHOS 68 03/18/2020 1235   ALKPHOS 102 10/27/2011 0738   BILITOT 0.6 03/18/2020 1235   GFRNONAA >60 03/18/2020 1235   GFRNONAA 59 (L)  12/16/2015 1030   GFRAA >60 02/18/2020 1306   GFRAA 68 12/16/2015 1030    No results found for: SPEP, UPEP  Lab Results  Component Value Date   WBC 2.9 (L) 03/18/2020   NEUTROABS 2.3 03/18/2020   HGB 6.8 (LL) 03/18/2020   HCT 20.7 (L) 03/18/2020   MCV 90.8 03/18/2020   PLT 214 03/18/2020      Chemistry      Component Value Date/Time   NA 133 (L) 03/18/2020 1235   NA 137 10/27/2011 0738   K 4.0 03/18/2020 1235   K 4.1 10/27/2011 0738   CL 97 (L) 03/18/2020 1235   CL 105 10/27/2011 0738   CO2 27 03/18/2020 1235   CO2 26 10/27/2011 0738   BUN 13 03/18/2020 1235   BUN 15 10/27/2011 0738   CREATININE 0.98 03/18/2020 1235   CREATININE 1.01 (H) 12/16/2015 1030   GLU 98 09/28/2013 0000      Component Value Date/Time   CALCIUM 9.3 03/18/2020 1235   CALCIUM 10.0 09/28/2013 0000   ALKPHOS 68 03/18/2020 1235   ALKPHOS 102 10/27/2011 0738   AST 21 03/18/2020 1235   ALT 10 03/18/2020 1235   ALT 29 10/27/2011 0738   BILITOT 0.6 03/18/2020 1235

## 2020-03-19 NOTE — Assessment & Plan Note (Signed)
She is noted to have progressive moderate protein calorie malnutrition She will try her best to keep up with her nutritional intake She has no clinical signs or symptoms of bowel obstruction in her colostomy appears to be functioning well

## 2020-03-20 DIAGNOSIS — C561 Malignant neoplasm of right ovary: Secondary | ICD-10-CM | POA: Diagnosis not present

## 2020-03-21 DIAGNOSIS — D630 Anemia in neoplastic disease: Secondary | ICD-10-CM | POA: Diagnosis not present

## 2020-03-21 DIAGNOSIS — I10 Essential (primary) hypertension: Secondary | ICD-10-CM | POA: Diagnosis not present

## 2020-03-21 DIAGNOSIS — F419 Anxiety disorder, unspecified: Secondary | ICD-10-CM | POA: Diagnosis not present

## 2020-03-21 DIAGNOSIS — G893 Neoplasm related pain (acute) (chronic): Secondary | ICD-10-CM | POA: Diagnosis not present

## 2020-03-21 DIAGNOSIS — K5900 Constipation, unspecified: Secondary | ICD-10-CM | POA: Diagnosis not present

## 2020-03-21 DIAGNOSIS — C561 Malignant neoplasm of right ovary: Secondary | ICD-10-CM | POA: Diagnosis not present

## 2020-03-21 DIAGNOSIS — Z433 Encounter for attention to colostomy: Secondary | ICD-10-CM | POA: Diagnosis not present

## 2020-03-21 DIAGNOSIS — Z48815 Encounter for surgical aftercare following surgery on the digestive system: Secondary | ICD-10-CM | POA: Diagnosis not present

## 2020-03-21 DIAGNOSIS — F32A Depression, unspecified: Secondary | ICD-10-CM | POA: Diagnosis not present

## 2020-03-25 ENCOUNTER — Other Ambulatory Visit: Payer: Self-pay | Admitting: Hematology and Oncology

## 2020-03-25 ENCOUNTER — Other Ambulatory Visit: Payer: Self-pay

## 2020-03-25 ENCOUNTER — Encounter: Payer: Self-pay | Admitting: Hematology and Oncology

## 2020-03-25 ENCOUNTER — Inpatient Hospital Stay (HOSPITAL_BASED_OUTPATIENT_CLINIC_OR_DEPARTMENT_OTHER): Payer: Medicare HMO | Admitting: Hematology and Oncology

## 2020-03-25 ENCOUNTER — Telehealth: Payer: Self-pay | Admitting: Hematology and Oncology

## 2020-03-25 ENCOUNTER — Inpatient Hospital Stay: Payer: Medicare HMO

## 2020-03-25 VITALS — BP 132/72 | HR 108 | Temp 97.7°F | Resp 20 | Wt 184.6 lb

## 2020-03-25 DIAGNOSIS — C495 Malignant neoplasm of connective and soft tissue of pelvis: Secondary | ICD-10-CM

## 2020-03-25 DIAGNOSIS — D6481 Anemia due to antineoplastic chemotherapy: Secondary | ICD-10-CM | POA: Diagnosis not present

## 2020-03-25 DIAGNOSIS — C786 Secondary malignant neoplasm of retroperitoneum and peritoneum: Secondary | ICD-10-CM | POA: Diagnosis not present

## 2020-03-25 DIAGNOSIS — C561 Malignant neoplasm of right ovary: Secondary | ICD-10-CM

## 2020-03-25 DIAGNOSIS — C763 Malignant neoplasm of pelvis: Secondary | ICD-10-CM | POA: Diagnosis not present

## 2020-03-25 DIAGNOSIS — D539 Nutritional anemia, unspecified: Secondary | ICD-10-CM

## 2020-03-25 DIAGNOSIS — T451X5A Adverse effect of antineoplastic and immunosuppressive drugs, initial encounter: Secondary | ICD-10-CM

## 2020-03-25 DIAGNOSIS — K449 Diaphragmatic hernia without obstruction or gangrene: Secondary | ICD-10-CM | POA: Diagnosis not present

## 2020-03-25 DIAGNOSIS — K1231 Oral mucositis (ulcerative) due to antineoplastic therapy: Secondary | ICD-10-CM

## 2020-03-25 DIAGNOSIS — E440 Moderate protein-calorie malnutrition: Secondary | ICD-10-CM | POA: Diagnosis not present

## 2020-03-25 DIAGNOSIS — G893 Neoplasm related pain (acute) (chronic): Secondary | ICD-10-CM

## 2020-03-25 LAB — CBC WITH DIFFERENTIAL (CANCER CENTER ONLY)
Abs Immature Granulocytes: 0.15 10*3/uL — ABNORMAL HIGH (ref 0.00–0.07)
Basophils Absolute: 0 10*3/uL (ref 0.0–0.1)
Basophils Relative: 0 %
Eosinophils Absolute: 0 10*3/uL (ref 0.0–0.5)
Eosinophils Relative: 0 %
HCT: 23.9 % — ABNORMAL LOW (ref 36.0–46.0)
Hemoglobin: 8 g/dL — ABNORMAL LOW (ref 12.0–15.0)
Immature Granulocytes: 6 %
Lymphocytes Relative: 34 %
Lymphs Abs: 0.9 10*3/uL (ref 0.7–4.0)
MCH: 30.2 pg (ref 26.0–34.0)
MCHC: 33.5 g/dL (ref 30.0–36.0)
MCV: 90.2 fL (ref 80.0–100.0)
Monocytes Absolute: 1.2 10*3/uL — ABNORMAL HIGH (ref 0.1–1.0)
Monocytes Relative: 43 %
Neutro Abs: 0.5 10*3/uL — ABNORMAL LOW (ref 1.7–7.7)
Neutrophils Relative %: 17 %
Platelet Count: 350 10*3/uL (ref 150–400)
RBC: 2.65 MIL/uL — ABNORMAL LOW (ref 3.87–5.11)
RDW: 13.3 % (ref 11.5–15.5)
WBC Count: 2.7 10*3/uL — ABNORMAL LOW (ref 4.0–10.5)
nRBC: 1.1 % — ABNORMAL HIGH (ref 0.0–0.2)

## 2020-03-25 LAB — CMP (CANCER CENTER ONLY)
ALT: 7 U/L (ref 0–44)
AST: 19 U/L (ref 15–41)
Albumin: 2.8 g/dL — ABNORMAL LOW (ref 3.5–5.0)
Alkaline Phosphatase: 67 U/L (ref 38–126)
Anion gap: 9 (ref 5–15)
BUN: 6 mg/dL — ABNORMAL LOW (ref 8–23)
CO2: 28 mmol/L (ref 22–32)
Calcium: 9.4 mg/dL (ref 8.9–10.3)
Chloride: 97 mmol/L — ABNORMAL LOW (ref 98–111)
Creatinine: 0.91 mg/dL (ref 0.44–1.00)
GFR, Estimated: >60 mL/min (ref >60)
Glucose, Bld: 112 mg/dL — ABNORMAL HIGH (ref 70–99)
Potassium: 3.6 mmol/L (ref 3.5–5.1)
Sodium: 134 mmol/L — ABNORMAL LOW (ref 135–145)
Total Bilirubin: 0.4 mg/dL (ref 0.3–1.2)
Total Protein: 6.8 g/dL (ref 6.5–8.1)

## 2020-03-25 LAB — SAMPLE TO BLOOD BANK

## 2020-03-25 LAB — PREPARE RBC (CROSSMATCH)

## 2020-03-25 MED ORDER — ACETAMINOPHEN 325 MG PO TABS
650.0000 mg | ORAL_TABLET | Freq: Once | ORAL | Status: AC
  Administered 2020-03-25: 650 mg via ORAL

## 2020-03-25 MED ORDER — DIPHENHYDRAMINE HCL 25 MG PO CAPS
ORAL_CAPSULE | ORAL | Status: AC
  Filled 2020-03-25: qty 1

## 2020-03-25 MED ORDER — HEPARIN SOD (PORK) LOCK FLUSH 100 UNIT/ML IV SOLN
250.0000 [IU] | INTRAVENOUS | Status: AC | PRN
  Administered 2020-03-25: 250 [IU]
  Filled 2020-03-25: qty 5

## 2020-03-25 MED ORDER — ACETAMINOPHEN 325 MG PO TABS
ORAL_TABLET | ORAL | Status: AC
  Filled 2020-03-25: qty 2

## 2020-03-25 MED ORDER — MAGIC MOUTHWASH W/LIDOCAINE: 5.0000 mL | Freq: Four times a day (QID) | ORAL | 0 refills | Status: AC

## 2020-03-25 MED ORDER — SODIUM CHLORIDE 0.9% FLUSH
10.0000 mL | Freq: Once | INTRAVENOUS | Status: AC
  Administered 2020-03-25: 10 mL
  Filled 2020-03-25: qty 10

## 2020-03-25 MED ORDER — SODIUM CHLORIDE 0.9% FLUSH
10.0000 mL | INTRAVENOUS | Status: AC | PRN
  Administered 2020-03-25: 10 mL
  Filled 2020-03-25: qty 10

## 2020-03-25 MED ORDER — DIPHENHYDRAMINE HCL 25 MG PO CAPS
25.0000 mg | ORAL_CAPSULE | Freq: Once | ORAL | Status: AC
  Administered 2020-03-25: 25 mg via ORAL

## 2020-03-25 MED ORDER — SODIUM CHLORIDE 0.9% IV SOLUTION
250.0000 mL | Freq: Once | INTRAVENOUS | Status: AC
  Administered 2020-03-25: 250 mL via INTRAVENOUS
  Filled 2020-03-25: qty 250

## 2020-03-25 MED ORDER — PROMETHAZINE HCL 25 MG PO TABS: 25.0000 mg | ORAL_TABLET | Freq: Four times a day (QID) | ORAL | 11 refills | Status: DC | PRN

## 2020-03-25 MED ORDER — SODIUM CHLORIDE 0.9% FLUSH
3.0000 mL | INTRAVENOUS | Status: DC | PRN
  Filled 2020-03-25: qty 10

## 2020-03-25 MED FILL — CMPD MMW LID:NYS:DPH:MAX: 12 days supply | Qty: 240 | Fill #0

## 2020-03-25 MED FILL — PROMETHAZINE 25 MG TABLET: 25 | 15 days supply | Qty: 60 | Fill #0

## 2020-03-25 NOTE — Telephone Encounter (Signed)
Scheduled appointments per 11/9 sch msg. Spoke to patient who is aware of appointments dates and times. Gave patient calendar print out.

## 2020-03-25 NOTE — Assessment & Plan Note (Signed)
She is symptomatic We will call in Magic mouthwash with I will reassess next week I plan minor dose adjustment or future treatment to minimize side effects

## 2020-03-25 NOTE — Assessment & Plan Note (Signed)
We discussed some of the risks, benefits, and alternatives of blood transfusions. The patient is symptomatic from anemia and the hemoglobin level is critically low.  Some of the side-effects to be expected including risks of transfusion reactions, chills, infection, syndrome of volume overload and risk of hospitalization from various reasons and the patient is willing to proceed and went ahead to sign consent today. She is symptomatic I will order 1 unit of blood transfusion

## 2020-03-25 NOTE — Patient Instructions (Signed)

## 2020-03-25 NOTE — Progress Notes (Signed)
[facility] OFFICE PROGRESS NOTE  Patient Care Team: Connie Quitter, MD as PCP - General (Internal Medicine) Connie Fusi Servando Snare, RN as Oncology Nurse Navigator (Oncology)  ASSESSMENT & PLAN:  Ovarian cancer on right [facility]) Overall, she tolerated last cycle of therapy well except for mucositis, nausea and severe anemia I will see her again next week for further follow-up I plan for minor dose adjustment of doxorubicin next cycle to minimize some of the side effects I reviewed test results of her recent foundation 1 studies Unfortunately, we do not find any actionable mutation  Anemia due to antineoplastic chemotherapy We discussed some of the risks, benefits, and alternatives of blood transfusions. The patient is symptomatic from anemia and the hemoglobin level is critically low.  Some of the side-effects to be expected including risks of transfusion reactions, chills, infection, syndrome of volume overload and risk of hospitalization from various reasons and the patient is willing to proceed and went ahead to sign consent today. She is symptomatic I will order 1 unit of blood transfusion  Cancer associated pain Her cancer pain is better controlled She will continue long-acting pain medicine with breakthrough pain medicine as needed  Mucositis due to antineoplastic therapy She is symptomatic We will call in Magic mouthwash with I will reassess next week I plan minor dose adjustment or future treatment to minimize side effects   Orders Placed This Encounter  Procedures  . Informed Consent Details: Physician/Practitioner Attestation; Transcribe to consent form and obtain patient signature    Standing Status:   Future    Standing Expiration Date:   03/25/2021    Order Specific Question:   Physician/Practitioner attestation of informed consent for blood and or blood product transfusion    Answer:   I, the physician/practitioner, attest that I have discussed with the patient the  benefits, risks, side effects, alternatives, likelihood of achieving goals and potential problems during recovery for the procedure that I have provided informed consent.    Order Specific Question:   Product(s)    Answer:   All Product(s)  . Care order/instruction    Transfuse Parameters    Standing Status:   Future    Standing Expiration Date:   03/25/2021  . Type and screen    Standing Status:   Future    Number of Occurrences:   1    Standing Expiration Date:   03/25/2021  . Prepare RBC (crossmatch)    Standing Status:   Standing    Number of Occurrences:   1    Order Specific Question:   # of Units    Answer:   1 unit    Order Specific Question:   Transfusion Indications    Answer:   Symptomatic Anemia    Order Specific Question:   Special Requirements    Answer:   Irradiated    Order Specific Question:   Number of Units to Keep Ahead    Answer:   NO units ahead    Order Specific Question:   Instructions:    Answer:   Transfuse    Order Specific Question:   If emergent release call blood bank    Answer:   Not emergent release    All questions were answered. The patient knows to call the clinic with any problems, questions or concerns. The total time spent in the appointment was 30 minutes encounter with patients including review of chart and various tests results, discussions about plan of care and coordination of  care plan   Connie Lark, MD 03/25/2020 11:02 AM  INTERVAL HISTORY: Please see below for problem oriented charting. She returns for further follow-up She developed some mucositis since last time I saw her Had some mild nausea Her pain is well controlled No recent bleeding complications Denies dizziness but she is somewhat weak  SUMMARY OF ONCOLOGIC HISTORY: Oncology History Overview Note  Neg genetics MSI stable MMR normal PD-L1 0% [facility]: no actionable mutations   Ovarian cancer on right ([place])  01/16/2019 Initial Diagnosis   She had vague symptom  of dyspareunia. Presented to ER for evaluation in November for abdominal pain and vague symptom of fullness and imaging showed abnormalities   04/16/2019 Imaging   1. Nonvisualization of the appendix. However, there is a large volume of ascites within the abdomen and pelvis which appears partially loculated. In the acute setting findings may be the sequelae of peritonitis. However, there are multiple partially calcified peritoneal nodules throughout the omentum which are concerning for peritoneal carcinomatosis. Noncalcified nodule in the left lower quadrant peritoneal reflection is noted. Further investigation with diagnostic paracentesis is advised. 2. Ill-defined low-density mass within the right iliac fossa is suspected and may represent an ovarian neoplasm. 3. Aortic atherosclerosis. 4. Small bilateral pleural effusions. 5. Hiatal hernia. 6. Prior granulomatous disease.   Aortic Atherosclerosis (ICD10-I70.0).   04/17/2019 Tumor Marker   Patient's tumor was tested for the following markers: CA-125 Results of the tumor marker test revealed 288.   04/18/2019 Procedure   Successful ultrasound-guided diagnostic and therapeutic paracentesis yielding 3.2 liters of peritoneal fluid.   04/18/2019 Pathology Results   FINAL MICROSCOPIC DIAGNOSIS:  - Malignant cells present  - See comment   SPECIMEN ADEQUACY:  Satisfactory for evaluation   DIAGNOSTIC COMMENTS:  The cell block has scattered malignant cells.  These atypical cells are positive for cytokeraitn 7, MOC-31, Pax8, and WT-1 but negative for TTF-1 and cytokeratin 20.  Overall, the features are consistent with a primary gynecologic neoplasm   04/20/2019 Cancer Staging   Staging form: Ovary, Fallopian Tube, and Primary Peritoneal Carcinoma, AJCC 8th Edition - Clinical stage from 04/20/2019: FIGO Stage IIIC (cT3c, cN0, cM0) - Signed by Connie Lark, MD on 04/20/2019   04/25/2019 Procedure   Successful ultrasound-guided therapeutic  paracentesis yielding 2.5 liters of peritoneal fluid.     04/27/2019 - 09/10/2019 Chemotherapy   The patient had carboplatin and taxol x 3 cycles for neoadjuvant chemotherapy treatment, followed by interval debulking surgery and 3 more cycles of chemotherapy     05/17/2019 Procedure   Placement of a subcutaneous port device. Catheter tip at the SVC and right atrium junction.   06/11/2019 Tumor Marker   Patient's tumor was tested for the following markers: CA-125 Results of the tumor marker test revealed 20.8   06/18/2019 Imaging   CT chest, abdomen and pelvis 1. Positive response to therapy. Complex 6.6 cm cystic right adnexal mass is decreased in size. Omental nodularity has largely resolved. Ascites has resolved. No adenopathy. 2. No evidence of metastatic disease in the chest. 3. Chronic findings include: Aortic Atherosclerosis (ICD10-I70.0). Small hiatal hernia. Mild sigmoid diverticulosis.   07/03/2019 Surgery   Preoperative Diagnosis: ovarian cancer stage IIIC     Procedure(s) Performed: Robotic-assisted laparoscopic bilateral salpingo-oophorectomy, omentectomy, radical tumor debulking with mini-laparotomy. Optimal cytoreduction with no gross residual disease remaining.    Surgeon: Everitt Amber, M.D. Specimens: Bilateral ovaries, fallopian tubes, omentum    Indication for Procedure:  Patient has a history of stage IIIC ovarian cancer,  s/p 3 cycles of neoadjuvant chemotherapy with good partial clinical response. Right ovarian mass on imaging and omental caking.   Operative Findings: 6-8cm right ovarian mass densely adherent to pelvic sidewall with filmy adhesions to colonic epiploica but not bowel wall. Normal appendix. Slightly enlarged left tube and ovary but without discrete mass. Omental nodularity throughout infracolic omentum. Tiny miliary <106m nodules on left diaphragm.    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) Histologic Grade: High-grade Other Tissue/ Organ Involvement: Left ovary and omentum Largest Extrapelvic Peritoneal Focus: 4.3 cm tumor deposit in the omentum Peritoneal/Ascitic Fluid: Positive for carcinoma (([id number] Treatment Effect: Moderate response identified (CRS 2) Regional Lymph Nodes: No lymph nodes submitted or found Pathologic Stage Classification (pTNM, AJCC 8th Edition): pT3c, pNx Representative Tumor Block: B10 Comment(s): Only the serous component shows metastasis to the omentum and spread to left ovary and right fallopian tube   07/27/2019 Tumor Marker   Patient's tumor was tested for the following markers: CA-125 Results of the tumor marker test revealed 13.7   08/14/2019 Genetic Testing   Negative germline + somatic testing. No pathogenic variants identified on the AWm. [employer] VUS in MRE11A identified in the germline.  The report date is 08/14/2019.   The CancerNext gene panel offered by [employer] sequencing and rearrangement analysis for the following 36 genes: APC*, ATM*, AXIN2, BARD1, BMPR1A, BRCA1*, BRCA2*, BRIP1*, CDH1*, CDK4, CDKN2A, CHEK2*, DICER1, MLH1*, MSH2*, MSH3, MSH6*, MUTYH*, NBN, NF1*, NTHL1, PALB2*, PMS2*, PTEN*, RAD51C*, RAD51D*, RECQL, SMAD4, SMARCA4, STK11 and TP53* (sequencing and deletion/duplication); HOXB13, POLD1 and POLE (sequencing only); EPCAM and GREM1 (deletion/duplication only).   Somatic genes analyzed through TumorNext-HRD: ATM, BARD1, BRCA1, BRCA2, BRIP1, CHEK2, MRE11A, NBN, PALB2, RAD51C, RAD51D.     09/10/2019 Tumor Marker   Patient's tumor was tested for the following markers:  CA-125 Results of the tumor marker test revealed 9.5   10/10/2019 Imaging   1. No findings suspicious for recurrent metastatic disease in the abdomen or pelvis. No adenopathy. Minimal smooth peritoneal thickening in the paracolic gutters and deep pelvis, nonspecific, with no discrete peritoneal implants. No ascites. Continued CT surveillance warranted. 2. Chronic findings include: Aortic Atherosclerosis (ICD10-I70.0). Small hiatal hernia. Minimal sigmoid diverticulosis.     10/10/2019 Tumor Marker   Patient's tumor was tested for the following markers: CA-125 Results of the tumor marker test revealed 8.9   02/18/2020 Tumor Marker   Patient's tumor was tested for the following markers: CA-125. Results of the tumor marker test revealed 10.9   02/18/2020 Imaging   No acute abnormality noted.     02/21/2020 Imaging   1. Complex cystic mass versus less likely complex abscess along the vaginal cuff and extending anterior to the rectum and along the right lateral side of the rectosigmoid junction, with components of the process extending up towards the adnexa, right greater than left. This measures up to 10.9 cm in diameter. If this represents an abscess I would expect the patient have striking symptoms (fever, pain, etc.). Given the patient's history, the appearance tends to favor recurrent ovarian malignancy over abscess. 2. Mild right hydronephrosis and hydroureter extending down to the level of the recurrent pelvic mass, indicating some low-grade obstruction. 3. There is sigmoid colon diverticulosis along  with some stranding and wall indistinctness of the sigmoid colon which could reflect diverticulitis or colitis. Assessment is made more complex by the proximity of the pelvic mass. 4. Prominence of stool proximal to the sigmoid colon possibly from constipation or low-grade partial obstruction. 5. Other imaging findings of potential clinical significance: Small type 1 hiatal hernia. Old  granulomatous disease. Lumbar spondylosis and degenerative disc disease causing impingement at L3-4, L4-5, and L5-S1. Mild rectus diastasis with transverse colon and adipose tissue extending along the diastatic region. 6. Aortic atherosclerosis.   02/27/2020 Echocardiogram   1. Left ventricular ejection fraction, by estimation, is 60 to 65%. The left ventricle has normal function. The left ventricle has no regional wall motion abnormalities. Left ventricular diastolic parameters are consistent with Grade I diastolic dysfunction (impaired relaxation). The average left ventricular global longitudinal strain is -18.3 %.  2. Right ventricular systolic function is normal. The right ventricular size is normal. There is normal pulmonary artery systolic pressure. The estimated right ventricular systolic pressure is 98.1 mmHg.  3. The mitral valve is normal in structure. Trivial mitral valve regurgitation.  4. The aortic valve is tricuspid. Aortic valve regurgitation is mild. No aortic stenosis is present.  5. The inferior vena cava is normal in size with greater than 50% respiratory variability, suggesting right atrial pressure of 3 mmHg.   03/02/2020 - 03/06/2020 Hospital Admission   She was admitted for management of bowel obstruction   03/04/2020 Pathology Results   High grade carcinoma consistent with high grade serous carcinoma.   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   Surgeon: Thereasa Solo, MD.    Operative Findings: a  15cm fixed pelvic mass filling the pelvis and obstructing the sigmoid colon. Moderately distended transverse colon. Normal caliber small intestine. Miliary studding of diaphragm with  tumor. No other significant intraperitoneal disease. Ventral incisional hernia in the site of the prior midline vertical epigastric incision.   03/11/2020 -  Chemotherapy   The patient had DOXOrubicin for chemotherapy treatment.     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       REVIEW OF SYSTEMS:   Constitutional: Denies fevers, chills or abnormal weight loss Eyes: Denies blurriness of vision Respiratory: Denies cough, dyspnea or wheezes Cardiovascular: Denies palpitation, chest discomfort or lower extremity swelling Skin: Denies abnormal skin rashes Lymphatics: Denies new lymphadenopathy or easy bruising Neurological:Denies numbness, tingling or new weaknesses Behavioral/Psych: Mood is stable, no new changes  All other systems were reviewed with the patient and are negative.  I have reviewed the past medical history, past surgical history, social history and family history with the patient and they are unchanged from previous note.  ALLERGIES:  is allergic to bee venom, amlodipine, demerol [meperidine hcl], lisinopril, losartan, and other.  MEDICATIONS:  Current Outpatient Medications  Medication Sig Dispense Refill  . ALPRAZolam (XANAX) 0.25 MG tablet Take 0.25 mg by mouth daily as needed for sleep.    . Cholecalciferol (VITAMIN D-3 PO) Take 4,000 Units by mouth daily.     Marland Kitchen  enoxaparin (LOVENOX) 40 MG/0.4ML injection Inject 0.4 mLs (40 mg total) into the skin daily for 26 doses. 10.4 mL 0  . estradiol (ESTRACE VAGINAL) 0.1 MG/GM vaginal cream Place 1 Applicatorful vaginally 3 (three) times a week. (Patient taking differently: Place 1 Applicatorful vaginally as needed. ) 42.5 g 12  .  FLUoxetine (PROZAC) 20 MG capsule Take 1 capsule (20 mg total) by mouth daily. 90 capsule 3  . lidocaine-prilocaine (EMLA) cream Apply to affected area once (Patient taking differently: Apply 1 application topically daily as needed (port access). ) 30 g 3  . loratadine (CLARITIN) 10 MG tablet Take 10 mg by mouth daily as needed for allergies.    Marland Kitchen morphine (MS CONTIN) 30 MG 12 hr tablet Take 1 tablet (30 mg total) by mouth every 8 (eight) hours. 90 tablet 0  . morphine (MSIR) 30 MG tablet Take 1 tablet (30 mg total) by mouth every 4 (four) hours as needed for severe pain. 60 tablet 0  . ondansetron (ZOFRAN) 8 MG tablet Take 8 mg by mouth every 8 (eight) hours as needed for nausea or vomiting.     . polyethylene glycol (MIRALAX / GLYCOLAX) 17 g packet Take 17 g by mouth 2 (two) times daily.    . prochlorperazine (COMPAZINE) 10 MG tablet Take 10 mg by mouth every 6 (six) hours as needed for nausea or vomiting.     . promethazine (PHENERGAN) 25 MG tablet Take 1 tablet (25 mg total) by mouth every 6 (six) hours as needed for nausea or vomiting. 60 tablet 11  . simvastatin (ZOCOR) 40 MG tablet Take 1 tablet (40 mg total) by mouth at bedtime. 90 tablet 4   No current facility-administered medications for this visit.    PHYSICAL EXAMINATION: ECOG PERFORMANCE STATUS: 2 - Symptomatic, <50% confined to bed  Vitals:   03/25/20 1045  BP: 132/72  Pulse: (!) 108  Resp: 20  Temp: 97.7 F (36.5 C)  SpO2: 99%   Filed Weights   03/25/20 1045  Weight: 184 lb 9.6 oz (83.7 kg)    GENERAL:alert, no distress and comfortable SKIN: skin color, texture, turgor are normal, no rashes or significant lesions EYES: normal, Conjunctiva are pink and non-injected, sclera clear OROPHARYNX: Noted oral mucositis with thrush NECK: supple, thyroid normal size, non-tender, without nodularity LYMPH:  no palpable lymphadenopathy in the cervical, axillary or inguinal LUNGS: clear to auscultation and percussion with  normal breathing effort HEART: Tachycardia, no murmurs ABDOMEN:abdomen soft, non-tender and normal bowel sounds Musculoskeletal:no cyanosis of digits and no clubbing  NEURO: alert & oriented x 3 with fluent speech, no focal motor/sensory deficits  LABORATORY DATA:  I have reviewed the data as listed    Component Value Date/Time   NA 133 (L) 03/18/2020 1235   NA 137 10/27/2011 0738   K 4.0 03/18/2020 1235   K 4.1 10/27/2011 0738   CL 97 (L) 03/18/2020 1235   CL 105 10/27/2011 0738   CO2 27 03/18/2020 1235   CO2 26 10/27/2011 0738   GLUCOSE 111 (H) 03/18/2020 1235   GLUCOSE 111 (H) 10/27/2011 0738   BUN 13 03/18/2020 1235   BUN 15 10/27/2011 0738   CREATININE 0.98 03/18/2020 1235   CREATININE 1.01 (H) 12/16/2015 1030   CALCIUM 9.3 03/18/2020 1235   CALCIUM 10.0 09/28/2013 0000   PROT 6.7 03/18/2020 1235   PROT 8.1 10/27/2011 0738   ALBUMIN 2.7 (L) 03/18/2020 1235   ALBUMIN 3.9 10/27/2011 0738   AST 21 03/18/2020 1235  ALT 10 03/18/2020 1235   ALT 29 10/27/2011 0738   ALKPHOS 68 03/18/2020 1235   ALKPHOS 102 10/27/2011 0738   BILITOT 0.6 03/18/2020 1235   GFRNONAA >60 03/18/2020 1235   GFRNONAA 59 (L) 12/16/2015 1030   GFRAA >60 02/18/2020 1306   GFRAA 68 12/16/2015 1030    No results found for: SPEP, UPEP  Lab Results  Component Value Date   WBC 2.7 (L) 03/25/2020   NEUTROABS PENDING 03/25/2020   HGB 8.0 (L) 03/25/2020   HCT 23.9 (L) 03/25/2020   MCV 90.2 03/25/2020   PLT 350 03/25/2020      Chemistry      Component Value Date/Time   NA 133 (L) 03/18/2020 1235   NA 137 10/27/2011 0738   K 4.0 03/18/2020 1235   K 4.1 10/27/2011 0738   CL 97 (L) 03/18/2020 1235   CL 105 10/27/2011 0738   CO2 27 03/18/2020 1235   CO2 26 10/27/2011 0738   BUN 13 03/18/2020 1235   BUN 15 10/27/2011 0738   CREATININE 0.98 03/18/2020 1235   CREATININE 1.01 (H) 12/16/2015 1030   GLU 98 09/28/2013 0000      Component Value Date/Time   CALCIUM 9.3 03/18/2020 1235    CALCIUM 10.0 09/28/2013 0000   ALKPHOS 68 03/18/2020 1235   ALKPHOS 102 10/27/2011 0738   AST 21 03/18/2020 1235   ALT 10 03/18/2020 1235   ALT 29 10/27/2011 0738   BILITOT 0.6 03/18/2020 1235       RADIOGRAPHIC STUDIES: I have personally reviewed the radiological images as listed and agreed with the findings in the report. CT Abdomen Pelvis W Contrast  Result Date: 03/08/2020 CLINICAL DATA:  Recurrent ovarian carcinoma. Recent exploratory laparotomy with transverse loop colostomy. EXAM: CT ABDOMEN AND PELVIS WITH CONTRAST TECHNIQUE: Multidetector CT imaging of the abdomen and pelvis was performed using the standard protocol following bolus administration of intravenous contrast. CONTRAST:  171mL OMNIPAQUE IOHEXOL 300 MG/ML  SOLN COMPARISON:  CT scan 03/02/2020 FINDINGS: Lower chest: Trace pericardial effusions. Mild RIGHT basilar atelectasis. No pneumonia. Hepatobiliary: No focal hepatic lesion. Postcholecystectomy. No biliary dilatation. Pancreas: Pancreas is normal. No ductal dilatation. No pancreatic inflammation. Spleen: Normal spleen Adrenals/urinary tract: Adrenal glands normal. Persistent RIGHT hydronephrosis and hydroureter. Hydroureter to the level of the pelvic mass. Findings similar to CT 03/02/2020. Extrarenal pelvis on the LEFT. No ureteral dilatation. Bladder is compressed anteriorly by pelvic mass. Small a gas within the bladder related to recent surgery/catheterization. Stomach/Bowel: No gastric distension. The stomach and duodenum normal. Small bowel normal. Terminal ileum and cecum normal. Appendix not identified. Ascending colon normal. Transverse colon exits a LEFT midline ostomy without complication. Vascular/Lymphatic: Abdominal aorta is normal caliber. No periportal or retroperitoneal adenopathy. No pelvic adenopathy. Reproductive: Large multilobulated pelvic mass again demonstrated. Example lobular component measures 8.6 x 8.2 cm (image 74/2) compared to 7.0 by 7.5 cm. More  anterior lobular component measures 5.7 cm by 4.9 cm compared to 5.5 x 4.2 cm. In craniocaudad dimension cul-de-sac mass measures 11.8 cm compared to 9.6 cm. Other: Postsurgical change in the abdominal wall consistent laparoscopic surgery. No complicating features. Musculoskeletal: No aggressive osseous lesion. IMPRESSION: 1. No evidence of bowel obstruction.  Stomach small bowel normal. 2. Post laparoscopic transverse loop colostomy without complicating features. 3. Persistent moderate RIGHT ureteral obstruction. 4. Pelvic mass measures larger in short interval follow-up from 03/02/2020. Electronically Signed   By: Suzy Bouchard M.D.   On: 03/08/2020 15:20   CT Abdomen Pelvis W Contrast  Result Date: 03/02/2020 CLINICAL DATA:  Right lower quadrant abdominal pain and sensation of pelvic pressure. History of ovarian cancer. EXAM: CT ABDOMEN AND PELVIS WITH CONTRAST TECHNIQUE: Multidetector CT imaging of the abdomen and pelvis was performed using the standard protocol following bolus administration of intravenous contrast. CONTRAST:  171m OMNIPAQUE IOHEXOL 300 MG/ML  SOLN COMPARISON:  02/21/2020 FINDINGS: Lower chest: 4 mm calcified granuloma in the right lower lobe on image 2 of series 4. 3 mm pleural-based nodule along the right hemidiaphragm on image 7 of series 4 is stable. Other calcified granulomas are present at the right lung base. Mild linear scarring peripherally at the left lung base is unchanged. Left lower lobe granulomas are also present. Small type 1 hiatal hernia. 0.7 cm lymph node in the right pericardial space on image 11 of series 2, previously 0.5 cm. Hepatobiliary: Trace perihepatic ascites is new.  Cholecystectomy. Pancreas: Unremarkable Spleen: Old granulomatous disease. Adrenals/Urinary Tract: Both adrenal glands appear normal. Worsened right hydronephrosis and hydroureter extending down to a region of extrinsic compression on the ureter just past the iliac vessel cross over. Invasion  of the ureter by the pelvic mass is not excluded. The degree of hydronephrosis is now moderate to prominent, likely with some minimal delay in excretion contrast medium. Consider urology consultation for potential stenting or percutaneous nephrostomy. The left kidney appears normal. The urinary bladder is effaced by the pelvic mass. Stomach/Bowel: Small type 1 hiatal hernia. There is prominence of stool throughout the colon extending down to the sigmoid colon. The sigmoid colon itself is effaced by the large pelvic mass and flattened by the mass, and difficult to separate from the margins of the mass. Obstruction or invasion of the sigmoid colon by the mass is a distinct possibility. Vascular/Lymphatic: Aortoiliac atherosclerotic vascular disease. Portacaval node 1.2 cm in short axis on image 22 of series 2, previously 0.9 cm. Stable 0.7 cm left common iliac node on image 48 of series 2. Reproductive: Large multilobular pelvic mass or abscess is identified. The posterior portion measures 11.3 by 7.2 by 8.1 cm (volume = 350 cm^3) today and previously measured 9.4 by 6.0 by 7.0 cm (volume = 210 cm^3) ten days ago. The more anterior portion measures about 9.0 by 5.0 by 11.1 cm (volume = 260 cm^3) today and previously measured about 5.9 by 3.3 by 6.8 cm (volume = 69 cm^3) ten days ago. These collections appear continuous and sits just above the vaginal cuff. Other: As noted above, there is trace perihepatic ascites, increased from prior. Stable mild presacral edema. Musculoskeletal: Stable rectus diastasis with a small amount of the transverse colon bulging into the diastatic region. Stable lumbar spondylosis and degenerative disc disease. Right hip prosthesis noted. IMPRESSION: 1. Rapid enlargement of complex pelvic mass lesion over the last 10 days, causing increased obstruction of the adherent sigmoid colon and increased obstruction of the right ureter. Possibilities include rapidly growing tumor or abscess. It would  be somewhat unusual for abscess to cause this degree of ureteral and bowel obstruction. 2. Trace perihepatic ascites, increased from prior. 3. Other imaging findings of potential clinical significance: Small type 1 hiatal hernia. Stable rectus diastasis with a small amount of the transverse colon bulging into the diastatic region. Stable lumbar spondylosis and degenerative disc disease. 4. Aortic atherosclerosis. Aortic Atherosclerosis (ICD10-I70.0). Electronically Signed   By: WVan ClinesM.D.   On: 03/02/2020 12:55   ECHOCARDIOGRAM COMPLETE  Result Date: 02/27/2020    ECHOCARDIOGRAM REPORT   Patient Name:  Connie Connie Date of Exam: 02/27/2020 Medical Rec #:  [id number]        Height:       67.0 in Accession #:    [id number]       Weight:       187.2 lb Date of Birth:  1950/09/24       BSA:          1.966 m Patient Age:    31 years         BP:           162/82 mmHg Patient Gender: F                HR:           77 bpm. Exam Location:  Outpatient Procedure: 2D Echo, Color Doppler, Cardiac Doppler and Strain Analysis Indications:    Pre-Chemo Evaluation v58.11  History:        Patient has no prior history of Echocardiogram examinations.                 Risk Factors:Hypertension and Dyslipidemia.  Sonographer:    Raquel Sarna Senior RDCS Referring Phys: [phone] Lilienne Weins Coco  1. Left ventricular ejection fraction, by estimation, is 60 to 65%. The left ventricle has normal function. The left ventricle has no regional wall motion abnormalities. Left ventricular diastolic parameters are consistent with Grade I diastolic dysfunction (impaired relaxation). The average left ventricular global longitudinal strain is -18.3 %.  2. Right ventricular systolic function is normal. The right ventricular size is normal. There is normal pulmonary artery systolic pressure. The estimated right ventricular systolic pressure is 90.3 mmHg.  3. The mitral valve is normal in structure. Trivial mitral valve regurgitation.   4. The aortic valve is tricuspid. Aortic valve regurgitation is mild. No aortic stenosis is present.  5. The inferior vena cava is normal in size with greater than 50% respiratory variability, suggesting right atrial pressure of 3 mmHg. FINDINGS  Left Ventricle: Left ventricular ejection fraction, by estimation, is 60 to 65%. The left ventricle has normal function. The left ventricle has no regional wall motion abnormalities. The average left ventricular global longitudinal strain is -18.3 %. The left ventricular internal cavity size was normal in size. There is no left ventricular hypertrophy. Left ventricular diastolic parameters are consistent with Grade I diastolic dysfunction (impaired relaxation). Right Ventricle: The right ventricular size is normal. Right vetricular wall thickness was not assessed. Right ventricular systolic function is normal. There is normal pulmonary artery systolic pressure. The tricuspid regurgitant velocity is 2.46 m/s, and with an assumed right atrial pressure of 3 mmHg, the estimated right ventricular systolic pressure is 00.9 mmHg. Left Atrium: Left atrial size was normal in size. Right Atrium: Right atrial size was normal in size. Pericardium: Trivial pericardial effusion is present. Mitral Valve: The mitral valve is normal in structure. Trivial mitral valve regurgitation. Tricuspid Valve: The tricuspid valve is normal in structure. Tricuspid valve regurgitation is mild. Aortic Valve: The aortic valve is tricuspid. Aortic valve regurgitation is mild. No aortic stenosis is present. Pulmonic Valve: The pulmonic valve was not well visualized. Pulmonic valve regurgitation is trivial. Aorta: The aortic root and ascending aorta are structurally normal, with no evidence of dilitation. Venous: The inferior vena cava is normal in size with greater than 50% respiratory variability, suggesting right atrial pressure of 3 mmHg. IAS/Shunts: No atrial level shunt detected by color flow Doppler.   LEFT VENTRICLE PLAX 2D LVIDd:  4.40 cm  Diastology LVIDs:         2.50 cm  LV e' medial:    0.06 cm/s LV PW:         0.80 cm  LV E/e' medial:  11.2 LV IVS:        0.80 cm  LV e' lateral:   0.06 cm/s LVOT diam:     1.90 cm  LV E/e' lateral: 11.0 LV SV:         45 LV SV Index:   23       2D Longitudinal Strain LVOT Area:     2.84 cm 2D Strain GLS Avg:     -18.3 %  RIGHT VENTRICLE RV S prime:     14.80 cm/s TAPSE (M-mode): 1.8 cm LEFT ATRIUM             Index       RIGHT ATRIUM           Index LA diam:        3.20 cm 1.63 cm/m  RA Area:     11.20 cm LA Vol (A2C):   23.5 ml 11.95 ml/m RA Volume:   23.90 ml  12.16 ml/m LA Vol (A4C):   40.5 ml 20.60 ml/m LA Biplane Vol: 31.9 ml 16.22 ml/m  AORTIC VALVE LVOT Vmax:   78.30 cm/s LVOT Vmean:  52.400 cm/s LVOT VTI:    0.160 m  AORTA Ao Root diam: 3.20 cm Ao Asc diam:  3.10 cm MITRAL VALVE               TRICUSPID VALVE MV Area (PHT): 3.17 cm    TR Peak grad:   24.2 mmHg MV Decel Time: 239 msec    TR Vmax:        246.00 cm/s MV E velocity: 0.70 cm/s MV A velocity: 83.90 cm/s  SHUNTS MV E/A ratio:  0.01        Systemic VTI:  0.16 m                            Systemic Diam: 1.90 cm Oswaldo Milian MD Electronically signed by Oswaldo Milian MD Signature Date/Time: 02/27/2020/11:21:25 AM    Final

## 2020-03-25 NOTE — Assessment & Plan Note (Signed)
Her cancer pain is better controlled She will continue long-acting pain medicine with breakthrough pain medicine as needed

## 2020-03-25 NOTE — Assessment & Plan Note (Signed)
Overall, she tolerated last cycle of therapy well except for mucositis, nausea and severe anemia I will see her again next week for further follow-up I plan for minor dose adjustment of doxorubicin next cycle to minimize some of the side effects I reviewed test results of her recent foundation 1 studies Unfortunately, we do not find any actionable mutation

## 2020-03-26 DIAGNOSIS — F32A Depression, unspecified: Secondary | ICD-10-CM | POA: Diagnosis not present

## 2020-03-26 DIAGNOSIS — Z433 Encounter for attention to colostomy: Secondary | ICD-10-CM | POA: Diagnosis not present

## 2020-03-26 DIAGNOSIS — I10 Essential (primary) hypertension: Secondary | ICD-10-CM | POA: Diagnosis not present

## 2020-03-26 DIAGNOSIS — Z48815 Encounter for surgical aftercare following surgery on the digestive system: Secondary | ICD-10-CM | POA: Diagnosis not present

## 2020-03-26 DIAGNOSIS — C561 Malignant neoplasm of right ovary: Secondary | ICD-10-CM | POA: Diagnosis not present

## 2020-03-26 DIAGNOSIS — G893 Neoplasm related pain (acute) (chronic): Secondary | ICD-10-CM | POA: Diagnosis not present

## 2020-03-26 DIAGNOSIS — F419 Anxiety disorder, unspecified: Secondary | ICD-10-CM | POA: Diagnosis not present

## 2020-03-26 DIAGNOSIS — D630 Anemia in neoplastic disease: Secondary | ICD-10-CM | POA: Diagnosis not present

## 2020-03-26 DIAGNOSIS — K5900 Constipation, unspecified: Secondary | ICD-10-CM | POA: Diagnosis not present

## 2020-03-26 LAB — BPAM RBC
Blood Product Expiration Date: 202112072359
ISSUE DATE / TIME: 202111091255
Unit Type and Rh: 5100

## 2020-03-26 LAB — TYPE AND SCREEN
ABO/RH(D): O POS
Antibody Screen: NEGATIVE
Unit division: 0

## 2020-03-27 ENCOUNTER — Inpatient Hospital Stay: Payer: Medicare HMO | Admitting: Hematology and Oncology

## 2020-03-27 ENCOUNTER — Inpatient Hospital Stay: Payer: Medicare HMO

## 2020-03-28 DIAGNOSIS — C561 Malignant neoplasm of right ovary: Secondary | ICD-10-CM | POA: Diagnosis not present

## 2020-03-28 DIAGNOSIS — G893 Neoplasm related pain (acute) (chronic): Secondary | ICD-10-CM | POA: Diagnosis not present

## 2020-03-28 DIAGNOSIS — F32A Depression, unspecified: Secondary | ICD-10-CM | POA: Diagnosis not present

## 2020-03-28 DIAGNOSIS — Z433 Encounter for attention to colostomy: Secondary | ICD-10-CM | POA: Diagnosis not present

## 2020-03-28 DIAGNOSIS — I10 Essential (primary) hypertension: Secondary | ICD-10-CM | POA: Diagnosis not present

## 2020-03-28 DIAGNOSIS — Z48815 Encounter for surgical aftercare following surgery on the digestive system: Secondary | ICD-10-CM | POA: Diagnosis not present

## 2020-03-28 DIAGNOSIS — D630 Anemia in neoplastic disease: Secondary | ICD-10-CM | POA: Diagnosis not present

## 2020-03-28 DIAGNOSIS — F419 Anxiety disorder, unspecified: Secondary | ICD-10-CM | POA: Diagnosis not present

## 2020-03-28 DIAGNOSIS — K5900 Constipation, unspecified: Secondary | ICD-10-CM | POA: Diagnosis not present

## 2020-03-31 MED FILL — Dexamethasone Sodium Phosphate Inj 100 MG/10ML: INTRAMUSCULAR | Qty: 1 | Status: AC

## 2020-03-31 MED FILL — Fosaprepitant Dimeglumine For IV Infusion 150 MG (Base Eq): INTRAVENOUS | Qty: 5 | Status: AC

## 2020-04-01 ENCOUNTER — Other Ambulatory Visit: Payer: Self-pay | Admitting: Hematology and Oncology

## 2020-04-01 ENCOUNTER — Ambulatory Visit (HOSPITAL_COMMUNITY)
Admission: RE | Admit: 2020-04-01 | Discharge: 2020-04-01 | Disposition: A | Discharge status: Home / Self Care | Payer: Medicare HMO | Source: Ambulatory Visit | Attending: Hematology and Oncology | Admitting: Hematology and Oncology

## 2020-04-01 ENCOUNTER — Encounter (HOSPITAL_COMMUNITY): Payer: Self-pay

## 2020-04-01 ENCOUNTER — Telehealth: Payer: Self-pay

## 2020-04-01 ENCOUNTER — Inpatient Hospital Stay (HOSPITAL_BASED_OUTPATIENT_CLINIC_OR_DEPARTMENT_OTHER): Payer: Medicare HMO | Admitting: Hematology and Oncology

## 2020-04-01 ENCOUNTER — Inpatient Hospital Stay: Payer: Medicare HMO

## 2020-04-01 ENCOUNTER — Other Ambulatory Visit: Payer: Self-pay

## 2020-04-01 ENCOUNTER — Encounter: Payer: Self-pay | Admitting: Hematology and Oncology

## 2020-04-01 VITALS — BP 147/68 | HR 98 | Temp 97.5°F | Resp 18 | Ht 67.0 in | Wt 181.4 lb

## 2020-04-01 DIAGNOSIS — K449 Diaphragmatic hernia without obstruction or gangrene: Secondary | ICD-10-CM | POA: Diagnosis not present

## 2020-04-01 DIAGNOSIS — C561 Malignant neoplasm of right ovary: Secondary | ICD-10-CM | POA: Diagnosis not present

## 2020-04-01 DIAGNOSIS — D539 Nutritional anemia, unspecified: Secondary | ICD-10-CM

## 2020-04-01 DIAGNOSIS — N1330 Unspecified hydronephrosis: Secondary | ICD-10-CM | POA: Diagnosis not present

## 2020-04-01 DIAGNOSIS — C569 Malignant neoplasm of unspecified ovary: Secondary | ICD-10-CM | POA: Diagnosis not present

## 2020-04-01 DIAGNOSIS — D6481 Anemia due to antineoplastic chemotherapy: Secondary | ICD-10-CM | POA: Diagnosis not present

## 2020-04-01 DIAGNOSIS — C786 Secondary malignant neoplasm of retroperitoneum and peritoneum: Secondary | ICD-10-CM | POA: Diagnosis not present

## 2020-04-01 DIAGNOSIS — C495 Malignant neoplasm of connective and soft tissue of pelvis: Secondary | ICD-10-CM

## 2020-04-01 DIAGNOSIS — R0602 Shortness of breath: Secondary | ICD-10-CM

## 2020-04-01 DIAGNOSIS — E440 Moderate protein-calorie malnutrition: Secondary | ICD-10-CM | POA: Diagnosis not present

## 2020-04-01 DIAGNOSIS — C763 Malignant neoplasm of pelvis: Secondary | ICD-10-CM | POA: Diagnosis not present

## 2020-04-01 DIAGNOSIS — T451X5A Adverse effect of antineoplastic and immunosuppressive drugs, initial encounter: Secondary | ICD-10-CM

## 2020-04-01 DIAGNOSIS — J90 Pleural effusion, not elsewhere classified: Secondary | ICD-10-CM | POA: Diagnosis not present

## 2020-04-01 DIAGNOSIS — K1231 Oral mucositis (ulcerative) due to antineoplastic therapy: Secondary | ICD-10-CM | POA: Diagnosis not present

## 2020-04-01 DIAGNOSIS — F418 Other specified anxiety disorders: Secondary | ICD-10-CM

## 2020-04-01 DIAGNOSIS — F32A Depression, unspecified: Secondary | ICD-10-CM

## 2020-04-01 DIAGNOSIS — N134 Hydroureter: Secondary | ICD-10-CM | POA: Diagnosis not present

## 2020-04-01 DIAGNOSIS — G893 Neoplasm related pain (acute) (chronic): Secondary | ICD-10-CM

## 2020-04-01 LAB — CBC WITH DIFFERENTIAL (CANCER CENTER ONLY)
Abs Immature Granulocytes: 0.5 10*3/uL — ABNORMAL HIGH (ref 0.00–0.07)
Basophils Absolute: 0.1 10*3/uL (ref 0.0–0.1)
Basophils Relative: 1 %
Eosinophils Absolute: 0 10*3/uL (ref 0.0–0.5)
Eosinophils Relative: 0 %
HCT: 29.3 % — ABNORMAL LOW (ref 36.0–46.0)
Hemoglobin: 9.7 g/dL — ABNORMAL LOW (ref 12.0–15.0)
Immature Granulocytes: 5 %
Lymphocytes Relative: 12 %
Lymphs Abs: 1.3 10*3/uL (ref 0.7–4.0)
MCH: 30.2 pg (ref 26.0–34.0)
MCHC: 33.1 g/dL (ref 30.0–36.0)
MCV: 91.3 fL (ref 80.0–100.0)
Monocytes Absolute: 1.7 10*3/uL — ABNORMAL HIGH (ref 0.1–1.0)
Monocytes Relative: 16 %
Neutro Abs: 7.1 10*3/uL (ref 1.7–7.7)
Neutrophils Relative %: 66 %
Platelet Count: 765 10*3/uL — ABNORMAL HIGH (ref 150–400)
RBC: 3.21 MIL/uL — ABNORMAL LOW (ref 3.87–5.11)
RDW: 14.2 % (ref 11.5–15.5)
WBC Count: 10.6 10*3/uL — ABNORMAL HIGH (ref 4.0–10.5)
nRBC: 0 % (ref 0.0–0.2)

## 2020-04-01 LAB — CMP (CANCER CENTER ONLY)
ALT: 9 U/L (ref 0–44)
AST: 37 U/L (ref 15–41)
Albumin: 3 g/dL — ABNORMAL LOW (ref 3.5–5.0)
Alkaline Phosphatase: 71 U/L (ref 38–126)
Anion gap: 9 (ref 5–15)
BUN: 5 mg/dL — ABNORMAL LOW (ref 8–23)
CO2: 28 mmol/L (ref 22–32)
Calcium: 9.6 mg/dL (ref 8.9–10.3)
Chloride: 100 mmol/L (ref 98–111)
Creatinine: 0.83 mg/dL (ref 0.44–1.00)
GFR, Estimated: >60 mL/min (ref >60)
Glucose, Bld: 100 mg/dL — ABNORMAL HIGH (ref 70–99)
Potassium: 3.8 mmol/L (ref 3.5–5.1)
Sodium: 137 mmol/L (ref 135–145)
Total Bilirubin: 0.4 mg/dL (ref 0.3–1.2)
Total Protein: 7.1 g/dL (ref 6.5–8.1)

## 2020-04-01 LAB — SAMPLE TO BLOOD BANK

## 2020-04-01 MED ORDER — LORAZEPAM 1 MG PO TABS
ORAL_TABLET | ORAL | Status: AC
  Filled 2020-04-01: qty 1

## 2020-04-01 MED ORDER — SODIUM CHLORIDE 0.9% FLUSH
10.0000 mL | Freq: Once | INTRAVENOUS | Status: AC
  Administered 2020-04-01: 10 mL
  Filled 2020-04-01: qty 10

## 2020-04-01 MED ORDER — IOHEXOL 9 MG/ML PO SOLN
ORAL | Status: AC
  Filled 2020-04-01: qty 1000

## 2020-04-01 MED ORDER — LORAZEPAM 1 MG PO TABS: 1.0000 mg | ORAL_TABLET | Freq: Three times a day (TID) | ORAL | 1 refills | Status: AC | PRN

## 2020-04-01 MED ORDER — LORAZEPAM 1 MG PO TABS
1.0000 mg | ORAL_TABLET | Freq: Once | ORAL | Status: AC
  Administered 2020-04-01: 1 mg via ORAL

## 2020-04-01 MED ORDER — IOHEXOL 300 MG/ML  SOLN
100.0000 mL | Freq: Once | INTRAMUSCULAR | Status: AC | PRN
  Administered 2020-04-01: 100 mL via INTRAVENOUS

## 2020-04-01 MED ORDER — IOHEXOL 9 MG/ML PO SOLN
1000.0000 mL | ORAL | Status: AC
  Administered 2020-04-01: 1000 mL via ORAL

## 2020-04-01 NOTE — Assessment & Plan Note (Signed)
There is a component of anxiety I gave her a dose of lorazepam that seems to help with her sensation of shortness of breath I sent prescription lorazepam to her pharmacy

## 2020-04-01 NOTE — Progress Notes (Signed)
[place] OFFICE PROGRESS NOTE  Patient Care Team: Michael Boston, MD as PCP - General (Internal Medicine) Awanda Mink Craige Cotta, RN as Oncology Nurse Navigator (Oncology)  ASSESSMENT & PLAN:  Ovarian cancer on right [facility]) She is not feeling well She has lost some weight and have sensation of difficulties breathing Her lung exam is benign It could be due to cancer progression altering the mechanics of her breathing versus undiagnosed blood clot and other potential causes I recommend urgent CT imaging for evaluation as soon as possible I will cancel her treatment today I will see her back tomorrow as soon as we have results of CT imaging available to discuss the next step  Anemia due to antineoplastic chemotherapy Since blood transfusion, her hemoglobin has stabilized Despite improve hemoglobin, she has extreme shortness of breath She does not need transfusion support today  Anxiety about health There is a component of anxiety I gave her a dose of lorazepam that seems to help with her sensation of shortness of breath I sent prescription lorazepam to her pharmacy  Hydronephrosis of right kidney Her renal function is stable Observe for now   Orders Placed This Encounter  Procedures  . CT CHEST ABDOMEN PELVIS W CONTRAST    Auth# [id number]    Standing Status:   Future    Standing Expiration Date:   04/01/2021    Order Specific Question:   Preferred imaging location?    Answer:   [facility]    Order Specific Question:   Call Results- Best Contact Number?    Answer:   [phone] / do not hold patient    Order Specific Question:   Radiology Contrast Protocol - do NOT remove file path    Answer:   [email or web address]    All questions were answered. The patient knows to call the clinic with any problems, questions or concerns. The total time spent in the appointment was 30 minutes encounter with patients including review  of chart and various tests results, discussions about plan of care and coordination of care plan   [name], MD 04/01/2020 1:52 PM  INTERVAL HISTORY: Please see below for problem oriented charting. She is seen with her husband for further follow-up She is not feeling well She complained of shortness of breath at rest She felt some tightness on her upper abdominal site but denies significant pain She felt somewhat swollen but denies recent changes in her bowel habits The patient denies any recent signs or symptoms of bleeding such as spontaneous epistaxis, hematuria or hematochezia.  SUMMARY OF ONCOLOGIC HISTORY: Oncology History Overview Note  Neg genetics MSI stable MMR normal PD-L1 0% [employer]: no actionable mutations   Ovarian cancer on right ([place])  01/16/2019 Initial Diagnosis   She had vague symptom of dyspareunia. Presented to ER for evaluation in November for abdominal pain and vague symptom of fullness and imaging showed abnormalities   04/16/2019 Imaging   1. Nonvisualization of the appendix. However, there is a large volume of ascites within the abdomen and pelvis which appears partially loculated. In the acute setting findings may be the sequelae of peritonitis. However, there are multiple partially calcified peritoneal nodules throughout the omentum which are concerning for peritoneal carcinomatosis. Noncalcified nodule in the left lower quadrant peritoneal reflection is noted. Further investigation with diagnostic paracentesis is advised. 2. Ill-defined low-density mass within the right iliac fossa is suspected and may represent an ovarian neoplasm. 3. Aortic atherosclerosis. 4. Small bilateral pleural effusions. 5. Hiatal  hernia. 6. Prior granulomatous disease.   Aortic Atherosclerosis (ICD10-I70.0).   04/17/2019 Tumor Marker   Patient's tumor was tested for the following markers: CA-125 Results of the tumor marker test revealed 288.   04/18/2019 Procedure    Successful ultrasound-guided diagnostic and therapeutic paracentesis yielding 3.2 liters of peritoneal fluid.   04/18/2019 Pathology Results   FINAL MICROSCOPIC DIAGNOSIS:  - Malignant cells present  - See comment   SPECIMEN ADEQUACY:  Satisfactory for evaluation   DIAGNOSTIC COMMENTS:  The cell block has scattered malignant cells.  These atypical cells are positive for cytokeraitn 7, MOC-31, Pax8, and WT-1 but negative for TTF-1 and cytokeratin 20.  Overall, the features are consistent with a primary gynecologic neoplasm   04/20/2019 Cancer Staging   Staging form: Ovary, Fallopian Tube, and Primary Peritoneal Carcinoma, AJCC 8th Edition - Clinical stage from 04/20/2019: FIGO Stage IIIC (cT3c, cN0, cM0) - Signed by [name], MD on 04/20/2019   04/25/2019 Procedure   Successful ultrasound-guided therapeutic paracentesis yielding 2.5 liters of peritoneal fluid.     04/27/2019 - 09/10/2019 Chemotherapy   The patient had carboplatin and taxol x 3 cycles for neoadjuvant chemotherapy treatment, followed by interval debulking surgery and 3 more cycles of chemotherapy     05/17/2019 Procedure   Placement of a subcutaneous port device. Catheter tip at the SVC and right atrium junction.   06/11/2019 Tumor Marker   Patient's tumor was tested for the following markers: CA-125 Results of the tumor marker test revealed 20.8   06/18/2019 Imaging   CT chest, abdomen and pelvis 1. Positive response to therapy. Complex 6.6 cm cystic right adnexal mass is decreased in size. Omental nodularity has largely resolved. Ascites has resolved. No adenopathy. 2. No evidence of metastatic disease in the chest. 3. Chronic findings include: Aortic Atherosclerosis (ICD10-I70.0). Small hiatal hernia. Mild sigmoid diverticulosis.   07/03/2019 Surgery   Preoperative Diagnosis: ovarian cancer stage IIIC     Procedure(s) Performed: Robotic-assisted laparoscopic bilateral salpingo-oophorectomy, omentectomy, radical  tumor debulking with mini-laparotomy. Optimal cytoreduction with no gross residual disease remaining.    Surgeon: Everitt Amber, M.D. Specimens: Bilateral ovaries, fallopian tubes, omentum    Indication for Procedure:  Patient has a history of stage IIIC ovarian cancer, s/p 3 cycles of neoadjuvant chemotherapy with good partial clinical response. Right ovarian mass on imaging and omental caking.   Operative Findings: 6-8cm right ovarian mass densely adherent to pelvic sidewall with filmy adhesions to colonic epiploica but not bowel wall. Normal appendix. Slightly enlarged left tube and ovary but without discrete mass. Omental nodularity throughout infracolic omentum. Tiny miliary <62m nodules on left diaphragm.    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 Other Tissue/ Organ Involvement: Left ovary and omentum Largest Extrapelvic Peritoneal Focus: 4.3 cm tumor deposit in the omentum Peritoneal/Ascitic Fluid: Positive for carcinoma (([id number] Treatment Effect: Moderate response identified (CRS 2) Regional Lymph Nodes: No lymph nodes submitted or found Pathologic Stage Classification (pTNM, AJCC 8th Edition): pT3c, pNx Representative Tumor Block: B10 Comment(s): Only the serous component shows metastasis to the omentum and spread to left ovary and right fallopian tube   07/27/2019 Tumor Marker   Patient's tumor was tested for the following markers: CA-125 Results of the tumor  marker test revealed 13.7   08/14/2019 Genetic Testing   Negative germline + somatic testing. No pathogenic variants  identified on the [employer]. VUS in MRE11A identified in the germline.  The report date is 08/14/2019.   The CancerNext gene panel offered by [employer] includes sequencing and rearrangement analysis for the following 36 genes: APC*, ATM*, AXIN2, BARD1, BMPR1A, BRCA1*, BRCA2*, BRIP1*, CDH1*, CDK4, CDKN2A, CHEK2*, DICER1, MLH1*, MSH2*, MSH3, MSH6*, MUTYH*, NBN, NF1*, NTHL1, PALB2*, PMS2*, PTEN*, RAD51C*, RAD51D*, RECQL, SMAD4, SMARCA4, STK11 and TP53* (sequencing and deletion/duplication); HOXB13, POLD1 and POLE (sequencing only); EPCAM and GREM1 (deletion/duplication only).   Somatic genes analyzed through TumorNext-HRD: ATM, BARD1, BRCA1, BRCA2, BRIP1, CHEK2, MRE11A, NBN, PALB2, RAD51C, RAD51D.     09/10/2019 Tumor Marker   Patient's tumor was tested for the following markers: CA-125 Results of the tumor marker test revealed 9.5   10/10/2019 Imaging   1. No findings suspicious for recurrent metastatic disease in the abdomen or pelvis. No adenopathy. Minimal smooth peritoneal thickening in the paracolic gutters and deep pelvis, nonspecific, with no discrete peritoneal implants. No ascites. Continued CT surveillance warranted. 2. Chronic findings include: Aortic Atherosclerosis (ICD10-I70.0). Small hiatal hernia. Minimal sigmoid diverticulosis.     10/10/2019 Tumor Marker   Patient's tumor was tested for the following markers: CA-125 Results of the tumor marker test revealed 8.9   02/18/2020 Tumor Marker   Patient's tumor was tested for the following markers: CA-125. Results of the tumor marker test revealed 10.9   02/18/2020 Imaging   No acute abnormality noted.     02/21/2020 Imaging   1. Complex cystic mass versus less likely complex abscess along the vaginal cuff and extending anterior to the rectum and along the right lateral side of the rectosigmoid junction, with components of the process extending up towards the adnexa, right greater than left. This  measures up to 10.9 cm in diameter. If this represents an abscess I would expect the patient have striking symptoms (fever, pain, etc.). Given the patient's history, the appearance tends to favor recurrent ovarian malignancy over abscess. 2. Mild right hydronephrosis and hydroureter extending down to the level of the recurrent pelvic mass, indicating some low-grade obstruction. 3. There is sigmoid colon diverticulosis along with some stranding and wall indistinctness of the sigmoid colon which could reflect diverticulitis or colitis. Assessment is made more complex by the proximity of the pelvic mass. 4. Prominence of stool proximal to the sigmoid colon possibly from constipation or low-grade partial obstruction. 5. Other imaging findings of potential clinical significance: Small type 1 hiatal hernia. Old granulomatous disease. Lumbar spondylosis and degenerative disc disease causing impingement at L3-4, L4-5, and L5-S1. Mild rectus diastasis with transverse colon and adipose tissue extending along the diastatic region. 6. Aortic atherosclerosis.   02/27/2020 Echocardiogram   1. Left ventricular ejection fraction, by estimation, is 60 to 65%. The left ventricle has normal function. The left ventricle has no regional wall motion abnormalities. Left ventricular diastolic parameters are consistent with Grade I diastolic dysfunction (impaired relaxation). The average left ventricular global longitudinal strain is -18.3 %.  2. Right ventricular systolic function is normal. The right ventricular size is normal. There is normal pulmonary artery systolic pressure. The estimated right ventricular systolic pressure is 54.2 mmHg.  3. The mitral valve is normal in structure. Trivial mitral valve regurgitation.  4. The aortic valve is tricuspid. Aortic valve regurgitation is mild. No aortic stenosis is present.  5. The inferior vena cava is normal in size with greater  than 50% respiratory variability, suggesting  right atrial pressure of 3 mmHg.   03/02/2020 - 03/06/2020 Hospital Admission   She was admitted for management of bowel obstruction   03/04/2020 Pathology Results   High grade carcinoma consistent with high grade serous carcinoma.   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   Surgeon: Thereasa Solo, MD.    Operative Findings: a  15cm fixed pelvic mass filling the pelvis and obstructing the sigmoid colon. Moderately distended transverse colon. Normal caliber small intestine. Miliary studding of diaphragm with tumor. No other significant intraperitoneal disease. Ventral incisional hernia in the site of the prior midline vertical epigastric incision.   03/11/2020 -  Chemotherapy   The patient had DOXOrubicin for chemotherapy treatment.     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       REVIEW OF SYSTEMS:   Constitutional: Denies fevers, chills or abnormal weight loss Eyes: Denies blurriness of vision Ears, nose, mouth, throat, and face: Denies mucositis or sore throat Cardiovascular: Denies palpitation, chest discomfort or lower extremity swelling Gastrointestinal:  Denies nausea, heartburn or change in bowel habits Skin: Denies abnormal skin rashes Lymphatics: Denies new lymphadenopathy or easy bruising Neurological:Denies numbness, tingling or new weaknesses Behavioral/Psych: Mood is stable, no new changes   All other systems were reviewed with the patient and are negative.  I have reviewed the past medical history, past surgical history, social history and family history with the patient and they are unchanged from previous note.  ALLERGIES:  is allergic to bee venom, amlodipine, demerol [meperidine hcl], lisinopril, losartan, and other.  MEDICATIONS:  Current Outpatient Medications  Medication Sig Dispense Refill  . Cholecalciferol (VITAMIN D-3 PO) Take 4,000 Units by mouth daily.     Marland Kitchen enoxaparin (LOVENOX) 40 MG/0.4ML injection Inject 0.4 mLs (40 mg total) into the skin daily for 26 doses. 10.4 mL 0  . estradiol (ESTRACE VAGINAL) 0.1 MG/GM vaginal cream Place 1 Applicatorful vaginally 3 (three) times a week. (Patient taking differently: Place 1 Applicatorful vaginally as needed. ) 42.5 g 12  . FLUoxetine (PROZAC) 20 MG capsule Take 1 capsule (20 mg total) by mouth daily. 90 capsule 3  . lidocaine-prilocaine (EMLA) cream Apply to affected area once (Patient taking differently: Apply 1 application topically daily as needed (port access). ) 30 g 3  . loratadine (CLARITIN) 10 MG tablet Take 10 mg by mouth daily as needed for allergies.    Marland Kitchen LORazepam (ATIVAN) 1 MG tablet Take 1 tablet (1 mg total) by mouth every 8 (eight) hours as needed for anxiety or sleep. 90 tablet 1  . magic mouthwash w/lidocaine SOLN Take 5 mLs by mouth 4 (four) times daily for 7 days. 5 ml 4 x a day, swish and spit. 240 mL 0  . morphine (MS CONTIN) 30 MG 12 hr tablet Take 1 tablet (30 mg total) by mouth every 8 (eight) hours. 90 tablet 0  . morphine (MSIR) 30 MG tablet Take 1 tablet (30 mg total) by mouth every 4 (  four) hours as needed for severe pain. 60 tablet 0  . ondansetron (ZOFRAN) 8 MG tablet Take 8 mg by mouth every 8 (eight) hours as needed for nausea or vomiting.     . polyethylene glycol (MIRALAX / GLYCOLAX) 17 g packet Take 17 g by mouth 2 (two) times daily.    . prochlorperazine (COMPAZINE) 10 MG tablet Take 10  mg by mouth every 6 (six) hours as needed for nausea or vomiting.     . promethazine (PHENERGAN) 25 MG tablet Take 1 tablet (25 mg total) by mouth every 6 (six) hours as needed for nausea or vomiting. 60 tablet 11  . simvastatin (ZOCOR) 40 MG tablet Take 1 tablet (40 mg total) by mouth at bedtime. 90 tablet 4   No current facility-administered medications for this visit.    PHYSICAL EXAMINATION: ECOG PERFORMANCE STATUS: 2 - Symptomatic, <50% confined to bed  Vitals:   04/01/20 1054  BP: (!) 147/68  Pulse: 98  Resp: 18  Temp: (!) 97.5 F (36.4 C)  SpO2: 100%   Filed Weights   04/01/20 1054  Weight: 181 lb 6.4 oz (82.3 kg)    GENERAL:alert, no distress and comfortable SKIN: skin color, texture, turgor are normal, no rashes or significant lesions EYES: normal, Conjunctiva are pink and non-injected, sclera clear OROPHARYNX:no exudate, no erythema and lips, buccal mucosa, and tongue normal  NECK: supple, thyroid normal size, non-tender, without nodularity LYMPH:  no palpable lymphadenopathy in the cervical, axillary or inguinal LUNGS: clear to auscultation and percussion with normal breathing effort HEART: regular rate & rhythm and no murmurs and no lower extremity edema ABDOMEN:abdomen soft, non-tender. Mildly distended Musculoskeletal:no cyanosis of digits and no clubbing  NEURO: alert & oriented x 3 with fluent speech, no focal motor/sensory deficits  LABORATORY DATA:  I have reviewed the data as listed    Component Value Date/Time   NA 137 04/01/2020 1033   NA 137 10/27/2011 0738   K 3.8 04/01/2020 1033   K 4.1 10/27/2011 0738   CL 100 04/01/2020 1033   CL 105 10/27/2011 0738   CO2 28 04/01/2020 1033   CO2 26 10/27/2011 0738   GLUCOSE 100 (H) 04/01/2020 1033   GLUCOSE 111 (H) 10/27/2011 0738   BUN 5 (L) 04/01/2020 1033   BUN 15 10/27/2011 0738   CREATININE 0.83 04/01/2020 1033   CREATININE 1.01 (H) 12/16/2015 1030   CALCIUM 9.6 04/01/2020 1033   CALCIUM 10.0  09/28/2013 0000   PROT 7.1 04/01/2020 1033   PROT 8.1 10/27/2011 0738   ALBUMIN 3.0 (L) 04/01/2020 1033   ALBUMIN 3.9 10/27/2011 0738   AST 37 04/01/2020 1033   ALT 9 04/01/2020 1033   ALT 29 10/27/2011 0738   ALKPHOS 71 04/01/2020 1033   ALKPHOS 102 10/27/2011 0738   BILITOT 0.4 04/01/2020 1033   GFRNONAA >60 04/01/2020 1033   GFRNONAA 59 (L) 12/16/2015 1030   GFRAA >60 02/18/2020 1306   GFRAA 68 12/16/2015 1030    No results found for: SPEP, UPEP  Lab Results  Component Value Date   WBC 10.6 (H) 04/01/2020   NEUTROABS 7.1 04/01/2020   HGB 9.7 (L) 04/01/2020   HCT 29.3 (L) 04/01/2020   MCV 91.3 04/01/2020   PLT 765 (H) 04/01/2020      Chemistry      Component Value Date/Time   NA 137 04/01/2020 1033   NA 137 10/27/2011 0738   K 3.8 04/01/2020 1033   K 4.1 10/27/2011 0738   CL  100 04/01/2020 1033   CL 105 10/27/2011 0738   CO2 28 04/01/2020 1033   CO2 26 10/27/2011 0738   BUN 5 (L) 04/01/2020 1033   BUN 15 10/27/2011 0738   CREATININE 0.83 04/01/2020 1033   CREATININE 1.01 (H) 12/16/2015 1030   GLU 98 09/28/2013 0000      Component Value Date/Time   CALCIUM 9.6 04/01/2020 1033   CALCIUM 10.0 09/28/2013 0000   ALKPHOS 71 04/01/2020 1033   ALKPHOS 102 10/27/2011 0738   AST 37 04/01/2020 1033   ALT 9 04/01/2020 1033   ALT 29 10/27/2011 0738   BILITOT 0.4 04/01/2020 1033       RADIOGRAPHIC STUDIES: I have personally reviewed the radiological images as listed and agreed with the findings in the report. CT Abdomen Pelvis W Contrast  Result Date: 03/08/2020 CLINICAL DATA:  Recurrent ovarian carcinoma. Recent exploratory laparotomy with transverse loop colostomy. EXAM: CT ABDOMEN AND PELVIS WITH CONTRAST TECHNIQUE: Multidetector CT imaging of the abdomen and pelvis was performed using the standard protocol following bolus administration of intravenous contrast. CONTRAST:  133m OMNIPAQUE IOHEXOL 300 MG/ML  SOLN COMPARISON:  CT scan 03/02/2020 FINDINGS: Lower  chest: Trace pericardial effusions. Mild RIGHT basilar atelectasis. No pneumonia. Hepatobiliary: No focal hepatic lesion. Postcholecystectomy. No biliary dilatation. Pancreas: Pancreas is normal. No ductal dilatation. No pancreatic inflammation. Spleen: Normal spleen Adrenals/urinary tract: Adrenal glands normal. Persistent RIGHT hydronephrosis and hydroureter. Hydroureter to the level of the pelvic mass. Findings similar to CT 03/02/2020. Extrarenal pelvis on the LEFT. No ureteral dilatation. Bladder is compressed anteriorly by pelvic mass. Small a gas within the bladder related to recent surgery/catheterization. Stomach/Bowel: No gastric distension. The stomach and duodenum normal. Small bowel normal. Terminal ileum and cecum normal. Appendix not identified. Ascending colon normal. Transverse colon exits a LEFT midline ostomy without complication. Vascular/Lymphatic: Abdominal aorta is normal caliber. No periportal or retroperitoneal adenopathy. No pelvic adenopathy. Reproductive: Large multilobulated pelvic mass again demonstrated. Example lobular component measures 8.6 x 8.2 cm (image 74/2) compared to 7.0 by 7.5 cm. More anterior lobular component measures 5.7 cm by 4.9 cm compared to 5.5 x 4.2 cm. In craniocaudad dimension cul-de-sac mass measures 11.8 cm compared to 9.6 cm. Other: Postsurgical change in the abdominal wall consistent laparoscopic surgery. No complicating features. Musculoskeletal: No aggressive osseous lesion. IMPRESSION: 1. No evidence of bowel obstruction.  Stomach small bowel normal. 2. Post laparoscopic transverse loop colostomy without complicating features. 3. Persistent moderate RIGHT ureteral obstruction. 4. Pelvic mass measures larger in short interval follow-up from 03/02/2020. Electronically Signed   By: SSuzy BouchardM.D.   On: 03/08/2020 15:20

## 2020-04-01 NOTE — Telephone Encounter (Signed)
I have to DC her Xanax and replace with ativan Prescription sent

## 2020-04-01 NOTE — Telephone Encounter (Signed)
Called and scheduled appt at 1130 for tomorrow. Instructed to arrive at 11 am. She verbalized understanding.  She is driving to [facility] with her husband now for a CT scan scheduled at 4 pm.

## 2020-04-01 NOTE — Assessment & Plan Note (Addendum)
She is not feeling well She has lost some weight and have sensation of difficulties breathing Her lung exam is benign It could be due to cancer progression altering the mechanics of her breathing versus undiagnosed blood clot and other potential causes I recommend urgent CT imaging for evaluation as soon as possible I will cancel her treatment today I will see her back tomorrow as soon as we have results of CT imaging available to discuss the next step

## 2020-04-01 NOTE — Telephone Encounter (Signed)
Called and given below message. She verbalized understanding. 

## 2020-04-01 NOTE — Assessment & Plan Note (Signed)
Since blood transfusion, her hemoglobin has stabilized Despite improve hemoglobin, she has extreme shortness of breath She does not need transfusion support today

## 2020-04-01 NOTE — Telephone Encounter (Signed)
She called and left a message. The Ativan that she got in the office is definitely helping. She is asking for a Rx to be sent to [facility] in [place], [place].

## 2020-04-01 NOTE — Assessment & Plan Note (Signed)
Her renal function is stable Observe for now

## 2020-04-02 ENCOUNTER — Other Ambulatory Visit: Payer: Self-pay

## 2020-04-02 ENCOUNTER — Inpatient Hospital Stay (HOSPITAL_BASED_OUTPATIENT_CLINIC_OR_DEPARTMENT_OTHER): Payer: Medicare HMO | Admitting: Hematology and Oncology

## 2020-04-02 ENCOUNTER — Encounter: Payer: Self-pay | Admitting: Hematology and Oncology

## 2020-04-02 ENCOUNTER — Telehealth: Payer: Self-pay

## 2020-04-02 DIAGNOSIS — Z7189 Other specified counseling: Secondary | ICD-10-CM | POA: Diagnosis not present

## 2020-04-02 DIAGNOSIS — F418 Other specified anxiety disorders: Secondary | ICD-10-CM | POA: Diagnosis not present

## 2020-04-02 DIAGNOSIS — G893 Neoplasm related pain (acute) (chronic): Secondary | ICD-10-CM | POA: Diagnosis not present

## 2020-04-02 DIAGNOSIS — C763 Malignant neoplasm of pelvis: Secondary | ICD-10-CM | POA: Diagnosis not present

## 2020-04-02 DIAGNOSIS — K449 Diaphragmatic hernia without obstruction or gangrene: Secondary | ICD-10-CM | POA: Diagnosis not present

## 2020-04-02 DIAGNOSIS — C561 Malignant neoplasm of right ovary: Secondary | ICD-10-CM

## 2020-04-02 DIAGNOSIS — T451X5A Adverse effect of antineoplastic and immunosuppressive drugs, initial encounter: Secondary | ICD-10-CM | POA: Diagnosis not present

## 2020-04-02 DIAGNOSIS — D6481 Anemia due to antineoplastic chemotherapy: Secondary | ICD-10-CM | POA: Diagnosis not present

## 2020-04-02 DIAGNOSIS — K1231 Oral mucositis (ulcerative) due to antineoplastic therapy: Secondary | ICD-10-CM | POA: Diagnosis not present

## 2020-04-02 DIAGNOSIS — C786 Secondary malignant neoplasm of retroperitoneum and peritoneum: Secondary | ICD-10-CM | POA: Diagnosis not present

## 2020-04-02 DIAGNOSIS — E440 Moderate protein-calorie malnutrition: Secondary | ICD-10-CM | POA: Diagnosis not present

## 2020-04-02 NOTE — Progress Notes (Signed)
[place] OFFICE PROGRESS NOTE  Patient Care Team: Michael Boston, MD as PCP - General (Internal Medicine) Awanda Mink Craige Cotta, RN as Oncology Nurse Navigator (Oncology)  ASSESSMENT & PLAN:  Ovarian cancer on right [facility]) I reviewed CT imaging with the patient and her husband Compared to CT imaging a month ago, there are new findings with slightly enlarged lymphadenopathy, moderate amount of ascites and stable size of her pelvic mass This is not indicative of failure of treatment.  CT imaging was ordered a bit sooner than planned because of her symptomatic shortness of breath and discomfort We discussed the risk and benefits of continuing chemotherapy Her quality of life has declined dramatically over the past few weeks Ultimately, she has made informed decision to stop palliative chemotherapy and to get enrolled in home-based palliative care with hospice I will cancel her treatment in the near future I have removed unnecessary long-term medications of her list I do not recommend port flushes on maintenance in the future  Cancer associated pain Her pain is well controlled with pain medications for now We discussed future pain medicine refills  Anxiety about health I sent prescription for lorazepam for her to take as needed She found that very helpful for her to cope  Goals of care, counseling/discussion We had numerous goals of care discussions in the past She has advanced directive and living will in place We discussed prognosis, likely measured in less than 3 months We discussed the role of palliative care with hospice and she is in agreement for enrollment I gave her a DNR order today   No orders of the defined types were placed in this encounter.   All questions were answered. The patient knows to call the clinic with any problems, questions or concerns. The total time spent in the appointment was 40 minutes encounter with patients including review of chart and  various tests results, discussions about plan of care and coordination of care plan   [name], MD 04/02/2020 12:23 PM  INTERVAL HISTORY: Please see below for problem oriented charting. She is seen urgently today to review results of her CT imaging from yesterday She felt better after prescription lorazepam Her pain is well controlled She still have shortness of breath on minimal exertion and generalized weakness We spent majority of our time today to discuss results of imaging study and goals of care  SUMMARY OF ONCOLOGIC HISTORY: Oncology History Overview Note  Neg genetics MSI stable MMR normal PD-L1 0% [facility]: no actionable mutations   Ovarian cancer on right ([place])  01/16/2019 Initial Diagnosis   She had vague symptom of dyspareunia. Presented to ER for evaluation in November for abdominal pain and vague symptom of fullness and imaging showed abnormalities   04/16/2019 Imaging   1. Nonvisualization of the appendix. However, there is a large volume of ascites within the abdomen and pelvis which appears partially loculated. In the acute setting findings may be the sequelae of peritonitis. However, there are multiple partially calcified peritoneal nodules throughout the omentum which are concerning for peritoneal carcinomatosis. Noncalcified nodule in the left lower quadrant peritoneal reflection is noted. Further investigation with diagnostic paracentesis is advised. 2. Ill-defined low-density mass within the right iliac fossa is suspected and may represent an ovarian neoplasm. 3. Aortic atherosclerosis. 4. Small bilateral pleural effusions. 5. Hiatal hernia. 6. Prior granulomatous disease.   Aortic Atherosclerosis (ICD10-I70.0).   04/17/2019 Tumor Marker   Patient's tumor was tested for the following markers: CA-125 Results of  the tumor marker test revealed 288.   04/18/2019 Procedure   Successful ultrasound-guided diagnostic and therapeutic paracentesis yielding 3.2  liters of peritoneal fluid.   04/18/2019 Pathology Results   FINAL MICROSCOPIC DIAGNOSIS:  - Malignant cells present  - See comment   SPECIMEN ADEQUACY:  Satisfactory for evaluation   DIAGNOSTIC COMMENTS:  The cell block has scattered malignant cells.  These atypical cells are positive for cytokeraitn 7, MOC-31, Pax8, and WT-1 but negative for TTF-1 and cytokeratin 20.  Overall, the features are consistent with a primary gynecologic neoplasm   04/20/2019 Cancer Staging   Staging form: Ovary, Fallopian Tube, and Primary Peritoneal Carcinoma, AJCC 8th Edition - Clinical stage from 04/20/2019: FIGO Stage IIIC (cT3c, cN0, cM0) - Signed by Artis Delay, MD on 04/20/2019   04/25/2019 Procedure   Successful ultrasound-guided therapeutic paracentesis yielding 2.5 liters of peritoneal fluid.     04/27/2019 - 09/10/2019 Chemotherapy   The patient had carboplatin and taxol x 3 cycles for neoadjuvant chemotherapy treatment, followed by interval debulking surgery and 3 more cycles of chemotherapy     05/17/2019 Procedure   Placement of a subcutaneous port device. Catheter tip at the SVC and right atrium junction.   06/11/2019 Tumor Marker   Patient's tumor was tested for the following markers: CA-125 Results of the tumor marker test revealed 20.8   06/18/2019 Imaging   CT chest, abdomen and pelvis 1. Positive response to therapy. Complex 6.6 cm cystic right adnexal mass is decreased in size. Omental nodularity has largely resolved. Ascites has resolved. No adenopathy. 2. No evidence of metastatic disease in the chest. 3. Chronic findings include: Aortic Atherosclerosis (ICD10-I70.0). Small hiatal hernia. Mild sigmoid diverticulosis.   07/03/2019 Surgery   Preoperative Diagnosis: ovarian cancer stage IIIC     Procedure(s) Performed: Robotic-assisted laparoscopic bilateral salpingo-oophorectomy, omentectomy, radical tumor debulking with mini-laparotomy. Optimal cytoreduction with no gross residual  disease remaining.    Surgeon: Adolphus Birchwood, M.D. Specimens: Bilateral ovaries, fallopian tubes, omentum    Indication for Procedure:  Patient has a history of stage IIIC ovarian cancer, s/p 3 cycles of neoadjuvant chemotherapy with good partial clinical response. Right ovarian mass on imaging and omental caking.   Operative Findings: 6-8cm right ovarian mass densely adherent to pelvic sidewall with filmy adhesions to colonic epiploica but not bowel wall. Normal appendix. Slightly enlarged left tube and ovary but without discrete mass. Omental nodularity throughout infracolic omentum. Tiny miliary <57mm nodules on left diaphragm.    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 Other Tissue/ Organ Involvement: Left ovary and omentum Largest Extrapelvic Peritoneal Focus: 4.3 cm tumor deposit in the omentum Peritoneal/Ascitic Fluid: Positive for carcinoma ([id number]) Treatment Effect: Moderate response identified (CRS 2) Regional Lymph Nodes: No lymph nodes submitted or found Pathologic Stage Classification (pTNM, AJCC 8th Edition): pT3c, pNx Representative Tumor Block: B10 Comment(s): Only the serous component shows metastasis to the omentum and spread to left ovary and right fallopian tube   07/27/2019 Tumor Marker   Patient's tumor was tested for the following markers: CA-125 Results of the tumor marker test revealed 13.7   08/14/2019 Genetic Testing   Negative germline + somatic testing. No pathogenic variants identified on the [employer]. VUS in MRE11A  identified in  the germline.  The report date is 08/14/2019.   The CancerNext gene panel offered by [employer] includes sequencing and rearrangement analysis for the following 36 genes: APC*, ATM*, AXIN2, BARD1, BMPR1A, BRCA1*, BRCA2*, BRIP1*, CDH1*, CDK4, CDKN2A, CHEK2*, DICER1, MLH1*, MSH2*, MSH3, MSH6*, MUTYH*, NBN, NF1*, NTHL1, PALB2*, PMS2*, PTEN*, RAD51C*, RAD51D*, RECQL, SMAD4, SMARCA4, STK11 and TP53* (sequencing and deletion/duplication); HOXB13, POLD1 and POLE (sequencing only); EPCAM and GREM1 (deletion/duplication only).   Somatic genes analyzed through TumorNext-HRD: ATM, BARD1, BRCA1, BRCA2, BRIP1, CHEK2, MRE11A, NBN, PALB2, RAD51C, RAD51D.     09/10/2019 Tumor Marker   Patient's tumor was tested for the following markers: CA-125 Results of the tumor marker test revealed 9.5   10/10/2019 Imaging   1. No findings suspicious for recurrent metastatic disease in the abdomen or pelvis. No adenopathy. Minimal smooth peritoneal thickening in the paracolic gutters and deep pelvis, nonspecific, with no discrete peritoneal implants. No ascites. Continued CT surveillance warranted. 2. Chronic findings include: Aortic Atherosclerosis (ICD10-I70.0). Small hiatal hernia. Minimal sigmoid diverticulosis.     10/10/2019 Tumor Marker   Patient's tumor was tested for the following markers: CA-125 Results of the tumor marker test revealed 8.9   02/18/2020 Tumor Marker   Patient's tumor was tested for the following markers: CA-125. Results of the tumor marker test revealed 10.9   02/18/2020 Imaging   No acute abnormality noted.     02/21/2020 Imaging   1. Complex cystic mass versus less likely complex abscess along the vaginal cuff and extending anterior to the rectum and along the right lateral side of the rectosigmoid junction, with components of the process extending up towards the adnexa, right greater than left. This measures up to 10.9 cm in diameter. If this represents an abscess I would expect the  patient have striking symptoms (fever, pain, etc.). Given the patient's history, the appearance tends to favor recurrent ovarian malignancy over abscess. 2. Mild right hydronephrosis and hydroureter extending down to the level of the recurrent pelvic mass, indicating some low-grade obstruction. 3. There is sigmoid colon diverticulosis along with some stranding and wall indistinctness of the sigmoid colon which could reflect diverticulitis or colitis. Assessment is made more complex by the proximity of the pelvic mass. 4. Prominence of stool proximal to the sigmoid colon possibly from constipation or low-grade partial obstruction. 5. Other imaging findings of potential clinical significance: Small type 1 hiatal hernia. Old granulomatous disease. Lumbar spondylosis and degenerative disc disease causing impingement at L3-4, L4-5, and L5-S1. Mild rectus diastasis with transverse colon and adipose tissue extending along the diastatic region. 6. Aortic atherosclerosis.   02/27/2020 Echocardiogram   1. Left ventricular ejection fraction, by estimation, is 60 to 65%. The left ventricle has normal function. The left ventricle has no regional wall motion abnormalities. Left ventricular diastolic parameters are consistent with Grade I diastolic dysfunction (impaired relaxation). The average left ventricular global longitudinal strain is -18.3 %.  2. Right ventricular systolic function is normal. The right ventricular size is normal. There is normal pulmonary artery systolic pressure. The estimated right ventricular systolic pressure is 54.0 mmHg.  3. The mitral valve is normal in structure. Trivial mitral valve regurgitation.  4. The aortic valve is tricuspid. Aortic valve regurgitation is mild. No aortic stenosis is present.  5. The inferior vena cava is normal in size with greater than 50% respiratory variability, suggesting right atrial pressure of 3 mmHg.   03/02/2020 - 03/06/2020 Hospital Admission   She  was admitted for management of bowel  obstruction   03/04/2020 Pathology Results   High grade carcinoma consistent with high grade serous carcinoma.   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   Surgeon: Thereasa Solo, MD.    Operative Findings: a  15cm fixed pelvic mass filling the pelvis and obstructing the sigmoid colon. Moderately distended transverse colon. Normal caliber small intestine. Miliary studding of diaphragm with tumor. No other significant intraperitoneal disease. Ventral incisional hernia in the site of the prior midline vertical epigastric incision.   03/11/2020 -  Chemotherapy   The patient had DOXOrubicin for chemotherapy treatment.     03/18/2020 Tumor Marker   Patient's tumor was tested for the following markers: CA-125 Results of the tumor marker test revealed 49.7   04/01/2020 Imaging   CT CHEST IMPRESSION   1.  No acute process or evidence of metastatic disease in the chest. 2. Small hiatal hernia with dilated contrast filled esophagus, suggesting gastroesophageal reflux or esophageal dysmotility. 3. Tiny bilateral pleural effusions.   CT ABDOMEN AND PELVIS IMPRESSION   1. Primarily similar tumor burden within the pelvis compared to 03/08/2020. New low-density lesion within the upper right hemipelvis could represent progressive disease or loculated ascites. 2. Increase in moderate volume abdominopelvic ascites. 3. Enlarging portacaval node, consistent with nodal metastasis. 4. Persistent moderate right-sided hydroureteronephrosis. 5.  Aortic Atherosclerosis (ICD10-I70.0).   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       REVIEW OF SYSTEMS:   Constitutional: Denies fevers, chills or abnormal weight loss Eyes: Denies blurriness of vision Ears, nose, mouth, throat, and face: Denies mucositis or sore throat Cardiovascular: Denies palpitation, chest discomfort or lower extremity swelling Gastrointestinal:  Denies nausea, heartburn or change in bowel habits Skin: Denies abnormal skin rashes Lymphatics: Denies new lymphadenopathy or easy bruising Neurological:Denies numbness, tingling or new weaknesses Behavioral/Psych: Mood is stable, no new changes  All other systems were reviewed with the patient and are negative.  I have reviewed the past medical history, past surgical history, social history and family history with the patient and they are unchanged from previous note.  ALLERGIES:  is allergic to bee venom, amlodipine, demerol [meperidine hcl], lisinopril, losartan, and other.  MEDICATIONS:  Current Outpatient Medications  Medication Sig Dispense Refill  . estradiol (ESTRACE VAGINAL) 0.1 MG/GM vaginal cream Place 1 Applicatorful vaginally 3 (three) times a week. (Patient taking differently: Place 1 Applicatorful vaginally as needed. ) 42.5 g 12  . FLUoxetine (PROZAC) 20 MG capsule Take 1 capsule (20 mg total) by mouth daily. 90 capsule 3  . loratadine (CLARITIN) 10 MG tablet Take 10 mg by mouth daily as needed for allergies.    Marland Kitchen LORazepam (ATIVAN) 1 MG tablet Take 1 tablet (1 mg total) by mouth every 8 (eight) hours as needed for anxiety or sleep. 90 tablet 1  . morphine (MS CONTIN) 30 MG 12 hr tablet Take 1 tablet (30 mg total) by mouth every 8 (eight) hours. 90 tablet 0  . morphine (MSIR) 30 MG tablet Take 1 tablet (30 mg total) by mouth every 4 (four) hours as needed for severe pain. 60 tablet 0  . ondansetron (ZOFRAN) 8 MG tablet Take  8 mg by mouth every 8 (eight) hours as needed for nausea or  vomiting.     . polyethylene glycol (MIRALAX / GLYCOLAX) 17 g packet Take 17 g by mouth 2 (two) times daily.    . prochlorperazine (COMPAZINE) 10 MG tablet Take 10 mg by mouth every 6 (six) hours as needed for nausea or vomiting.     . promethazine (PHENERGAN) 25 MG tablet Take 1 tablet (25 mg total) by mouth every 6 (six) hours as needed for nausea or vomiting. 60 tablet 11   No current facility-administered medications for this visit.    PHYSICAL EXAMINATION: ECOG PERFORMANCE STATUS: 2 - Symptomatic, <50% confined to bed  Vitals:   04/02/20 1131  BP: 136/87  Pulse: (!) 120  Resp: 18  Temp: 97.9 F (36.6 C)  SpO2: 100%   Filed Weights   04/02/20 1131  Weight: 180 lb 3.2 oz (81.7 kg)    GENERAL:alert, no distress and comfortable NEURO: alert & oriented x 3 with fluent speech, no focal motor/sensory deficits  LABORATORY DATA:  I have reviewed the data as listed    Component Value Date/Time   NA 137 04/01/2020 1033   NA 137 10/27/2011 0738   K 3.8 04/01/2020 1033   K 4.1 10/27/2011 0738   CL 100 04/01/2020 1033   CL 105 10/27/2011 0738   CO2 28 04/01/2020 1033   CO2 26 10/27/2011 0738   GLUCOSE 100 (H) 04/01/2020 1033   GLUCOSE 111 (H) 10/27/2011 0738   BUN 5 (L) 04/01/2020 1033   BUN 15 10/27/2011 0738   CREATININE 0.83 04/01/2020 1033   CREATININE 1.01 (H) 12/16/2015 1030   CALCIUM 9.6 04/01/2020 1033   CALCIUM 10.0 09/28/2013 0000   PROT 7.1 04/01/2020 1033   PROT 8.1 10/27/2011 0738   ALBUMIN 3.0 (L) 04/01/2020 1033   ALBUMIN 3.9 10/27/2011 0738   AST 37 04/01/2020 1033   ALT 9 04/01/2020 1033   ALT 29 10/27/2011 0738   ALKPHOS 71 04/01/2020 1033   ALKPHOS 102 10/27/2011 0738   BILITOT 0.4 04/01/2020 1033   GFRNONAA >60 04/01/2020 1033   GFRNONAA 59 (L) 12/16/2015 1030   GFRAA >60 02/18/2020 1306   GFRAA 68 12/16/2015 1030    No results found for: SPEP, UPEP  Lab Results  Component Value Date   WBC 10.6 (H) 04/01/2020   NEUTROABS 7.1 04/01/2020    HGB 9.7 (L) 04/01/2020   HCT 29.3 (L) 04/01/2020   MCV 91.3 04/01/2020   PLT 765 (H) 04/01/2020      Chemistry      Component Value Date/Time   NA 137 04/01/2020 1033   NA 137 10/27/2011 0738   K 3.8 04/01/2020 1033   K 4.1 10/27/2011 0738   CL 100 04/01/2020 1033   CL 105 10/27/2011 0738   CO2 28 04/01/2020 1033   CO2 26 10/27/2011 0738   BUN 5 (L) 04/01/2020 1033   BUN 15 10/27/2011 0738   CREATININE 0.83 04/01/2020 1033   CREATININE 1.01 (H) 12/16/2015 1030   GLU 98 09/28/2013 0000      Component Value Date/Time   CALCIUM 9.6 04/01/2020 1033   CALCIUM 10.0 09/28/2013 0000   ALKPHOS 71 04/01/2020 1033   ALKPHOS 102 10/27/2011 0738   AST 37 04/01/2020 1033   ALT 9 04/01/2020 1033   ALT 29 10/27/2011 0738   BILITOT 0.4 04/01/2020 1033       RADIOGRAPHIC STUDIES: I reviewed multiple CT imaging with the patient and her  husband I have personally reviewed the radiological images as listed and agreed with the findings in the report. CT Abdomen Pelvis W Contrast  Result Date: 03/08/2020 CLINICAL DATA:  Recurrent ovarian carcinoma. Recent exploratory laparotomy with transverse loop colostomy. EXAM: CT ABDOMEN AND PELVIS WITH CONTRAST TECHNIQUE: Multidetector CT imaging of the abdomen and pelvis was performed using the standard protocol following bolus administration of intravenous contrast. CONTRAST:  166mL OMNIPAQUE IOHEXOL 300 MG/ML  SOLN COMPARISON:  CT scan 03/02/2020 FINDINGS: Lower chest: Trace pericardial effusions. Mild RIGHT basilar atelectasis. No pneumonia. Hepatobiliary: No focal hepatic lesion. Postcholecystectomy. No biliary dilatation. Pancreas: Pancreas is normal. No ductal dilatation. No pancreatic inflammation. Spleen: Normal spleen Adrenals/urinary tract: Adrenal glands normal. Persistent RIGHT hydronephrosis and hydroureter. Hydroureter to the level of the pelvic mass. Findings similar to CT 03/02/2020. Extrarenal pelvis on the LEFT. No ureteral dilatation.  Bladder is compressed anteriorly by pelvic mass. Small a gas within the bladder related to recent surgery/catheterization. Stomach/Bowel: No gastric distension. The stomach and duodenum normal. Small bowel normal. Terminal ileum and cecum normal. Appendix not identified. Ascending colon normal. Transverse colon exits a LEFT midline ostomy without complication. Vascular/Lymphatic: Abdominal aorta is normal caliber. No periportal or retroperitoneal adenopathy. No pelvic adenopathy. Reproductive: Large multilobulated pelvic mass again demonstrated. Example lobular component measures 8.6 x 8.2 cm (image 74/2) compared to 7.0 by 7.5 cm. More anterior lobular component measures 5.7 cm by 4.9 cm compared to 5.5 x 4.2 cm. In craniocaudad dimension cul-de-sac mass measures 11.8 cm compared to 9.6 cm. Other: Postsurgical change in the abdominal wall consistent laparoscopic surgery. No complicating features. Musculoskeletal: No aggressive osseous lesion. IMPRESSION: 1. No evidence of bowel obstruction.  Stomach small bowel normal. 2. Post laparoscopic transverse loop colostomy without complicating features. 3. Persistent moderate RIGHT ureteral obstruction. 4. Pelvic mass measures larger in short interval follow-up from 03/02/2020. Electronically Signed   By: Suzy Bouchard M.D.   On: 03/08/2020 15:20   CT CHEST ABDOMEN PELVIS W CONTRAST  Result Date: 04/01/2020 CLINICAL DATA:  Ovarian cancer diagnosed 12/20 and 5/21. Ongoing chemotherapy. Hysterectomy. Cholecystectomy. Colostomy. EXAM: CT CHEST, ABDOMEN, AND PELVIS WITH CONTRAST TECHNIQUE: Multidetector CT imaging of the chest, abdomen and pelvis was performed following the standard protocol during bolus administration of intravenous contrast. CONTRAST:  158mL OMNIPAQUE IOHEXOL 300 MG/ML  SOLN COMPARISON:  03/08/2020 abdominopelvic CT. Most recent chest CT 06/18/2019. FINDINGS: CT CHEST FINDINGS Cardiovascular: Aortic atherosclerosis. Normal heart size, without  pericardial effusion. No central pulmonary embolism, on this non-dedicated study. Right Port-A-Cath tip at mid to low SVC. Mediastinum/Nodes: No supraclavicular adenopathy. Small left axillary and subpectoral nodes are similar and likely reactive. Calcified mediastinal and right hilar nodes are likely related to old granulomatous disease. Small hiatal hernia. Mildly dilated, contrast filled esophagus including on 25/2. Lungs/Pleura: Tiny bilateral pleural effusions, larger on the left. Scattered calcified granulomas within both lungs. Musculoskeletal: No acute osseous abnormality. CT ABDOMEN PELVIS FINDINGS Hepatobiliary: Old granulomatous disease in the liver. Focal steatosis adjacent the falciform ligament. Cholecystectomy, without biliary ductal dilatation. Pancreas: Pancreatic atrophy, without duct dilatation or acute inflammation. Spleen: Old granulomatous disease in the spleen. Adrenals/Urinary Tract: Normal adrenal glands. Moderate right-sided hydroureteronephrosis is not significantly changed. Normal left kidney. Degraded evaluation of the pelvis, secondary to beam hardening artifact from right hip arthroplasty. Grossly normal urinary bladder. Stomach/Bowel: Normal remainder of the stomach. Transverse loop colostomy. Cecum is positioned within the midline. Normal terminal ileum. Normal small bowel caliber. Vascular/Lymphatic: Aortic atherosclerosis. Porta hepatis adenopathy at 1.8 cm on 61/2  versus 1.5 cm on the prior exam. This is newly enlarged compared back to 02/21/2020. Reproductive: Pelvic mass or masses again identified. Somewhat difficult to measure separately, given contiguous appearance. Posterior central pelvic mass measures 8.2 x 8.9 cm on 112/2 versus 8.3 x 0.6 cm on the prior exam (when remeasured). Right pelvic mass measures 5.9 x 4.3 cm on 113/2 versus 5.8 x 5.0 cm on the prior exam, suggesting stability. High right pelvic mass or area of loculated ascites new or more distinct at 4.1 x 3.5 cm  on 98/2. Other: Increased moderate volume abdominopelvic fluid. Extensive omental thickening without well-defined omental mass. Musculoskeletal: Right hip arthroplasty.  Lumbosacral spondylosis. IMPRESSION: CT CHEST IMPRESSION 1.  No acute process or evidence of metastatic disease in the chest. 2. Small hiatal hernia with dilated contrast filled esophagus, suggesting gastroesophageal reflux or esophageal dysmotility. 3. Tiny bilateral pleural effusions. CT ABDOMEN AND PELVIS IMPRESSION 1. Primarily similar tumor burden within the pelvis compared to 03/08/2020. New low-density lesion within the upper right hemipelvis could represent progressive disease or loculated ascites. 2. Increase in moderate volume abdominopelvic ascites. 3. Enlarging portacaval node, consistent with nodal metastasis. 4. Persistent moderate right-sided hydroureteronephrosis. 5.  Aortic Atherosclerosis (ICD10-I70.0). Electronically Signed   By: Abigail Miyamoto M.D.   On: 04/01/2020 17:54

## 2020-04-02 NOTE — Assessment & Plan Note (Signed)
We had numerous goals of care discussions in the past She has advanced directive and living will in place We discussed prognosis, likely measured in less than 3 months We discussed the role of palliative care with hospice and she is in agreement for enrollment I gave her a DNR order today

## 2020-04-02 NOTE — Assessment & Plan Note (Signed)
I sent prescription for lorazepam for her to take as needed She found that very helpful for her to cope

## 2020-04-02 NOTE — Assessment & Plan Note (Signed)
Her pain is well controlled with pain medications for now We discussed future pain medicine refills

## 2020-04-02 NOTE — Assessment & Plan Note (Signed)
I reviewed CT imaging with the patient and her husband Compared to CT imaging a month ago, there are new findings with slightly enlarged lymphadenopathy, moderate amount of ascites and stable size of her pelvic mass This is not indicative of failure of treatment.  CT imaging was ordered a bit sooner than planned because of her symptomatic shortness of breath and discomfort We discussed the risk and benefits of continuing chemotherapy Her quality of life has declined dramatically over the past few weeks Ultimately, she has made informed decision to stop palliative chemotherapy and to get enrolled in home-based palliative care with hospice I will cancel her treatment in the near future I have removed unnecessary long-term medications of her list I do not recommend port flushes on maintenance in the future

## 2020-04-02 NOTE — Telephone Encounter (Signed)
RN spoke with referrals at [place].  Referral initiated.

## 2020-04-03 DIAGNOSIS — Z48815 Encounter for surgical aftercare following surgery on the digestive system: Secondary | ICD-10-CM | POA: Diagnosis not present

## 2020-04-03 DIAGNOSIS — I10 Essential (primary) hypertension: Secondary | ICD-10-CM | POA: Diagnosis not present

## 2020-04-03 DIAGNOSIS — C561 Malignant neoplasm of right ovary: Secondary | ICD-10-CM | POA: Diagnosis not present

## 2020-04-03 DIAGNOSIS — F419 Anxiety disorder, unspecified: Secondary | ICD-10-CM | POA: Diagnosis not present

## 2020-04-03 DIAGNOSIS — Z433 Encounter for attention to colostomy: Secondary | ICD-10-CM | POA: Diagnosis not present

## 2020-04-03 DIAGNOSIS — D630 Anemia in neoplastic disease: Secondary | ICD-10-CM | POA: Diagnosis not present

## 2020-04-03 DIAGNOSIS — K5900 Constipation, unspecified: Secondary | ICD-10-CM | POA: Diagnosis not present

## 2020-04-03 DIAGNOSIS — F32A Depression, unspecified: Secondary | ICD-10-CM | POA: Diagnosis not present

## 2020-04-03 DIAGNOSIS — G893 Neoplasm related pain (acute) (chronic): Secondary | ICD-10-CM | POA: Diagnosis not present

## 2020-04-08 ENCOUNTER — Inpatient Hospital Stay: Payer: Medicare HMO

## 2020-04-08 ENCOUNTER — Inpatient Hospital Stay: Payer: Medicare HMO | Admitting: Hematology and Oncology

## 2020-04-14 ENCOUNTER — Ambulatory Visit: Payer: Medicare HMO | Admitting: Hematology and Oncology

## 2020-04-14 ENCOUNTER — Other Ambulatory Visit: Payer: Medicare HMO

## 2020-04-14 ENCOUNTER — Ambulatory Visit: Payer: Medicare HMO

## 2020-04-18 ENCOUNTER — Encounter (HOSPITAL_COMMUNITY): Payer: Self-pay

## 2020-05-02 ENCOUNTER — Telehealth: Payer: Self-pay

## 2020-05-17 NOTE — Telephone Encounter (Signed)
[facility] called. She passed this morning.

## 2020-05-17 DEATH — deceased

## 2021-06-18 IMAGING — XA IR IMAGING GUIDED PORT INSERTION
1 series · 1 of 1 positions shown · non-contrast
Comparison: None.

INDICATION: 68-year-old with peritoneal carcinomatosis. Port-A-Cath needed for
treatment.

EXAM:
FLUOROSCOPIC AND ULTRASOUND GUIDED PLACEMENT OF A SUBCUTANEOUS PORT

[Series 1: care single · 1 of 1 slices shown]
[im 1/1]
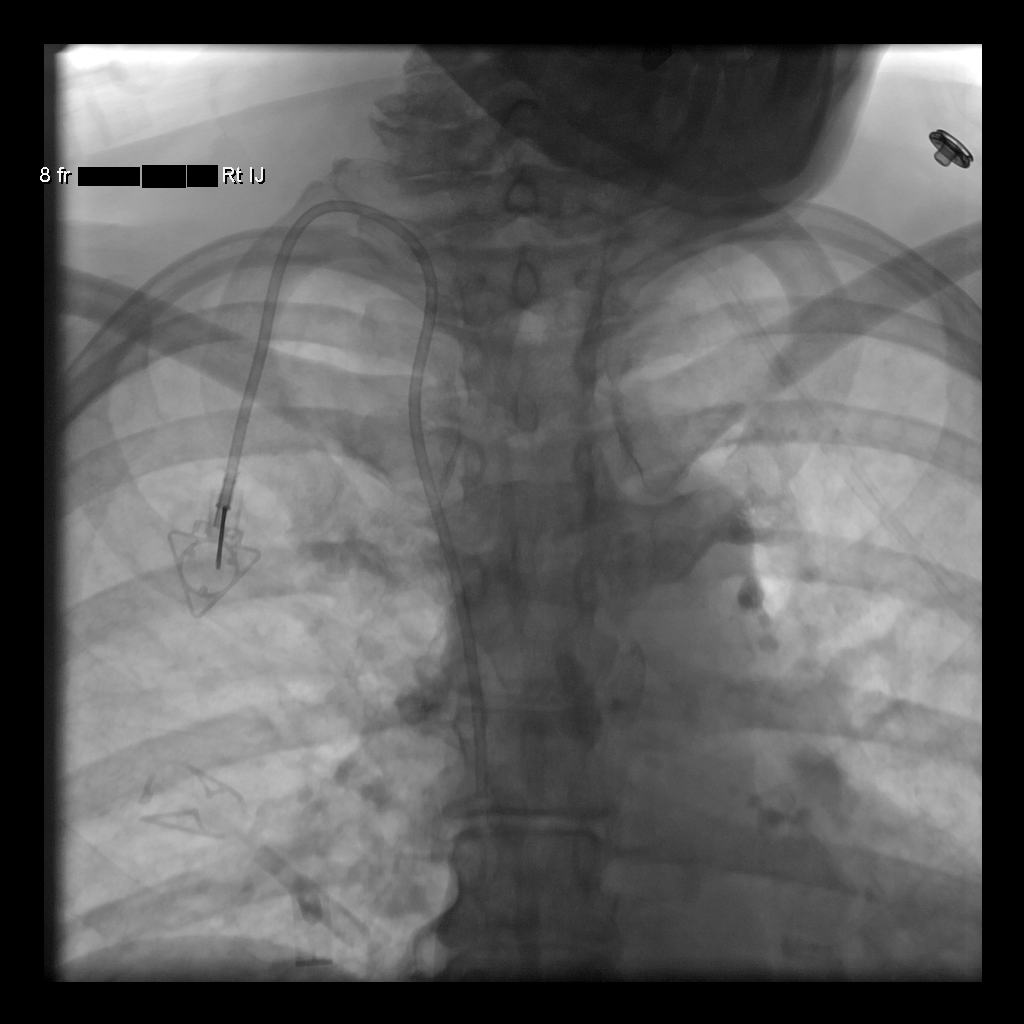

[1 of 1 positions shown; findings below may reference images not displayed]

MEDICATIONS:
Ancef 2 g; The antibiotic was administered within an appropriate
time interval prior to skin puncture.

ANESTHESIA/SEDATION:
Versed 4.0 mg IV; Fentanyl 100 mcg IV;

Moderate Sedation Time:  31 minutes

The patient was continuously monitored during the procedure by the
interventional radiology nurse under my direct supervision.

FLUOROSCOPY TIME:  36 seconds, 4 mGy

COMPLICATIONS:
None immediate.

PROCEDURE:
The procedure, risks, benefits, and alternatives were explained to
the patient. Questions regarding the procedure were encouraged and
answered. The patient understands and consents to the procedure.

Patient was placed supine on the interventional table. Ultrasound
confirmed a patent right internal jugular vein. Ultrasound image was
saved for documentation. The right chest and neck were cleaned with
a skin antiseptic and a sterile drape was placed. Maximal barrier
sterile technique was utilized including caps, mask, sterile gowns,
sterile gloves, sterile drape, hand hygiene and skin antiseptic. The
right neck was anesthetized with 1% lidocaine. Small incision was
made in the right neck with a blade. Micropuncture set was placed in
the right internal jugular vein with ultrasound guidance. The
micropuncture wire was used for measurement purposes. The right
chest was anesthetized with 1% lidocaine with epinephrine. #15 blade
was used to make an incision and a subcutaneous port pocket was
formed. 8 french Power Port was assembled. Subcutaneous tunnel was
formed with a stiff tunneling device. The port catheter was brought
through the subcutaneous tunnel. The port was placed in the
subcutaneous pocket and sutured in place. The micropuncture set was
exchanged for a peel-away sheath. The catheter was placed through
the peel-away sheath and the tip was positioned at the SVC and right
atrium junction. Catheter placement was confirmed with fluoroscopy.
The port was accessed and flushed with heparinized saline. The port
pocket was closed using two layers of absorbable sutures and
Deeqa Rayaan. The vein skin site was closed using a single layer of
absorbable suture and Deeqa Rayaan. Sterile dressings were applied.
Patient tolerated the procedure well without an immediate
complication. Ultrasound and fluoroscopic images were taken and
saved for this procedure.
IMPRESSION: Placement of a subcutaneous port device. Catheter tip at the SVC and
right atrium junction.

## 2022-03-22 IMAGING — DX DG ABDOMEN 2V
2 series · 2 of 2 positions shown · non-contrast
Comparison: 10/10/2019

CLINICAL DATA: Abdominal pain, history of ovarian carcinoma

EXAM:
ABDOMEN - 2 VIEW

[abdomen supine]
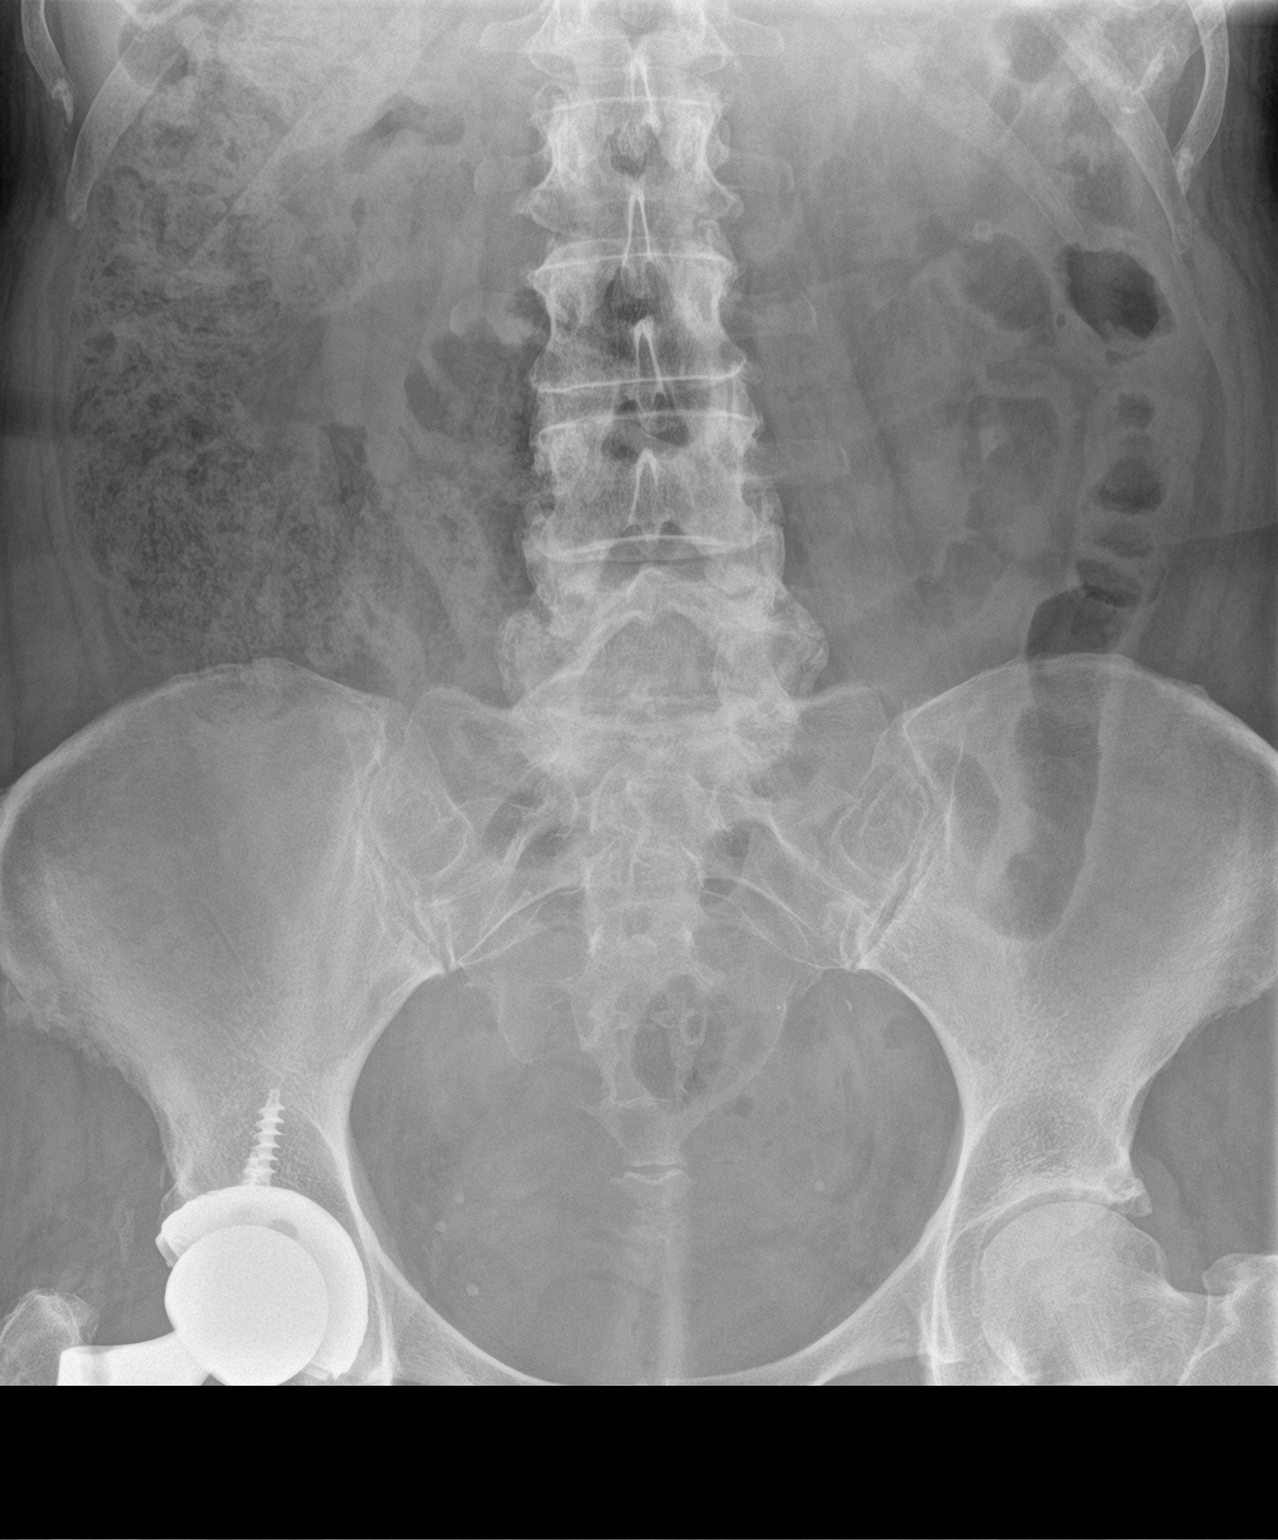

[abdomen erect]
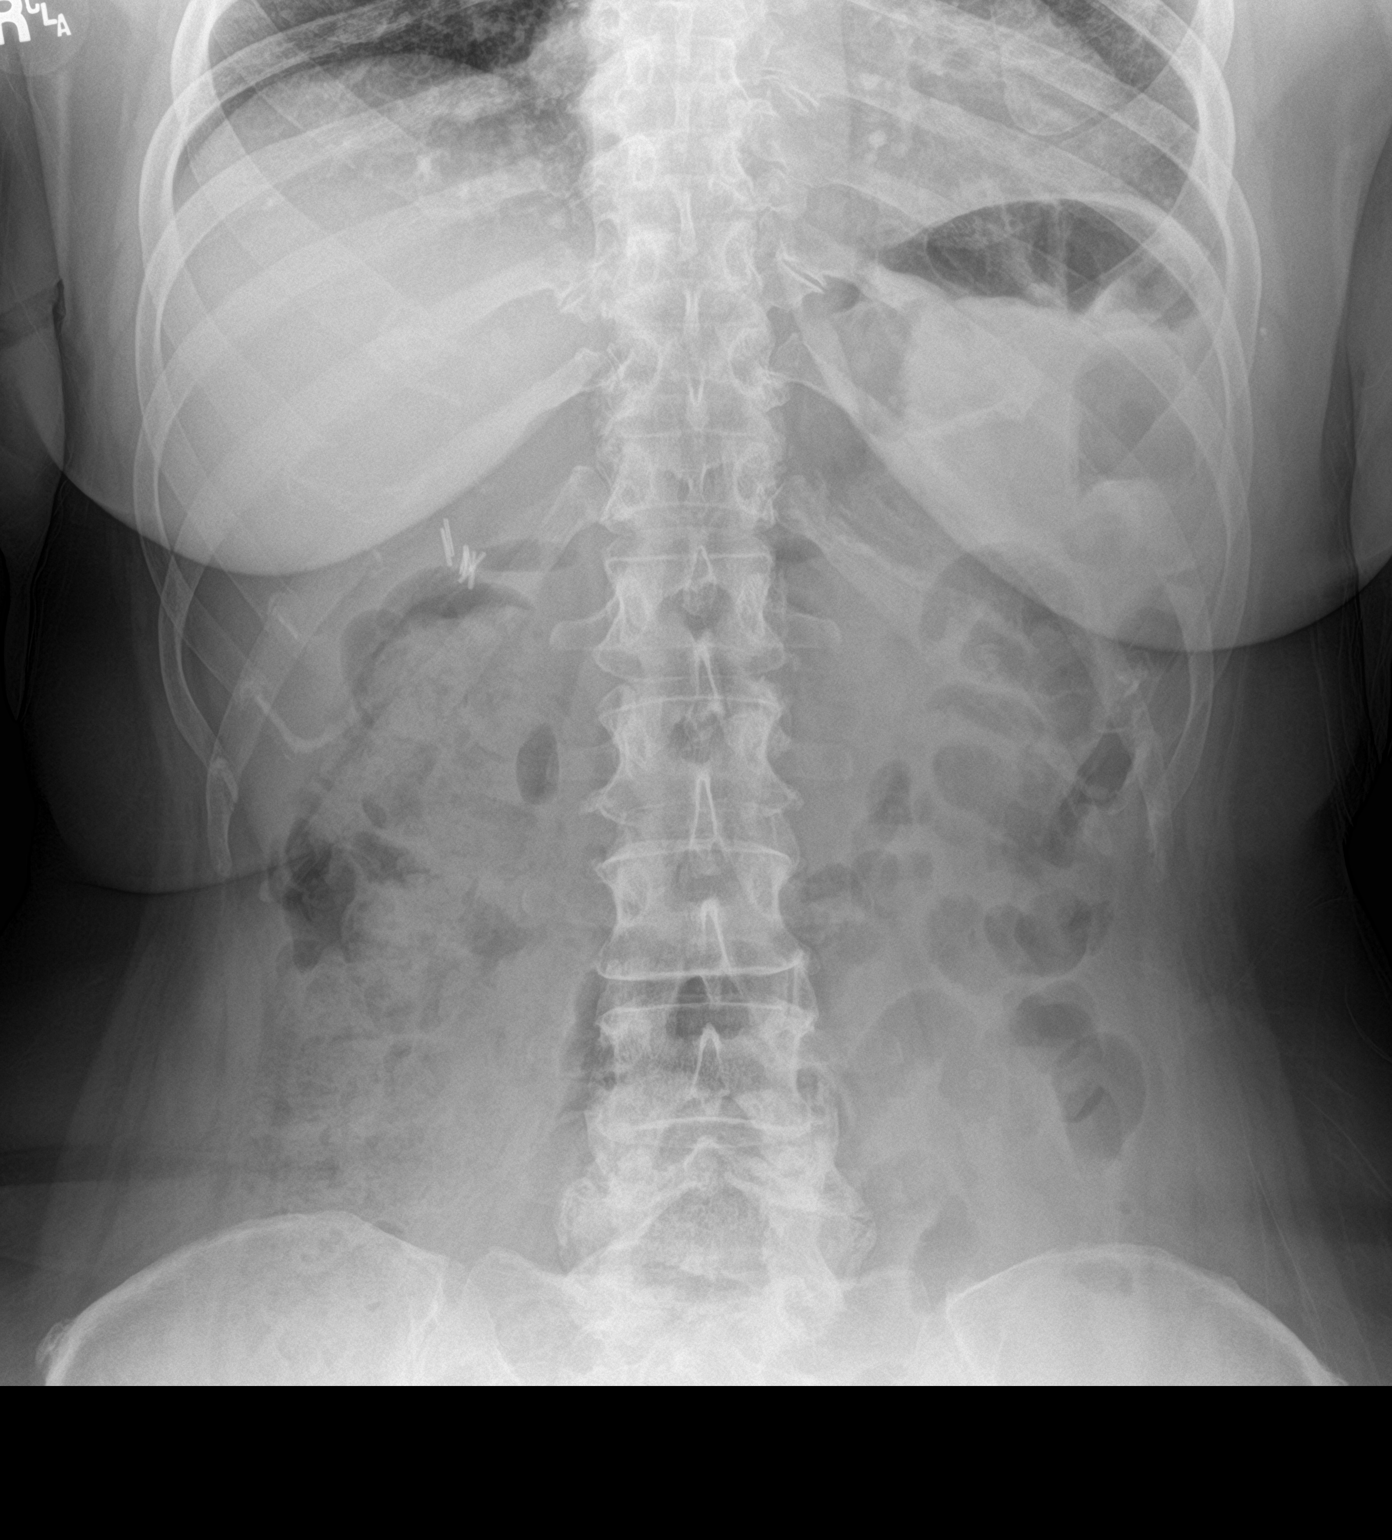

[2 of 2 positions shown; findings below may reference images not displayed]

FINDINGS: Scattered large and small bowel gas is noted. No abnormal mass or
abnormal calcifications are seen. Changes of prior right hip
replacement and cholecystectomy are noted. Mild degenerative changes
of lumbar spine are seen.
IMPRESSION: No acute abnormality noted.
